# Patient Record
Sex: Female | Born: 1960 | Race: Black or African American | Hispanic: No | Marital: Single | Smoking: Former smoker
Health system: Southern US, Community
[De-identification: ages and names within clinical notes are randomized; demographics above are authoritative.]

## PROBLEM LIST (undated history)

## (undated) DIAGNOSIS — J449 Chronic obstructive pulmonary disease, unspecified: Secondary | ICD-10-CM

## (undated) DIAGNOSIS — E785 Hyperlipidemia, unspecified: Secondary | ICD-10-CM

## (undated) DIAGNOSIS — I10 Essential (primary) hypertension: Principal | ICD-10-CM

## (undated) DIAGNOSIS — M1990 Unspecified osteoarthritis, unspecified site: Secondary | ICD-10-CM

## (undated) HISTORY — DX: Unspecified osteoarthritis, unspecified site: M19.90

## (undated) HISTORY — DX: Chronic obstructive pulmonary disease, unspecified: J44.9

## (undated) HISTORY — PX: BREAST BIOPSY: SHX20

## (undated) HISTORY — PX: BREAST EXCISIONAL BIOPSY: SUR124

## (undated) HISTORY — DX: Essential (primary) hypertension: I10

## (undated) HISTORY — PX: AXILLARY LYMPH NODE BIOPSY: SHX5737

## (undated) HISTORY — PX: DIAGNOSTIC LAPAROSCOPY: SUR761

## (undated) HISTORY — DX: Hyperlipidemia, unspecified: E78.5

---

## 1998-06-02 ENCOUNTER — Other Ambulatory Visit: Admission: RE | Admit: 1998-06-02 | Discharge: 1998-06-02 | Payer: Self-pay | Admitting: Family Medicine

## 1999-03-13 ENCOUNTER — Encounter: Payer: Self-pay | Admitting: Internal Medicine

## 1999-03-13 ENCOUNTER — Ambulatory Visit (HOSPITAL_COMMUNITY): Admission: RE | Admit: 1999-03-13 | Discharge: 1999-03-13 | Payer: Self-pay | Admitting: Internal Medicine

## 2000-05-23 ENCOUNTER — Other Ambulatory Visit: Admission: RE | Admit: 2000-05-23 | Discharge: 2000-05-23 | Payer: Self-pay | Admitting: Family Medicine

## 2001-02-03 ENCOUNTER — Encounter: Payer: Self-pay | Admitting: Family Medicine

## 2001-02-03 ENCOUNTER — Encounter: Admission: RE | Admit: 2001-02-03 | Discharge: 2001-02-03 | Payer: Self-pay | Admitting: Family Medicine

## 2002-01-29 ENCOUNTER — Other Ambulatory Visit: Admission: RE | Admit: 2002-01-29 | Discharge: 2002-01-29 | Payer: Self-pay | Admitting: Family Medicine

## 2002-01-31 ENCOUNTER — Encounter: Payer: Self-pay | Admitting: Family Medicine

## 2002-01-31 ENCOUNTER — Encounter: Admission: RE | Admit: 2002-01-31 | Discharge: 2002-01-31 | Payer: Self-pay | Admitting: Family Medicine

## 2003-04-13 DIAGNOSIS — I10 Essential (primary) hypertension: Secondary | ICD-10-CM

## 2003-04-13 HISTORY — DX: Essential (primary) hypertension: I10

## 2003-06-17 ENCOUNTER — Emergency Department (HOSPITAL_COMMUNITY): Admission: AD | Admit: 2003-06-17 | Discharge: 2003-06-17 | Payer: Self-pay | Admitting: Family Medicine

## 2003-07-03 ENCOUNTER — Encounter: Admission: RE | Admit: 2003-07-03 | Discharge: 2003-07-03 | Payer: Self-pay | Admitting: Family Medicine

## 2003-07-11 ENCOUNTER — Other Ambulatory Visit: Admission: RE | Admit: 2003-07-11 | Discharge: 2003-07-11 | Payer: Self-pay | Admitting: Family Medicine

## 2003-12-31 ENCOUNTER — Ambulatory Visit: Payer: Self-pay | Admitting: Nurse Practitioner

## 2004-08-25 ENCOUNTER — Ambulatory Visit: Payer: Self-pay | Admitting: Nurse Practitioner

## 2005-02-05 ENCOUNTER — Ambulatory Visit: Payer: Self-pay | Admitting: Nurse Practitioner

## 2005-07-17 ENCOUNTER — Emergency Department (HOSPITAL_COMMUNITY): Admission: EM | Admit: 2005-07-17 | Discharge: 2005-07-17 | Payer: Self-pay | Admitting: Family Medicine

## 2005-07-19 ENCOUNTER — Ambulatory Visit: Payer: Self-pay | Admitting: Nurse Practitioner

## 2005-07-25 ENCOUNTER — Emergency Department (HOSPITAL_COMMUNITY): Admission: EM | Admit: 2005-07-25 | Discharge: 2005-07-25 | Payer: Self-pay | Admitting: Family Medicine

## 2005-08-10 ENCOUNTER — Encounter: Admission: RE | Admit: 2005-08-10 | Discharge: 2005-08-10 | Payer: Self-pay | Admitting: Family Medicine

## 2005-11-16 ENCOUNTER — Ambulatory Visit: Payer: Self-pay | Admitting: Nurse Practitioner

## 2005-11-16 ENCOUNTER — Encounter (INDEPENDENT_AMBULATORY_CARE_PROVIDER_SITE_OTHER): Payer: Self-pay | Admitting: *Deleted

## 2005-11-30 ENCOUNTER — Ambulatory Visit: Payer: Self-pay | Admitting: Nurse Practitioner

## 2006-07-06 ENCOUNTER — Ambulatory Visit: Payer: Self-pay | Admitting: Nurse Practitioner

## 2006-07-08 ENCOUNTER — Ambulatory Visit (HOSPITAL_COMMUNITY): Admission: RE | Admit: 2006-07-08 | Discharge: 2006-07-08 | Payer: Self-pay | Admitting: Family Medicine

## 2006-07-19 ENCOUNTER — Ambulatory Visit (HOSPITAL_COMMUNITY): Admission: RE | Admit: 2006-07-19 | Discharge: 2006-07-19 | Payer: Self-pay | Admitting: Family Medicine

## 2006-08-05 ENCOUNTER — Ambulatory Visit: Payer: Self-pay | Admitting: Nurse Practitioner

## 2006-08-16 ENCOUNTER — Encounter: Admission: RE | Admit: 2006-08-16 | Discharge: 2006-08-16 | Payer: Self-pay | Admitting: Family Medicine

## 2006-10-03 ENCOUNTER — Ambulatory Visit (HOSPITAL_COMMUNITY): Admission: RE | Admit: 2006-10-03 | Discharge: 2006-10-03 | Payer: Self-pay | Admitting: Nurse Practitioner

## 2006-11-17 ENCOUNTER — Ambulatory Visit: Payer: Self-pay | Admitting: Family Medicine

## 2006-11-17 ENCOUNTER — Encounter: Payer: Self-pay | Admitting: Family Medicine

## 2006-11-29 ENCOUNTER — Ambulatory Visit (HOSPITAL_COMMUNITY): Admission: RE | Admit: 2006-11-29 | Discharge: 2006-11-29 | Payer: Self-pay | Admitting: Obstetrics & Gynecology

## 2006-12-26 ENCOUNTER — Ambulatory Visit (HOSPITAL_COMMUNITY): Admission: RE | Admit: 2006-12-26 | Discharge: 2006-12-26 | Payer: Self-pay | Admitting: Family Medicine

## 2006-12-26 ENCOUNTER — Ambulatory Visit: Payer: Self-pay | Admitting: Family Medicine

## 2006-12-28 ENCOUNTER — Ambulatory Visit: Payer: Self-pay | Admitting: Internal Medicine

## 2007-01-11 ENCOUNTER — Ambulatory Visit: Payer: Self-pay | Admitting: Obstetrics and Gynecology

## 2007-01-12 ENCOUNTER — Ambulatory Visit: Payer: Self-pay | Admitting: Family Medicine

## 2007-02-13 ENCOUNTER — Ambulatory Visit: Payer: Self-pay | Admitting: Family Medicine

## 2007-02-27 ENCOUNTER — Ambulatory Visit: Payer: Self-pay | Admitting: Internal Medicine

## 2007-05-26 ENCOUNTER — Ambulatory Visit: Payer: Self-pay | Admitting: Family Medicine

## 2007-07-25 ENCOUNTER — Ambulatory Visit: Payer: Self-pay | Admitting: Internal Medicine

## 2007-10-02 ENCOUNTER — Encounter: Admission: RE | Admit: 2007-10-02 | Discharge: 2007-10-02 | Payer: Self-pay | Admitting: Occupational Therapy

## 2007-11-07 ENCOUNTER — Ambulatory Visit: Payer: Self-pay | Admitting: Internal Medicine

## 2007-11-08 ENCOUNTER — Encounter (INDEPENDENT_AMBULATORY_CARE_PROVIDER_SITE_OTHER): Payer: Self-pay | Admitting: Family Medicine

## 2007-11-08 LAB — CONVERTED CEMR LAB: Barbiturate Quant, Ur: NEGATIVE

## 2008-01-10 ENCOUNTER — Ambulatory Visit: Payer: Self-pay | Admitting: *Deleted

## 2008-01-22 ENCOUNTER — Ambulatory Visit: Payer: Self-pay | Admitting: Internal Medicine

## 2008-08-15 ENCOUNTER — Ambulatory Visit: Payer: Self-pay | Admitting: Internal Medicine

## 2008-08-19 ENCOUNTER — Ambulatory Visit: Payer: Self-pay | Admitting: Family Medicine

## 2008-10-21 ENCOUNTER — Ambulatory Visit: Payer: Self-pay | Admitting: Internal Medicine

## 2008-10-28 ENCOUNTER — Ambulatory Visit: Payer: Self-pay | Admitting: Internal Medicine

## 2008-10-31 ENCOUNTER — Encounter: Admission: RE | Admit: 2008-10-31 | Discharge: 2008-10-31 | Payer: Self-pay | Admitting: Family Medicine

## 2009-02-21 ENCOUNTER — Encounter (INDEPENDENT_AMBULATORY_CARE_PROVIDER_SITE_OTHER): Payer: Self-pay | Admitting: Internal Medicine

## 2009-02-21 ENCOUNTER — Ambulatory Visit: Payer: Self-pay | Admitting: Internal Medicine

## 2009-02-21 LAB — CONVERTED CEMR LAB
ALT: 12 units/L (ref 0–35)
Alkaline Phosphatase: 88 units/L (ref 39–117)
BUN: 19 mg/dL (ref 6–23)
Chloride: 105 meq/L (ref 96–112)
Creatinine, Ser: 0.84 mg/dL (ref 0.40–1.20)
Potassium: 4.5 meq/L (ref 3.5–5.3)
Total Protein: 7.4 g/dL (ref 6.0–8.3)

## 2009-06-19 ENCOUNTER — Ambulatory Visit: Payer: Self-pay | Admitting: Internal Medicine

## 2009-09-24 ENCOUNTER — Ambulatory Visit: Payer: Self-pay | Admitting: Internal Medicine

## 2009-11-28 ENCOUNTER — Encounter: Admission: RE | Admit: 2009-11-28 | Discharge: 2009-11-28 | Payer: Self-pay | Admitting: Family Medicine

## 2010-03-24 ENCOUNTER — Ambulatory Visit: Payer: Self-pay | Admitting: Family Medicine

## 2010-05-03 ENCOUNTER — Encounter: Payer: Self-pay | Admitting: *Deleted

## 2010-08-25 NOTE — Op Note (Signed)
NAMECORALEE, [name]               ACCOUNT NO.:  [id number]   MEDICAL RECORD NO.:  [id number]          PATIENT TYPE:  AMB   LOCATION:  [facility]                           [facility]:  [facility]   PHYSICIAN:  [name], M.D.   DATE OF BIRTH:  03/07/1961   DATE OF PROCEDURE:  12/26/2006  DATE OF DISCHARGE:                               OPERATIVE REPORT   PREOPERATIVE DIAGNOSIS:  Persistent ovarian cyst on the right.   POSTOPERATIVE DIAGNOSES:  1. Persistent ovarian cyst on the right.  2. Extensive pelvic adhesive disease.   SURGEON:  [name]. Shawnie Pons, MD.   ASSISTANTMichele Harvey D. [name], M.D.   PROCEDURE:  Diagnostic laparoscopy.   ANESTHESIA:  General.   FINDINGS:  Extensive adhesions and a large ovarian cyst.   SPECIMENS:  None.   ESTIMATED BLOOD LOSS:  Minimal.   COMPLICATIONS:  None immediately known.   REASON FOR PROCEDURE:  Briefly the patient is a 50 year old, gravida 3,  para 2-0-1-2, who has a history of a previous C-section and a lumpectomy  of the breast, but no other abdominal surgeries, who had a persistent  ovarian cyst initially found on an MRI and then a followup ultrasound  revealed that it was still there.  She is having a little bit of pain on  the right side and desired definitive treatment.  Although the cyst  appeared simple, the fact that it was persistent led [place] to take her to  diagnostic laparoscopy.  Additionally the patient is a smoker and a bit  of a chronic alcohol user.   PROCEDURE:  The patient was taken to the OR, she was placed in dorsal  lithotomy in Allen stirrups, she was prepped and draped in the usual  sterile fashion.  A catheter was used to drain her bladder, a speculum  was then placed inside the vagina, the cervix was visualized and grasped  with a single tooth tenaculum anteriorly, and a Hulka tenaculum was  placed, the single tooth tenaculum and the speculum were removed from  the vagina.  Attention was then turned to the abdomen, the  umbilicus was  injected with 5 ml of 0.25% Marcaine plain, a knife was used to make an  incision through the umbilicus, was carried down until the peritoneal  cavity was entered sharply.  A ___________ suture on a UR-6 was then  used to tag either side of the fascia of this incision, and a Hassan  trocar was placed through the incision.  A pneumoperitoneum was created  and the scope was used to look in.  Immediately upon entering the  abdomen, a dense set of adhesions of omentum were noted to be all along  the anterior abdominal wall.  This extended from the umbilicus all the  way down to the pelvis.  By going to one side of this adhesion on the  left, the patient's uterus left tube, which had previously had a tubal  ligation and the left ovary could be seen following the underside of the  uterus, eventually the other ovary was seen and was noted to be fairly  enlarged, but the bowel was directly below it and really could not be  raised up much above that and there was no safe way to remove this  ovary.  Additionally the ovary was attached anteriorly to the anterior  abdominal wall and possibly to the uterus as well, the pelvic sidewall  could not be sufficiently seen to be sure that this was clear nor could  the ureter have been identified on the side.  For all these reasons, the  decision was made to abort the procedure.  The patient had not  previously been consented for exploratory laparotomy, it was unclear how  long she had asked off for work and how much time would be available to  her.  Additionally it was felt that a hysterectomy may be in her best  interest given the amount of adhesions and her undesired fertility,  which could be done did we have time to prep for this.  Additionally  given the location of bowel, the density of the adhesions, it was felt  that the patient would best be served with a prior bowel prep as well as  a getting a PT-PTT given her history of chronic  alcohol use and just  making sure that everything else was normal before undergoing major  surgery, which would include the availability of blood products as well  as clotting factors if necessary.  Once this decision was made, the  pneumoperitoneum was let down, the [facility] trocar and all instruments  were removed from the abdomen, the fascia at the umbilicus was closed  with two figure-of-eight, the skin was closed with a #4-0 Vicryl in a  running subcuticular fashion.  All instrument and lap counts were  correct x2.  The Hulka tenaculum was removed from the vagina, the  patient was awakened and taken to recovery in stable condition.      [name]. Shawnie Harvey, M.D.  Electronically Signed     TSP/MEDQ  D:  12/26/2006  T:  12/26/2006  Job:  [id number]

## 2010-08-25 NOTE — Group Therapy Note (Signed)
[name], [name] NO.:  [id number]   MEDICAL RECORD NO.:  [id number]          PATIENT TYPE:  WOC   LOCATION:  [facility]                   FACILITY:  [facility]   PHYSICIAN:  Argentina Donovan, MD        DATE OF BIRTH:  05/08/1960   DATE OF SERVICE:  01/11/2007                                  CLINIC NOTE   The patient is a 50 year old African American female gravida 3, para 2-0-  1-2 who underwent laparoscopic examination on September 15 for  persistent ovarian cyst.  The patient had extensive pelvic adhesions  with a simple cyst on the ovary, and the patient had not been consulted  about further surgery.  We decided to abort the procedure at that point.  The patient has healed up well.  She is completely asymptomatic.  I told  her that, unless she became symptomatic, that she really should not  consider anything, but whoever has to go in there should do it through a  laparotomy.   IMPRESSION:  Persistent ovarian cyst.           ______________________________  Argentina Donovan, MD     PR/MEDQ  D:  01/11/2007  T:  01/12/2007  Job:  [id number]

## 2010-08-25 NOTE — Group Therapy Note (Signed)
NAMEBRAYLYNN, [name]               ACCOUNT NO.:  [id number]   MEDICAL RECORD NO.:  [id number]          PATIENT TYPE:  WOC   LOCATION:  [facility]                   FACILITY:  [facility]   PHYSICIAN:  Tinnie Gens, MD        DATE OF BIRTH:  1960/09/24   DATE OF SERVICE:  11/17/2006                                  CLINIC NOTE   CHIEF COMPLAINT:  Evaluate and treat adnexal cyst.   HISTORY OF PRESENT ILLNESS:  The patient is a 50 year old gravida 3,  para 2-0-1-2 who is referred from the outpatient clinic for a persistent  ovarian cyst.  Apparently the patient underwent MRI.  The cyst was found  on the right side, and she had a follow-up ultrasound.  The cyst was  persistent for approximately 2-3 months so she was referred here.  The  cyst looks simple in nature, but there may be a more complex right  adnexal mass.   PAST MEDICAL HISTORY:  Hypertension.   PAST SURGICAL HISTORY:  C-section x1, breast and axillary lumpectomy.   MEDICATIONS:  Lisinopril one daily.   ALLERGIES:  None known __________  .   OBSTETRICAL HISTORY:  She is a G3, P2, one miscarriage, one vaginal  delivery, one C-section __________  fetal heart rate tracing.   GYNECOLOGICAL HISTORY:  Menarche at age 81.  Cycles have been regular in  the past, but over the last six months she has skipped some months  without having a period.  Her cycles have become lighter.  She is having  some hot flashes as well.  She uses condoms for birth control.  She has  history of abnormal Pap in 1987, but her last Pap was July of last year  and was normal.  She had a mammogram in April 2008 also normal.   FAMILY HISTORY:  Diabetes, heart disease, prostate cancer in her father.   SOCIAL HISTORY:  She works for [employer] in [employer].  She  does smoke approximately 1/2 pack per day for the last 25 years.  She  does drink alcohol approximately three beers a day; no other drug use.   REVIEW OF SYSTEMS:  __________  , please see  GYN history on the chart.  Positive for night sweats and hot flashes.   PHYSICAL EXAMINATION:  GENERAL:  She is a thin black female in no acute  distress.  Looks stated age.  HEENT:  Normocephalic, atraumatic.  Sclerae anicteric.  NECK:  Supple.  Normal thyroid.  LUNGS:  Clear bilaterally.  CV:  Regular rate and rhythm.  No murmurs, rubs, gallops.  ABDOMEN:  Soft, nontender, nondistended.  EXTREMITIES:  No cyanosis, clubbing or edema, 2+ distal pulses.  GU:  Normal external female genitalia, BUS was normal.  Vagina is pink  and rugated.  Cervix is nulliparous in appearance without lesion.  Uterus is small, anteverted, left adnexa is without mass or tenderness;  right adnexa is without tenderness, but there is fullness on that side.   RADIOLOGY REVIEW:  Pelvic sonogram from October 03, 2006 reveals a simple  right adnexal cyst.  This is 6.8  x 3.7 x 3.7 cm and a second slightly  complex area of 2.1 x 2.2 x 2.8 cm.  Mesenteric cyst is a consideration.   IMPRESSION:  1. Persistent adnexal cyst.  2. Hypertension.  3. Tobacco use.  4. Current alcohol use.   PLAN:  Will plan for follow-up ultrasound in two weeks followed by  diagnostic laparoscopy with removal of said cyst and possibly removal of  that right ovary depending on what it looks like.  Would probably send  assessment for frozen if worrisome.           ______________________________  Tinnie Gens, MD     TP/MEDQ  D:  11/17/2006  T:  11/18/2006  Job:  [id number]

## 2010-11-03 ENCOUNTER — Other Ambulatory Visit: Payer: Self-pay | Admitting: Family Medicine

## 2010-11-03 DIAGNOSIS — Z1231 Encounter for screening mammogram for malignant neoplasm of breast: Secondary | ICD-10-CM

## 2010-12-03 ENCOUNTER — Ambulatory Visit: Payer: Self-pay

## 2010-12-07 ENCOUNTER — Ambulatory Visit: Payer: Self-pay

## 2011-01-21 LAB — CBC
HCT: 35 — ABNORMAL LOW
RBC: 3.49 — ABNORMAL LOW
WBC: 5.8

## 2011-01-21 LAB — COMPREHENSIVE METABOLIC PANEL
Albumin: 4
Chloride: 106
GFR calc Af Amer: >60
GFR calc non Af Amer: >60
Glucose, Bld: 108 — ABNORMAL HIGH
Potassium: 4.1

## 2011-03-30 ENCOUNTER — Other Ambulatory Visit: Payer: Self-pay | Admitting: Family Medicine

## 2012-03-08 ENCOUNTER — Encounter: Payer: Self-pay | Admitting: Family Medicine

## 2012-03-08 ENCOUNTER — Ambulatory Visit (INDEPENDENT_AMBULATORY_CARE_PROVIDER_SITE_OTHER): Payer: BC Managed Care – PPO | Admitting: Family Medicine

## 2012-03-08 VITALS — BP 125/69 | HR 90 | Temp 98.1°F | Ht 68.0 in | Wt 112.6 lb

## 2012-03-08 DIAGNOSIS — I10 Essential (primary) hypertension: Secondary | ICD-10-CM

## 2012-03-08 DIAGNOSIS — M79673 Pain in unspecified foot: Secondary | ICD-10-CM | POA: Insufficient documentation

## 2012-03-08 DIAGNOSIS — M79609 Pain in unspecified limb: Secondary | ICD-10-CM

## 2012-03-08 DIAGNOSIS — M542 Cervicalgia: Secondary | ICD-10-CM | POA: Insufficient documentation

## 2012-03-08 MED ORDER — CYCLOBENZAPRINE HCL 10 MG PO TABS: 10.0000 mg | ORAL_TABLET | Freq: Every evening | ORAL | Status: DC | PRN

## 2012-03-08 MED ORDER — LISINOPRIL-HYDROCHLOROTHIAZIDE 20-25 MG PO TABS: 1.0000 | ORAL_TABLET | Freq: Every day | ORAL | Status: DC

## 2012-03-08 NOTE — Progress Notes (Signed)
Patient ID: [name], female   DOB: February 11, 1961, 51 y.o.   MRN: [id number] Subjective: The patient is a 52 y.o. year old female who presents today for new patient appointment.  1. Neck stiffness:  Last 3-4 months has been noticing.  Tightness in back of neck, more on right.  Worse at night.  Made somewhat better by tylenol.  No radiation into arms, no numbness or tingling.  2. Foot pain:  Bilateral feet.  Present daily, worse with standing.  Not present when wakes up.  No numbness or tingling.  No weakness.  3. Blood pressure:  Takes meds daily.  No cp/sob.  Needs refills on meds.  Denies any LE edema.  Patient's past medical, social, and family history were reviewed and updated as appropriate. History  Substance Use Topics  . Smoking status: Not on file  . Smokeless tobacco: Not on file  . Alcohol Use: Not on file   Objective:  Filed Vitals:   03/08/12 1634  BP: 125/69  Pulse: 90  Temp: 98.1 F (36.7 C)   Gen: NAD, appropriate throughout exam HEENT: Neck is without any obvious muscle spasm at this time.  Mildly tender throughout. CV: RRR, no murmurs Resp: CTABL Ext: No edema, strength 5/5 all 4 extremities.  No decrease in sensation any extremity.  Assessment/Plan:  Please also see individual problems in problem list for problem-specific plans.

## 2012-03-08 NOTE — Patient Instructions (Signed)
Make an appointment to get your labs drawn first thing in the morning some morning when you can manage it with work. Try to stretch your neck throughout the day. I am giving you a muscle relaxer for days when your neck is particularly bad.  Do not take it and drive, do not take it with alcohol, and do not take it at work as it will make you sleepy.

## 2012-03-20 NOTE — Assessment & Plan Note (Signed)
Well controlled currently.  Refill meds.

## 2012-03-20 NOTE — Assessment & Plan Note (Signed)
Unclear etiology and, currently, patient is more concerned with neck pain.  Foot pain is not caused by cellulitis, does not appear to related to DVT, and is not related to acute nerve compression so can defer this until a later date.

## 2012-03-20 NOTE — Assessment & Plan Note (Signed)
Likely MSK in origin.  Discussed appropriate neck position when sleeping, periodic stretching, and avoiding being in the same position for extended periods of time.  Flexeril for pain in the evening.  Discussed side effect of sedation with patient.

## 2012-04-13 ENCOUNTER — Other Ambulatory Visit: Payer: BC Managed Care – PPO

## 2012-04-13 DIAGNOSIS — I10 Essential (primary) hypertension: Secondary | ICD-10-CM

## 2012-04-13 NOTE — Progress Notes (Signed)
BMP,CBC WITH DIFF,TSH AND FLP DONE TODAY [name]

## 2012-04-14 LAB — CBC WITH DIFFERENTIAL/PLATELET
Basophils Relative: 1 % (ref 0–1)
Eosinophils Absolute: 0.1 10*3/uL (ref 0.0–0.7)
Eosinophils Relative: 1 % (ref 0–5)
HCT: 35.8 % — ABNORMAL LOW (ref 36.0–46.0)
Hemoglobin: 12 g/dL (ref 12.0–15.0)
MCH: 33.1 pg (ref 26.0–34.0)
MCHC: 33.5 g/dL (ref 30.0–36.0)
Monocytes Absolute: 0.5 10*3/uL (ref 0.1–1.0)
Monocytes Relative: 9 % (ref 3–12)

## 2012-04-14 LAB — LIPID PANEL
Cholesterol: 212 mg/dL — ABNORMAL HIGH (ref 0–200)
Total CHOL/HDL Ratio: 2.5 Ratio
VLDL: 12 mg/dL (ref 0–40)

## 2012-04-14 LAB — BASIC METABOLIC PANEL
CO2: 27 mEq/L (ref 19–32)
Chloride: 98 mEq/L (ref 96–112)
Sodium: 136 mEq/L (ref 135–145)

## 2012-04-24 ENCOUNTER — Encounter: Payer: Self-pay | Admitting: Family Medicine

## 2012-12-17 ENCOUNTER — Emergency Department (INDEPENDENT_AMBULATORY_CARE_PROVIDER_SITE_OTHER): Payer: BC Managed Care – PPO

## 2012-12-17 ENCOUNTER — Emergency Department (INDEPENDENT_AMBULATORY_CARE_PROVIDER_SITE_OTHER)
Admission: EM | Admit: 2012-12-17 | Discharge: 2012-12-17 | Disposition: A | Discharge status: Home / Self Care | Payer: BC Managed Care – PPO | Source: Home / Self Care | Attending: Emergency Medicine | Admitting: Emergency Medicine

## 2012-12-17 ENCOUNTER — Encounter (HOSPITAL_COMMUNITY): Payer: Self-pay | Admitting: *Deleted

## 2012-12-17 DIAGNOSIS — M7581 Other shoulder lesions, right shoulder: Secondary | ICD-10-CM

## 2012-12-17 DIAGNOSIS — M67919 Unspecified disorder of synovium and tendon, unspecified shoulder: Secondary | ICD-10-CM

## 2012-12-17 MED ORDER — IBUPROFEN 800 MG PO TABS
ORAL_TABLET | ORAL | Status: AC
  Filled 2012-12-17: qty 1

## 2012-12-17 MED ORDER — MELOXICAM 15 MG PO TABS: 15.0000 mg | ORAL_TABLET | Freq: Every day | ORAL | Status: DC

## 2012-12-17 MED ORDER — IBUPROFEN 800 MG PO TABS
800.0000 mg | ORAL_TABLET | Freq: Once | ORAL | Status: AC
  Administered 2012-12-17: 800 mg via ORAL

## 2012-12-17 MED ORDER — OXYCODONE-ACETAMINOPHEN 5-325 MG PO TABS: ORAL_TABLET | ORAL | Status: DC

## 2012-12-17 NOTE — ED Notes (Signed)
Pt reports  Pain  r  Arm  From the  Shoulder  Down  X  sev  Days    Pt  denys  Any  Injury  She  States  She  Woke  Up  With  The  Pain

## 2012-12-17 NOTE — ED Notes (Signed)
Med  r   Arm  Sling  applied 

## 2012-12-17 NOTE — ED Provider Notes (Signed)
Chief Complaint:   Chief Complaint  Patient presents with  . 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 She S. with a three-day history of pain that seems centered around her right shoulder radiates up into the trapezius ridge and down her entire arm as far as the palm of the hand. She denies any swelling of the arm. The palm of her hand feels numb and tingly in the whole arm feels weak. There is pain on movement of the shoulder, abduction, flexion, and internal and external rotation. She denies any injury. She does lift heavy packages at work. The pain is worse with any movement of her shoulder and also if she lies on her side. Her neck has good range of motion with minimal pain. She has minor pain in her volar wrist and the palm of the hand. She denies ever having anything like this before. No history of arthritis, tendinitis, or bursitis.  Review of Systems:  Other than noted above, the patient denies any of the following symptoms: Systemic:  No fevers, chills, sweats, or aches.  No fatigue or tiredness. Musculoskeletal:  No joint pain, arthritis, bursitis, swelling, back pain, or neck pain. Neurological:  No muscular weakness, paresthesias, headache, or trouble with speech or coordination.  No dizziness.  PMFSH:  Past medical history, family history, social history, meds, and allergies were reviewed.  She has high blood pressure and takes lisinopril.  Physical Exam:   Vital signs:  BP 142/86  Pulse 72  Temp(Src) 98.6 F (37 C) (Oral)  Resp 16  SpO2 100% Gen:  Alert and oriented times 3.  In no distress. Musculoskeletal: There is pain to palpation in the trapezius ridge, over the anterior and posterior aspects of the shoulder. Then shoulder has a limited range of motion with pain on abduction, flexion, and internal and external rotation. There was no swelling of the arm. There is no pain to palpation in  the upper arm, elbow, antecubital fossa, or forearm. She does have some pain to palpation over the carpal tunnel and over the palm of the hand. Neer test was positive.  Hawkins test was positive.  Empty cans test was positive. Otherwise, all joints had a full a ROM with no swelling, bruising or deformity.  No edema, pulses full. Extremities were warm and pink.  Capillary refill was brisk.  Skin:  Clear, warm and dry.  No rash. Neuro:  Alert and oriented times 3.  Muscle strength was normal.  Sensation was intact to light touch.   Radiology:  Dg Shoulder Right  12/17/2012   *RADIOLOGY REPORT*  Clinical Data: Right shoulder pain for 3 days, no known injury  RIGHT SHOULDER - 2+ VIEW  Comparison: None.  Findings: No fracture or dislocation.  Glenohumeral and acromioclavicular joint spaces are preserved.  No evidence of calcific tendonitis.  Limited visualization adjacent thorax is normal.  Regional soft tissues are normal.  IMPRESSION: Normal radiographs the right shoulder.   Original Report Authenticated By: Tacey Ruiz, MD   I reviewed the images independently and personally and concur with the radiologist's findings.  Course in Urgent Care Center:   Placed in a sling. Suggested she wear this for only about 3 days, and given a note for work for 3 days. Thereafter returned but no heavy lifting.  Assessment:  The encounter diagnosis was Rotator cuff tendonitis, right.  Differential diagnosis includes carpal  tunnel syndrome, cervical radiculopathy, or bursitis.  Plan:   1.  The following meds were prescribed:   New Prescriptions   MELOXICAM (MOBIC) 15 MG TABLET    Take 1 tablet (15 mg total) by mouth daily.   OXYCODONE-ACETAMINOPHEN (PERCOCET) 5-325 MG PER TABLET    1 to 2 tablets every 6 hours as needed for pain.   2.  The patient was instructed in symptomatic care, including rest and activity, elevation, application of ice and compression.  Appropriate handouts were given. Suggested she start  exercises in 3 days, return to work in 3 days, and followup with orthopedics if no better in 2 weeks. 3.  The patient was told to return if becoming worse in any way, if no better in 3 or 4 days, and given some red flag symptoms such as worsening pain that would indicate earlier return.   4.  The patient was told to follow up with Dr. Marcene Corning if no better in 2 weeks.    Reuben Likes, MD 12/17/12 1013

## 2013-01-09 ENCOUNTER — Encounter: Payer: Self-pay | Admitting: Family Medicine

## 2013-01-09 ENCOUNTER — Ambulatory Visit (INDEPENDENT_AMBULATORY_CARE_PROVIDER_SITE_OTHER): Payer: BC Managed Care – PPO | Admitting: Family Medicine

## 2013-01-09 VITALS — BP 131/70 | HR 101 | Temp 98.7°F | Ht 68.0 in | Wt 118.4 lb

## 2013-01-09 DIAGNOSIS — Z0000 Encounter for general adult medical examination without abnormal findings: Secondary | ICD-10-CM

## 2013-01-09 DIAGNOSIS — I10 Essential (primary) hypertension: Secondary | ICD-10-CM

## 2013-01-09 NOTE — Patient Instructions (Addendum)
Hey Ms. Fitzhenry, it was a pleasure meeting you today. We are going to stop your lisinopril-hydrochlorothiazide, which you are taking for blood pressure. Please check your blood pressure in two weeks at a pharmacy or fire department. If your values are over 140/90, please call the office to let me know. You also got your blood drawn today to check an LDL to see if we need to start you on cholesterol medication. Please schedule a visit for one month from today. Thanks!  Sincerely,  Jacquelin Hawking, MD

## 2013-01-12 ENCOUNTER — Other Ambulatory Visit: Payer: Self-pay | Admitting: Family Medicine

## 2013-01-13 MED ORDER — LISINOPRIL-HYDROCHLOROTHIAZIDE 20-25 MG PO TABS: 1.0000 | ORAL_TABLET | Freq: Every day | ORAL | Status: DC

## 2013-01-13 NOTE — Telephone Encounter (Signed)
Medication refill fax request returned to pharmacy.

## 2013-01-14 NOTE — Progress Notes (Signed)
  Subjective:    Patient ID: [name], female    DOB: 1961/03/21, 52 y.o.   MRN: [id number]  HPI Comments: [name] comes in today for medication refills. She is currently not compliant with medication because she feels she doesn't need them. She no other complaints. She has currently smokes 6 cigarettes per day but used to smoke 1PPD two years ago.      Review of Systems  Constitutional: Negative.   HENT: Negative.   Respiratory: Negative.   Cardiovascular: Negative.   Gastrointestinal: Negative.        Objective:   Physical Exam  Constitutional: She appears well-developed and well-nourished. No distress.  Cardiovascular: Normal rate, regular rhythm, normal heart sounds and intact distal pulses.   No murmur heard. Pulmonary/Chest: Effort normal and breath sounds normal. No respiratory distress. She has no wheezes. She has no rales.  Abdominal: Soft. Bowel sounds are normal. She exhibits no distension. There is no tenderness. There is no rebound and no guarding.  Skin: She is not diaphoretic.          Assessment & Plan:

## 2013-01-14 NOTE — Assessment & Plan Note (Signed)
BP today is controlled and she is currently not compliant with medication. I gave Ms. [name] the option to stop medication and to check at a pharmacy or fire station. If greater than 140/90, to call me. Otherwise, she will follow-up in one month. She will follow-up to get a direct LDL because of a previous lipid panel that was abnormal.

## 2013-01-30 ENCOUNTER — Ambulatory Visit (INDEPENDENT_AMBULATORY_CARE_PROVIDER_SITE_OTHER): Payer: BC Managed Care – PPO | Admitting: Family Medicine

## 2013-01-30 ENCOUNTER — Encounter: Payer: Self-pay | Admitting: Family Medicine

## 2013-01-30 VITALS — BP 143/89 | HR 88 | Temp 98.9°F | Ht 68.0 in | Wt 120.6 lb

## 2013-01-30 DIAGNOSIS — I10 Essential (primary) hypertension: Secondary | ICD-10-CM

## 2013-01-30 MED ORDER — HYDROCHLOROTHIAZIDE 25 MG PO TABS: 25.0000 mg | ORAL_TABLET | Freq: Every day | ORAL | Status: DC

## 2013-01-30 NOTE — Assessment & Plan Note (Signed)
Restarted patient on HCTZ only since her BP readings are no more than 145/95.  Discussed signs that should prompt re-evaluation otherwise followup with his primary doctor in 2-3 weeks.

## 2013-01-30 NOTE — Progress Notes (Signed)
Family Medicine Office Visit Note   Subjective:   Patient ID: [name], female  DOB: 1961/03/20, 52 y.o.. MRN: [id number]   Pt that comes today complaining of elevated blood pressure readings. She reports was on HCTZ/lisinopril and recently took off the medication because her blood pressure where within normal limits besides noncompliance. Ports for the past week she has had couple of episodes with blood pressures above 140/90. Pt denies SOB, chest pain, palpitations, headaches, dizziness, numbness or weakness.  Review of Systems:  Per history of present illness  Objective:   Physical Exam: Gen:  NAD HEENT: Moist mucous membranes  CV: Regular rate and rhythm, no murmurs rubs or gallops PULM: Clear to auscultation bilaterally. No wheezes/rales/rhonchi ABD: Soft, non tender, non distended, normal bowel sounds EXT: No edema Neuro: Alert and oriented x3. No focalization  Assessment & Plan:

## 2013-01-30 NOTE — Patient Instructions (Signed)
Your blood pressure slightly elevated today. I have prescribed you HCTZ by itself please take one tablet daily. Check her blood pressures every other day and follow up with your primary doctor in 2-3 weeks or sooner if needed.

## 2013-06-11 ENCOUNTER — Ambulatory Visit (INDEPENDENT_AMBULATORY_CARE_PROVIDER_SITE_OTHER): Payer: BC Managed Care – PPO | Admitting: Family Medicine

## 2013-06-11 VITALS — BP 139/86 | HR 108 | Ht 68.0 in | Wt 118.6 lb

## 2013-06-11 DIAGNOSIS — M25569 Pain in unspecified knee: Secondary | ICD-10-CM

## 2013-06-11 DIAGNOSIS — Z0000 Encounter for general adult medical examination without abnormal findings: Secondary | ICD-10-CM

## 2013-06-11 DIAGNOSIS — M25519 Pain in unspecified shoulder: Secondary | ICD-10-CM

## 2013-06-11 DIAGNOSIS — I10 Essential (primary) hypertension: Secondary | ICD-10-CM

## 2013-06-11 LAB — CBC WITH DIFFERENTIAL/PLATELET
BASOS ABS: 0 10*3/uL (ref 0.0–0.1)
BASOS PCT: 0 % (ref 0–1)
EOS ABS: 0.1 10*3/uL (ref 0.0–0.7)
EOS PCT: 1 % (ref 0–5)
HEMATOCRIT: 37.2 % (ref 36.0–46.0)
HEMOGLOBIN: 12.6 g/dL (ref 12.0–15.0)
Lymphocytes Relative: 39 % (ref 12–46)
Lymphs Abs: 2 10*3/uL (ref 0.7–4.0)
MCH: 33.2 pg (ref 26.0–34.0)
MCHC: 33.9 g/dL (ref 30.0–36.0)
MCV: 98.2 fL (ref 78.0–100.0)
MONO ABS: 0.4 10*3/uL (ref 0.1–1.0)
MONOS PCT: 8 % (ref 3–12)
NEUTROS ABS: 2.7 10*3/uL (ref 1.7–7.7)
Neutrophils Relative %: 52 % (ref 43–77)
Platelets: 365 10*3/uL (ref 150–400)
RBC: 3.79 MIL/uL — ABNORMAL LOW (ref 3.87–5.11)
RDW: 13.3 % (ref 11.5–15.5)
WBC: 5.2 10*3/uL (ref 4.0–10.5)

## 2013-06-11 LAB — COMPREHENSIVE METABOLIC PANEL
ALK PHOS: 76 U/L (ref 39–117)
ALT: 13 U/L (ref 0–35)
AST: 22 U/L (ref 0–37)
Albumin: 5 g/dL (ref 3.5–5.2)
BUN: 16 mg/dL (ref 6–23)
CALCIUM: 10.2 mg/dL (ref 8.4–10.5)
CHLORIDE: 102 meq/L (ref 96–112)
CO2: 28 mEq/L (ref 19–32)
CREATININE: 0.87 mg/dL (ref 0.50–1.10)
Glucose, Bld: 94 mg/dL (ref 70–99)
Potassium: 4.3 mEq/L (ref 3.5–5.3)
Sodium: 141 mEq/L (ref 135–145)
Total Bilirubin: 0.4 mg/dL (ref 0.2–1.2)
Total Protein: 7.8 g/dL (ref 6.0–8.3)

## 2013-06-11 MED ORDER — IBUPROFEN 600 MG PO TABS: 600.0000 mg | ORAL_TABLET | Freq: Three times a day (TID) | ORAL | Status: DC | PRN

## 2013-06-11 MED ORDER — HYDROCHLOROTHIAZIDE 25 MG PO TABS: 25.0000 mg | ORAL_TABLET | Freq: Every day | ORAL | Status: DC

## 2013-06-11 NOTE — Assessment & Plan Note (Signed)
Nerve impingement vs sciatica. Will watch for now and have patient follow-up. If does not improve with nsaid, will assess further.

## 2013-06-11 NOTE — Progress Notes (Signed)
   Subjective:    Patient ID: [name], female    DOB: 29-Dec-1960, 53 y.o.   MRN: [id number]  HPI  Hypertension  Patient here for follow-up of elevated blood pressure. She currently takes hydrochlorothiazide and is adherent to treatment. Blood pressure is not well controlled when checked at the pharmacy. Cardiac symptoms: none. Patient denies: chest pain, chest pressure/discomfort, lower extremity edema, orthopnea and palpitations. Cardiovascular risk factors: hypertension. Use of agents associated with hypertension: none. History of target organ damage: none.  Shoulder pain Patient has a 5 month history of intermittent right shoulder tingling. She has been worked up and told she had tendonitis. Her symptoms are worse when she works and better when she's not working. 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 She takes tylenol, which helps a little with symptoms.  Leg pain Patient has a history of right leg tingling that is felt at the back of her thigh to her feet. It is worse when she stands up and improves when she sits.   Review of Systems Please refer to HPI    Objective:   Physical Exam  Constitutional: She is oriented to person, place, and time. She appears well-developed and well-nourished.  Cardiovascular: Normal rate, regular rhythm, normal heart sounds and intact distal pulses.   Pulmonary/Chest: Effort normal and breath sounds normal. No respiratory distress.  Musculoskeletal: She exhibits no edema.  Empty can test negative Apley scratch test negative Straight leg raise negative  Neurological: She is alert and oriented to person, place, and time.  Skin: Skin is warm.       Assessment & Plan:

## 2013-06-11 NOTE — Patient Instructions (Signed)
[name], it was a pleasure seeing you today. Today we talked about your blood pressure and your arm pain. Your blood pressure seems to be well controlled. Great job. Regarding your arm pain, it seems related to the type of work that you do. I have prescribed you some ibuprofen for you to take as directed. Please see me in one month, or sooner if needed, so we can readdress any issues you may continue to have. At that time, we can also go through some yearly questions since you are due for a physical.   If you have any questions or concerns, please do not hesitate to call the office at [phone].  Sincerely,  Cordelia Poche, MD

## 2013-06-11 NOTE — Assessment & Plan Note (Signed)
Blood pressure controlled. Will continue current regimen.

## 2013-06-11 NOTE — Assessment & Plan Note (Signed)
Most likely tendonitis. Advised patient to vary motions when working and to alternate arm use. Will treat with ibuprofen and follow-up. Will obtain labs to assess kidney function.

## 2013-06-12 ENCOUNTER — Ambulatory Visit: Payer: BC Managed Care – PPO | Admitting: Family Medicine

## 2014-01-10 ENCOUNTER — Other Ambulatory Visit: Payer: Self-pay | Admitting: Family Medicine

## 2014-01-11 NOTE — Telephone Encounter (Signed)
LVM for patient to call back. She will need to make an appointment to see Dr. Lonny Prude for refills and annual exam.

## 2014-01-16 NOTE — Telephone Encounter (Signed)
Patient instructed to make an appointment to be seen. Will supply refill for one month until then.

## 2014-02-11 ENCOUNTER — Encounter: Payer: Self-pay | Admitting: Family Medicine

## 2014-03-05 ENCOUNTER — Other Ambulatory Visit: Payer: Self-pay | Admitting: Family Medicine

## 2014-03-05 DIAGNOSIS — I10 Essential (primary) hypertension: Secondary | ICD-10-CM

## 2014-03-05 NOTE — Telephone Encounter (Signed)
Pt called and needs a refill on her BP medication called in. She has only 2 pills left. jw

## 2014-03-06 MED ORDER — HYDROCHLOROTHIAZIDE 25 MG PO TABS: 25.0000 mg | ORAL_TABLET | Freq: Every day | ORAL | Status: DC

## 2014-03-22 ENCOUNTER — Ambulatory Visit (INDEPENDENT_AMBULATORY_CARE_PROVIDER_SITE_OTHER): Payer: BC Managed Care – PPO | Admitting: Family Medicine

## 2014-03-22 ENCOUNTER — Encounter: Payer: Self-pay | Admitting: Family Medicine

## 2014-03-22 VITALS — BP 136/78 | HR 98 | Temp 98.9°F | Ht 68.0 in | Wt 128.5 lb

## 2014-03-22 DIAGNOSIS — M79671 Pain in right foot: Secondary | ICD-10-CM | POA: Diagnosis not present

## 2014-03-22 DIAGNOSIS — R200 Anesthesia of skin: Secondary | ICD-10-CM

## 2014-03-22 DIAGNOSIS — R202 Paresthesia of skin: Secondary | ICD-10-CM | POA: Diagnosis not present

## 2014-03-22 MED ORDER — IBUPROFEN 600 MG PO TABS: 600.0000 mg | ORAL_TABLET | Freq: Three times a day (TID) | ORAL | Status: DC | PRN

## 2014-03-22 MED ORDER — GABAPENTIN 100 MG PO CAPS: 100.0000 mg | ORAL_CAPSULE | Freq: Three times a day (TID) | ORAL | Status: DC

## 2014-03-22 NOTE — Patient Instructions (Signed)
Thank you for coming to see me today. It was a pleasure. Today we talked about:   Arm pain: I will get an x-ray of your cervical neck. I will also send you for a test called an EMG, which will assess the nerve conduction of your arm. In the mean time, I will prescribe a medication called Gabapentin to help with your numbness  Toe pain: this is most likely related to your standing. I recommend switching your insoles for new ones. I will also prescribe some ibuprofen to help with your pain. If your pain continues to worsen, we may consider getting an x-ray to make sure there are no fractures, however, I do not suspect that at this time.   Please make an appointment to see me in 3 months for follow-up.  If you have any questions or concerns, please do not hesitate to call the office at [phone].  Sincerely,  Cordelia Poche, MD

## 2014-03-22 NOTE — Progress Notes (Signed)
    Subjective    [name] is a 53 y.o. female that presents for an office visit.   1. Numbness: Started about one month ago. Numbness is getting worse. Started in palm of her right hand and moved to her index and ring fingers. Radiates up to arm and neck on her right side. Has been affecting her ability to write and lift using her right hand. No new medications.  2. Right foot pain: second and third toes of right foot. Started a few months ago. She has noticed the pain coinciding with her working (She stands up a lot). Tylenol has not really helped with her pain. Keeping off of her feet helps with the pain.   History  Substance Use Topics  . Smoking status: Current Every Day Smoker -- 0.50 packs/day for 35 years    Types: Cigarettes  . Smokeless tobacco: Never Used  . Alcohol Use: 5.0 oz/week    10 drink(s) per week    No Known Allergies  No orders of the defined types were placed in this encounter.    ROS  Per HPI   Objective   BP 136/78 mmHg  Pulse 98  Temp(Src) 98.9 F (37.2 C) (Oral)  Ht 5\' 8"  (1.727 m)  Wt 128 lb 8 oz (58.287 kg)  BMI 19.54 kg/m2  General: Well appearing female Musculoskeletal: Tenderness along metatarsophalangeal joint of second and third digits of right foot. Neuro: 4/5 strength in bilateral U/LE, numbness in palm of right hand that extends up to shoulder.  Assessment and Plan   Please refer to problem based charting of assessment and plan

## 2014-03-25 ENCOUNTER — Ambulatory Visit (HOSPITAL_COMMUNITY)
Admission: RE | Admit: 2014-03-25 | Discharge: 2014-03-25 | Disposition: A | Discharge status: Home / Self Care | Payer: BC Managed Care – PPO | Source: Ambulatory Visit | Attending: Family Medicine | Admitting: Family Medicine

## 2014-03-25 DIAGNOSIS — M5410 Radiculopathy, site unspecified: Secondary | ICD-10-CM | POA: Diagnosis not present

## 2014-03-25 DIAGNOSIS — R200 Anesthesia of skin: Secondary | ICD-10-CM

## 2014-03-25 DIAGNOSIS — M47892 Other spondylosis, cervical region: Secondary | ICD-10-CM | POA: Diagnosis not present

## 2014-03-25 DIAGNOSIS — M542 Cervicalgia: Secondary | ICD-10-CM | POA: Diagnosis present

## 2014-04-07 DIAGNOSIS — R200 Anesthesia of skin: Secondary | ICD-10-CM | POA: Insufficient documentation

## 2014-04-07 NOTE — Assessment & Plan Note (Signed)
Appears related to pressure on foot.  Change insoles of shoes to provide more cushioning  Ibuprofen 600mg  q6hrs PRN short term for pain  Advised to take some breaks when standing for long periods of time if possible

## 2014-04-07 NOTE — Assessment & Plan Note (Signed)
Possible median nerve involvement. No apparent weakness from exam, although patient has some issues with holding items  Cervical x-ray  Neurology referral for EMG

## 2014-07-15 ENCOUNTER — Other Ambulatory Visit: Payer: Self-pay | Admitting: Family Medicine

## 2014-07-15 DIAGNOSIS — Z1231 Encounter for screening mammogram for malignant neoplasm of breast: Secondary | ICD-10-CM

## 2014-07-24 ENCOUNTER — Ambulatory Visit (HOSPITAL_COMMUNITY)
Admission: RE | Admit: 2014-07-24 | Discharge: 2014-07-24 | Disposition: A | Discharge status: Home / Self Care | Payer: BC Managed Care – PPO | Source: Ambulatory Visit | Attending: Family Medicine | Admitting: Family Medicine

## 2014-07-24 DIAGNOSIS — Z1231 Encounter for screening mammogram for malignant neoplasm of breast: Secondary | ICD-10-CM | POA: Diagnosis present

## 2014-09-10 ENCOUNTER — Other Ambulatory Visit: Payer: Self-pay | Admitting: Family Medicine

## 2014-09-11 ENCOUNTER — Telehealth: Payer: Self-pay | Admitting: *Deleted

## 2014-09-11 NOTE — Telephone Encounter (Signed)
LMOVM for pt to return call.  Please advise of the below.  Will also mail letter. Bowie Delia, Salome Spotted

## 2014-09-11 NOTE — Telephone Encounter (Signed)
-----   Message from Mariel Aloe, MD sent at 09/11/2014 8:27 AM EDT -----    Refill x1 month. Patient needs appointment for yearly physical. Please inform patient to make an appointment if she wants future refills

## 2014-10-10 ENCOUNTER — Encounter: Payer: Self-pay | Admitting: Family Medicine

## 2014-10-10 ENCOUNTER — Ambulatory Visit (INDEPENDENT_AMBULATORY_CARE_PROVIDER_SITE_OTHER): Payer: BC Managed Care – PPO | Admitting: Family Medicine

## 2014-10-10 VITALS — BP 153/78 | HR 96 | Temp 98.3°F | Wt 131.6 lb

## 2014-10-10 DIAGNOSIS — Z113 Encounter for screening for infections with a predominantly sexual mode of transmission: Secondary | ICD-10-CM

## 2014-10-10 DIAGNOSIS — I10 Essential (primary) hypertension: Secondary | ICD-10-CM | POA: Diagnosis not present

## 2014-10-10 DIAGNOSIS — E7800 Pure hypercholesterolemia, unspecified: Secondary | ICD-10-CM

## 2014-10-10 DIAGNOSIS — Z720 Tobacco use: Secondary | ICD-10-CM

## 2014-10-10 DIAGNOSIS — Z1211 Encounter for screening for malignant neoplasm of colon: Secondary | ICD-10-CM

## 2014-10-10 NOTE — Progress Notes (Signed)
Subjective    [name] is a 54 y.o. female that presents for a yearly physical.   Concerns:  1. Hearing: States that her family says she speaks loudly and that she sometimes does not hear people very well. No tinnitus. No trauma.    2. Hypertension: Adherent with hydrochlorothiazide. No headaches, chest pain or shortness of breath. She does not check her blood pressure at home.  3. 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 She states that she is ready to quit and would like to quit tomorrow.   Annual Gynecological Exam  OB History    Gravida Para Term Preterm AB TAB SAB Ectopic Multiple Living   3 2 1 1 1  1          Wt Readings from Last 3 Encounters:  03/22/14 128 lb 8 oz (58.287 kg)  06/11/13 118 lb 9.6 oz (53.797 kg)  01/30/13 120 lb 9.6 oz (54.704 kg)   Last period: 04/12/2004 Regular periods: no Heavy bleeding: no  Sexually active: yes Birth control or hormonal therapy: None. Postmenopausal  Hx of STD: Patient desires STD screening Dyspareunia: No Hot flashes: No Vaginal discharge: None Dysuria: No   Last mammogram: 08/06/2014 Breast mass or concerns: No Last Pap: 03/30/2011  History of abnormal pap: No   FH of breast, uterine, ovarian, colon cancer: No  Past Medical History  Diagnosis Date  . Hypertension 2005    Past Surgical History  Procedure Laterality Date  . Diagnostic laparoscopy      for persistant right ovarian cyst  . Breast biopsy    . Axillary lymph node biopsy      Current Outpatient Prescriptions on File Prior to Visit  Medication Sig Dispense Refill  . gabapentin (NEURONTIN) 100 MG capsule Take 1 capsule (100 mg total) by mouth 3 (three) times daily. 90 capsule 3  . hydrochlorothiazide (HYDRODIURIL) 25 MG tablet TAKE 1 TABLET BY MOUTH EVERY DAY 30 tablet 0  . ibuprofen (ADVIL,MOTRIN) 600 MG tablet Take 1 tablet (600 mg total) by mouth every 8 (eight) hours as needed for mild pain. 30 tablet 0   No current  facility-administered medications on file prior to visit.    No Known Allergies  History   Social History  . Marital Status: Single    Spouse Name: N/A  . Number of Children: N/A  . Years of Education: N/A   Social History Main Topics  . Smoking status: Current Every Day Smoker -- 0.50 packs/day for 35 years    Types: Cigarettes  . Smokeless tobacco: Never Used  . Alcohol Use: 5.0 oz/week    10 drink(s) per week  . Drug Use: No  . Sexual Activity:    Partners: Male   Other Topics Concern  . 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  Single, two sons >18.  Both sons live with her along with dog.  No other house occupants.    Family History  Problem Relation Age of Onset  . Heart disease Mother   . Diabetes Mellitus II Mother   . Hypertension Mother   . Prostate cancer Father   . Heart disease Father   . Diabetes Mellitus II Brother   . Hypertension Brother   . Diabetes Mellitus II Brother   . Hyperlipidemia Sister     ROS  Per HPI   Objective  BP 153/78 mmHg  Pulse 96  Temp(Src) 98.3 F (36.8 C)  Wt 131 lb 9.6 oz (59.693 kg)  LMP 04/12/2004  General: Well appearing, no distress HEENT: TMs normal, nose patent, patient has top row dentures with no visible carries on bottom row. No adenopathy Respiratory/Chest: Clear to auscultation bilaterally Cardiovascular: Regular rate and rhythm Gastrointestinal: Soft, non-tender, non-distended, no organomegaly Genitourinary: Not examined, patient preference    Musculoskeletal: No muscle wasting, no leg swelling Neuro: Alert, oriented, CN intact, 2+ reflexes bilaterally throuthout. Negative Weber and Rinne Dermatologic: No significant rashes noted Psychiatric: Full affect  No orders of the defined types were placed in this encounter.    Assessment and Plan    Please refer to problem based charting of assessment and plan

## 2014-10-10 NOTE — Patient Instructions (Signed)
Thank you for coming to see me today. It was a pleasure. Today we talked about:   Hypertension: I am refilling your hydrochlorothiazide. Your blood pressure was slightly elevated today. I will not increase your medication for now.  Healthcare maintenance: I have put in a referral for you to obtain a colonoscopy. Please return soon for a pap smear.  Please make an appointment to see me in 4 weeks for follow-up and pap smear.  If you have any questions or concerns, please do not hesitate to call the office at [phone].  Sincerely,  Cordelia Poche, MD

## 2014-10-14 DIAGNOSIS — Z720 Tobacco use: Secondary | ICD-10-CM | POA: Insufficient documentation

## 2014-10-14 NOTE — Assessment & Plan Note (Addendum)
Refill HCTZ. Recheck at next visit.

## 2014-10-14 NOTE — Assessment & Plan Note (Signed)
Patient states she can quit on her own and does not want resources or medication. Her quit date is 10/11/2014

## 2014-10-16 ENCOUNTER — Encounter: Payer: Self-pay | Admitting: Gastroenterology

## 2014-10-21 ENCOUNTER — Other Ambulatory Visit (INDEPENDENT_AMBULATORY_CARE_PROVIDER_SITE_OTHER): Payer: BC Managed Care – PPO

## 2014-10-21 DIAGNOSIS — E7800 Pure hypercholesterolemia, unspecified: Secondary | ICD-10-CM

## 2014-10-21 DIAGNOSIS — I10 Essential (primary) hypertension: Secondary | ICD-10-CM

## 2014-10-21 LAB — CBC WITH DIFFERENTIAL/PLATELET
Basophils Absolute: 0 10*3/uL (ref 0.0–0.1)
Basophils Relative: 0 % (ref 0–1)
EOS ABS: 0.1 10*3/uL (ref 0.0–0.7)
EOS PCT: 2 % (ref 0–5)
HCT: 36.3 % (ref 36.0–46.0)
HEMOGLOBIN: 12 g/dL (ref 12.0–15.0)
LYMPHS ABS: 2.8 10*3/uL (ref 0.7–4.0)
Lymphocytes Relative: 45 % (ref 12–46)
MCH: 32.7 pg (ref 26.0–34.0)
MCHC: 33.1 g/dL (ref 30.0–36.0)
MCV: 98.9 fL (ref 78.0–100.0)
MONO ABS: 0.3 10*3/uL (ref 0.1–1.0)
MONOS PCT: 5 % (ref 3–12)
MPV: 9 fL (ref 8.6–12.4)
NEUTROS ABS: 3 10*3/uL (ref 1.7–7.7)
Neutrophils Relative %: 48 % (ref 43–77)
Platelets: 377 10*3/uL (ref 150–400)
RBC: 3.67 MIL/uL — ABNORMAL LOW (ref 3.87–5.11)
RDW: 13.3 % (ref 11.5–15.5)
WBC: 6.2 10*3/uL (ref 4.0–10.5)

## 2014-10-21 LAB — COMPLETE METABOLIC PANEL WITH GFR
ALBUMIN: 4.3 g/dL (ref 3.5–5.2)
ALK PHOS: 76 U/L (ref 39–117)
ALT: 13 U/L (ref 0–35)
AST: 17 U/L (ref 0–37)
BILIRUBIN TOTAL: 0.4 mg/dL (ref 0.2–1.2)
BUN: 13 mg/dL (ref 6–23)
CALCIUM: 9.7 mg/dL (ref 8.4–10.5)
CO2: 29 meq/L (ref 19–32)
CREATININE: 0.71 mg/dL (ref 0.50–1.10)
Chloride: 102 mEq/L (ref 96–112)
GFR, Est African American: >89 mL/min
GFR, Est Non African American: >89 mL/min
GLUCOSE: 95 mg/dL (ref 70–99)
POTASSIUM: 4.3 meq/L (ref 3.5–5.3)
SODIUM: 140 meq/L (ref 135–145)
Total Protein: 6.9 g/dL (ref 6.0–8.3)

## 2014-10-21 LAB — POCT GLYCOSYLATED HEMOGLOBIN (HGB A1C): Hemoglobin A1C: 5.5

## 2014-10-21 LAB — LIPID PANEL
CHOL/HDL RATIO: 3.2 ratio
CHOLESTEROL: 190 mg/dL (ref 0–200)
HDL: 59 mg/dL (ref >=46)
LDL Cholesterol: 116 mg/dL — ABNORMAL HIGH (ref 0–99)
TRIGLYCERIDES: 77 mg/dL (ref <150)
VLDL: 15 mg/dL (ref 0–40)

## 2014-10-21 LAB — TSH: TSH: 1.209 u[IU]/mL (ref 0.350–4.500)

## 2014-10-21 NOTE — Progress Notes (Signed)
TSH,CBC WITH DIFF,CMP,FLP AND A1C DONE TODAY [name]

## 2014-10-27 ENCOUNTER — Encounter: Payer: Self-pay | Admitting: Family Medicine

## 2014-10-28 ENCOUNTER — Other Ambulatory Visit: Payer: Self-pay | Admitting: Family Medicine

## 2014-12-30 ENCOUNTER — Encounter: Payer: BC Managed Care – PPO | Admitting: Gastroenterology

## 2015-06-25 ENCOUNTER — Emergency Department (HOSPITAL_COMMUNITY): Payer: BC Managed Care – PPO

## 2015-06-25 ENCOUNTER — Encounter (HOSPITAL_COMMUNITY): Payer: Self-pay | Admitting: *Deleted

## 2015-06-25 ENCOUNTER — Emergency Department (HOSPITAL_COMMUNITY)
Admission: EM | Admit: 2015-06-25 | Discharge: 2015-06-25 | Disposition: A | Discharge status: Home / Self Care | Payer: BC Managed Care – PPO | Attending: Emergency Medicine | Admitting: Emergency Medicine

## 2015-06-25 DIAGNOSIS — J159 Unspecified bacterial pneumonia: Secondary | ICD-10-CM | POA: Diagnosis not present

## 2015-06-25 DIAGNOSIS — I10 Essential (primary) hypertension: Secondary | ICD-10-CM | POA: Insufficient documentation

## 2015-06-25 DIAGNOSIS — Z79899 Other long term (current) drug therapy: Secondary | ICD-10-CM | POA: Diagnosis not present

## 2015-06-25 DIAGNOSIS — R51 Headache: Secondary | ICD-10-CM | POA: Diagnosis present

## 2015-06-25 DIAGNOSIS — F17210 Nicotine dependence, cigarettes, uncomplicated: Secondary | ICD-10-CM | POA: Insufficient documentation

## 2015-06-25 DIAGNOSIS — E876 Hypokalemia: Secondary | ICD-10-CM | POA: Insufficient documentation

## 2015-06-25 DIAGNOSIS — J189 Pneumonia, unspecified organism: Secondary | ICD-10-CM

## 2015-06-25 LAB — COMPREHENSIVE METABOLIC PANEL
ALT: 25 U/L (ref 14–54)
ANION GAP: 14 (ref 5–15)
AST: 40 U/L (ref 15–41)
Albumin: 3.8 g/dL (ref 3.5–5.0)
Alkaline Phosphatase: 74 U/L (ref 38–126)
BUN: 9 mg/dL (ref 6–20)
CHLORIDE: 95 mmol/L — AB (ref 101–111)
CO2: 28 mmol/L (ref 22–32)
Calcium: 9 mg/dL (ref 8.9–10.3)
Creatinine, Ser: 1 mg/dL (ref 0.44–1.00)
GFR calc non Af Amer: >60 mL/min (ref >60)
Glucose, Bld: 103 mg/dL — ABNORMAL HIGH (ref 65–99)
POTASSIUM: 2.8 mmol/L — AB (ref 3.5–5.1)
SODIUM: 137 mmol/L (ref 135–145)
Total Bilirubin: 0.4 mg/dL (ref 0.3–1.2)
Total Protein: 7.2 g/dL (ref 6.5–8.1)

## 2015-06-25 LAB — CBC WITH DIFFERENTIAL/PLATELET
Basophils Absolute: 0 10*3/uL (ref 0.0–0.1)
Basophils Relative: 0 %
EOS ABS: 0 10*3/uL (ref 0.0–0.7)
EOS PCT: 0 %
HCT: 36.7 % (ref 36.0–46.0)
Hemoglobin: 12 g/dL (ref 12.0–15.0)
LYMPHS ABS: 1.1 10*3/uL (ref 0.7–4.0)
Lymphocytes Relative: 28 %
MCH: 32.3 pg (ref 26.0–34.0)
MCHC: 32.7 g/dL (ref 30.0–36.0)
MCV: 98.9 fL (ref 78.0–100.0)
Monocytes Absolute: 0.3 10*3/uL (ref 0.1–1.0)
Monocytes Relative: 7 %
Neutro Abs: 2.6 10*3/uL (ref 1.7–7.7)
Neutrophils Relative %: 65 %
PLATELETS: 226 10*3/uL (ref 150–400)
RBC: 3.71 MIL/uL — AB (ref 3.87–5.11)
RDW: 13.2 % (ref 11.5–15.5)
WBC: 4 10*3/uL (ref 4.0–10.5)

## 2015-06-25 LAB — URINALYSIS, ROUTINE W REFLEX MICROSCOPIC
Bilirubin Urine: NEGATIVE
Glucose, UA: NEGATIVE mg/dL
Hgb urine dipstick: NEGATIVE
Ketones, ur: NEGATIVE mg/dL
LEUKOCYTES UA: NEGATIVE
NITRITE: NEGATIVE
PROTEIN: NEGATIVE mg/dL
SPECIFIC GRAVITY, URINE: 1.02 (ref 1.005–1.030)
pH: 7 (ref 5.0–8.0)

## 2015-06-25 LAB — LIPASE, BLOOD: Lipase: 44 U/L (ref 11–51)

## 2015-06-25 MED ORDER — SODIUM CHLORIDE 0.9 % IV BOLUS (SEPSIS)
1000.0000 mL | Freq: Once | INTRAVENOUS | Status: AC
  Administered 2015-06-25: 1000 mL via INTRAVENOUS

## 2015-06-25 MED ORDER — AZITHROMYCIN 250 MG PO TABS
250.0000 mg | ORAL_TABLET | Freq: Every day | ORAL | Status: DC
Start: 2015-06-25 — End: 2016-07-14

## 2015-06-25 MED ORDER — ALBUTEROL SULFATE HFA 108 (90 BASE) MCG/ACT IN AERS
1.0000 | INHALATION_SPRAY | RESPIRATORY_TRACT | Status: DC | PRN
  Administered 2015-06-25: 1 via RESPIRATORY_TRACT
  Filled 2015-06-25: qty 6.7

## 2015-06-25 MED ORDER — ONDANSETRON HCL 4 MG/2ML IJ SOLN
4.0000 mg | Freq: Once | INTRAMUSCULAR | Status: AC
  Administered 2015-06-25: 4 mg via INTRAVENOUS
  Filled 2015-06-25: qty 2

## 2015-06-25 MED ORDER — POTASSIUM CHLORIDE CRYS ER 20 MEQ PO TBCR
40.0000 meq | EXTENDED_RELEASE_TABLET | Freq: Once | ORAL | Status: AC
  Administered 2015-06-25: 40 meq via ORAL
  Filled 2015-06-25: qty 2

## 2015-06-25 NOTE — ED Notes (Signed)
Pt ambulates independently and with steady gait at time of discharge. Discharge instructions and follow up information reviewed with patient. No other questions or concerns voiced at this time.  

## 2015-06-25 NOTE — ED Notes (Signed)
Last Saturday pt woke up feeling generalized fatigue, nausea, headache, and just not feeling good. Pt had  1 episode of diarrhea yesterday and decided it was time to come get some help.

## 2015-06-25 NOTE — ED Provider Notes (Signed)
CSN: [id number]     Arrival date & time 06/25/15  0900 History   First MD Initiated Contact with Patient 06/25/15 1001     Chief Complaint  Patient presents with  . Nausea  . Headache     (Consider location/radiation/quality/duration/timing/severity/associated sxs/prior Treatment) HPI Comments: Pt presents with a 10 day hx of persistent cough and 7 day hx of poor appetite and nausea.  Pt states 10 days ago she developed fever,chills, myalgias and cough without SOB.  Fever lasted 2 days but has resolved with persistent productive cough with white sputum.  No hemoptysis or wheezing per pt. Pt states for the last 7 days she has had poor appetite and persistent nausea.  No abd pain or vomiting.  However she is only able to drink some broth.  No diarrhea or urinary/vaginal complaints.  Patient is a 55 y.o. female presenting with headaches. The history is provided by the patient.  Headache   Past Medical History  Diagnosis Date  . Hypertension 2005   Past Surgical History  Procedure Laterality Date  . Diagnostic laparoscopy      for persistant right ovarian cyst  . Breast biopsy    . Axillary lymph node biopsy     Family History  Problem Relation Age of Onset  . Heart disease Mother   . Diabetes Mellitus II Mother   . Hypertension Mother   . Prostate cancer Father   . Heart disease Father   . Diabetes Mellitus II Brother   . Hypertension Brother   . Diabetes Mellitus II Brother   . Hyperlipidemia Sister    Social History  Substance Use Topics  . Smoking status: Current Every Day Smoker -- 0.50 packs/day for 35 years    Types: Cigarettes  . Smokeless tobacco: Never Used  . Alcohol Use: 5.0 oz/week    10 drink(s) per week   OB History    Gravida Para Term Preterm AB TAB SAB Ectopic Multiple Living   3 2 1 1 1  1         Review of Systems  Neurological: Positive for headaches.  All other systems reviewed and are negative.     Allergies  Review of patient's allergies  indicates no known allergies.  Home Medications   Prior to Admission medications   Medication Sig Start Date End Date Taking? Authorizing Provider  hydrochlorothiazide (HYDRODIURIL) 25 MG tablet TAKE 1 TABLET BY MOUTH EVERY DAY 10/29/14   Mariel Aloe, MD   BP 112/59 mmHg  Pulse 93  Temp(Src) 98.9 F (37.2 C) (Oral)  Resp 18  Ht 5\' 7"  (1.702 m)  Wt 120 lb (54.432 kg)  BMI 18.79 kg/m2  SpO2 95%  LMP 04/12/2004 Physical Exam  Constitutional: She is oriented to person, place, and time. She appears well-developed and well-nourished. No distress.  HENT:  Head: Normocephalic and atraumatic.  Mouth/Throat: Oropharynx is clear and moist. Mucous membranes are dry.  Eyes: Conjunctivae and EOM are normal. Pupils are equal, round, and reactive to light.  Neck: Normal range of motion. Neck supple.  Cardiovascular: Normal rate, regular rhythm and intact distal pulses.   No murmur heard. Pulmonary/Chest: Effort normal and breath sounds normal. No respiratory distress. She has no wheezes. She has no rales.  Abdominal: Soft. She exhibits no distension. There is no tenderness. There is no rebound and no guarding.  Musculoskeletal: Normal range of motion. She exhibits no edema or tenderness.  Neurological: She is alert and oriented to person, place, and time.  Skin: Skin is warm and dry. No rash noted. No erythema.  Psychiatric: She has a normal mood and affect. Her behavior is normal.  Nursing note and vitals reviewed.   ED Course  Procedures (including critical care time) Labs Review Labs Reviewed  CBC WITH DIFFERENTIAL/PLATELET - Abnormal; Notable for the following:    RBC 3.71 (*)    All other components within normal limits  COMPREHENSIVE METABOLIC PANEL - Abnormal; Notable for the following:    Potassium 2.8 (*)    Chloride 95 (*)    Glucose, Bld 103 (*)    All other components within normal limits  LIPASE, BLOOD  URINALYSIS, ROUTINE W REFLEX MICROSCOPIC (NOT AT [facility])     Imaging Review Dg Chest 2 View  06/25/2015  CLINICAL DATA:  55 year old female with a history of productive cough EXAM: CHEST - 2 VIEW COMPARISON:  None. FINDINGS: Cardiomediastinal silhouette within normal limits in size and contour. No evidence of pulmonary vascular congestion. No confluent airspace disease, however, there is a pattern of mixed micro nodular an linear/interstitial opacities bilateral lungs. No pneumothorax or large pleural effusion. No displaced fracture. Unremarkable appearance of the upper abdomen. IMPRESSION: Pattern of micro nodular and linear opacities of the bilateral lungs, compatible with nonspecific pneumonitis, either infectious or inflammatory. No evidence of lobar pneumonia. Signed, Dulcy Fanny. Earleen Newport, DO [facility] Radiology Electronically Signed   By: Corrie Mckusick D.O.   On: 06/25/2015 10:42   I have personally reviewed and evaluated these images and lab results as part of my medical decision-making.   EKG Interpretation   Date/Time:  Wednesday June 25 2015 11:15:42 EDT Ventricular Rate:  88 PR Interval:  143 QRS Duration: 85 QT Interval:  353 QTC Calculation: 427 R Axis:   92 Text Interpretation:  Sinus rhythm Borderline right axis deviation  Borderline T wave abnormalities No previous tracing Confirmed by Maryan Rued   MD, Ollie Esty ([id number]) on 06/25/2015 11:25:34 AM      MDM   Final diagnoses:  CAP (community acquired pneumonia)  Hypokalemia   Pt with URI sx over the last 10 days with decreased oral intake for the last 6.  Pt denies chest pain, SOB, vomiting or diarrhea.  Pt does have a tobacco hx but no other drugs.  Drinks 3 beers a day but no liquor.  Pt has no focal abd pain on exam and no signs of resp distress or wheezing.  Pt well appearing with normal VS. Low suspicion for cardiac etiology.  Possible pneumonia given recent and ongoing URI vs flu vs other viral process.  No abd pain concerning for  appy, chole, diverticulitis or pancreatitis.  Pt does appear slightly dehydrated and will give fluids and zofran.  EKG, CBC, CMP, lipase, CXR and UA pending.  10:57 AM Pt found to have most likely infectious pneumonitis and with length of sx will treat with azithromycin.  Labs wnl except for hypokalemia of 2.8 and pt given oral dose here and recommended increase K in diet.  Pt does take HCTZ which may be cause of hypokalemia and poor po's.  Will have pt f/u with PCP Friday or Monday.  Blanchie Dessert, MD 06/25/15 [phone]

## 2015-06-25 NOTE — Discharge Instructions (Signed)
Community-Acquired Pneumonia, Adult Pneumonia is an infection of the lungs. One type of pneumonia can happen while a person is in a hospital. A different type can happen when a person is not in a hospital (community-acquired pneumonia). It is easy for this kind to spread from person to person. It can spread to you if you breathe near an infected person who coughs or sneezes. Some symptoms include:  A dry cough.  A wet (productive) cough.  Fever.  Sweating.  Chest pain. HOME CARE  Take over-the-counter and prescription medicines only as told by your doctor.  Only take cough medicine if you are losing sleep.  If you were prescribed an antibiotic medicine, take it as told by your doctor. Do not stop taking the antibiotic even if you start to feel better.  Sleep with your head and neck raised (elevated). You can do this by putting a few pillows under your head, or you can sleep in a recliner.  Do not use tobacco products. These include cigarettes, chewing tobacco, and e-cigarettes. If you need help quitting, ask your doctor.  Drink enough water to keep your pee (urine) clear or pale yellow. A shot (vaccine) can help prevent pneumonia. Shots are often suggested for:  People older than 53 years of age.  People older than 54 years of age:  Who are having cancer treatment.  Who have long-term (chronic) lung disease.  Who have problems with their body's defense system (immune system). You may also prevent pneumonia if you take these actions:  Get the flu (influenza) shot every year.  Go to the dentist as often as told.  Wash your hands often. If soap and water are not available, use hand sanitizer. GET HELP IF:  You have a fever.  You lose sleep because your cough medicine does not help. GET HELP RIGHT AWAY IF:  You are short of breath and it gets worse.  You have more chest pain.  Your sickness gets worse. This is very serious if:  You are an older adult.  Your  body's defense system is weak.  You cough up blood.   This information is not intended to replace advice given to you by your health care provider. Make sure you discuss any questions you have with your health care provider.   Document Released: 09/15/2007 Document Revised: 12/18/2014 Document Reviewed: 07/24/2014 Elsevier Interactive Patient Education 2016 [place].  Potassium Content of Foods Potassium is a mineral found in many foods and drinks. It helps keep fluids and minerals balanced in your body and affects how steadily your heart beats. Potassium also helps control your blood pressure and keep your muscles and nervous system healthy. Certain health conditions and medicines may change the balance of potassium in your body. When this happens, you can help balance your level of potassium through the foods that you do or do not eat. Your health care provider or dietitian may recommend an amount of potassium that you should have each day. The following lists of foods provide the amount of potassium (in parentheses) per serving in each item. HIGH IN POTASSIUM  The following foods and beverages have 200 mg or more of potassium per serving:  Apricots, 2 raw or 5 dry (200 mg).  Artichoke, 1 medium (345 mg).  Avocado, raw,  each (245 mg).  Banana, 1 medium (425 mg).  Beans, lima, or baked beans, canned,  cup (280 mg).  Beans, white, canned,  cup (595 mg).  Beef roast, 3 oz (320 mg).  Beef, ground, 3 oz (270 mg).  Beets, raw or cooked,  cup (260 mg).  Bran muffin, 2 oz (300 mg).  Broccoli,  cup (230 mg).  Brussels sprouts,  cup (250 mg).  Cantaloupe,  cup (215 mg).  Cereal, 100% bran,  cup (200-400 mg).  Cheeseburger, single, fast food, 1 each (225-400 mg).  Chicken, 3 oz (220 mg).  Clams, canned, 3 oz (535 mg).  Crab, 3 oz (225 mg).  Dates, 5 each (270 mg).  Dried beans and peas,  cup (300-475 mg).  Figs, dried, 2 each (260 mg).  Fish: halibut,  tuna, cod, snapper, 3 oz (480 mg).  Fish: salmon, haddock, swordfish, perch, 3 oz (300 mg).  Fish, tuna, canned 3 oz (200 mg).  [place] fries, fast food, 3 oz (470 mg).  Granola with fruit and nuts,  cup (200 mg).  Grapefruit juice,  cup (200 mg).  Greens, beet,  cup (655 mg).  Honeydew melon,  cup (200 mg).  Kale, raw, 1 cup (300 mg).  Kiwi, 1 medium (240 mg).  Kohlrabi, rutabaga, parsnips,  cup (280 mg).  Lentils,  cup (365 mg).  Mango, 1 each (325 mg).  Milk, chocolate, 1 cup (420 mg).  Milk: nonfat, low-fat, whole, buttermilk, 1 cup (350-380 mg).  Molasses, 1 Tbsp (295 mg).  Mushrooms,  cup (280) mg.  Nectarine, 1 each (275 mg).  Nuts: almonds, peanuts, hazelnuts, [place], cashew, mixed, 1 oz (200 mg).  Nuts, pistachios, 1 oz (295 mg).  Orange, 1 each (240 mg).  Orange juice,  cup (235 mg).  Papaya, medium,  fruit (390 mg).  Peanut butter, chunky, 2 Tbsp (240 mg).  Peanut butter, smooth, 2 Tbsp (210 mg).  Pear, 1 medium (200 mg).  Pomegranate, 1 whole (400 mg).  Pomegranate juice,  cup (215 mg).  Pork, 3 oz (350 mg).  Potato chips, salted, 1 oz (465 mg).  Potato, baked with skin, 1 medium (925 mg).  Potatoes, boiled,  cup (255 mg).  Potatoes, mashed,  cup (330 mg).  Prune juice,  cup (370 mg).  Prunes, 5 each (305 mg).  Pudding, chocolate,  cup (230 mg).  Pumpkin, canned,  cup (250 mg).  Raisins, seedless,  cup (270 mg).  Seeds, sunflower or pumpkin, 1 oz (240 mg).  Soy milk, 1 cup (300 mg).  Spinach,  cup (420 mg).  Spinach, canned,  cup (370 mg).  Sweet potato, baked with skin, 1 medium (450 mg).  Swiss chard,  cup (480 mg).  Tomato or vegetable juice,  cup (275 mg).  Tomato sauce or puree,  cup (400-550 mg).  Tomato, raw, 1 medium (290 mg).  Tomatoes, canned,  cup (200-300 mg).  [place], 3 oz (250 mg).  Wheat germ, 1 oz (250 mg).  Winter squash,  cup (250 mg).  Yogurt, plain or fruited, 6  oz (260-435 mg).  Zucchini,  cup (220 mg). MODERATE IN POTASSIUM The following foods and beverages have 50-200 mg of potassium per serving:  Apple, 1 each (150 mg).  Apple juice,  cup (150 mg).  Applesauce,  cup (90 mg).  Apricot nectar,  cup (140 mg).  Asparagus, small spears,  cup or 6 spears (155 mg).  Bagel, cinnamon raisin, 1 each (130 mg).  Bagel, egg or plain, 4 in., 1 each (70 mg).  Beans, green,  cup (90 mg).  Beans, yellow,  cup (190 mg).  Beer, regular, 12 oz (100 mg).  Beets, canned,  cup (125 mg).  Blackberries,  cup (115 mg).  Blueberries,  cup (60 mg).  Bread, whole wheat, 1 slice (70 mg).  Broccoli, raw,  cup (145 mg).  Cabbage,  cup (150 mg).  Carrots, cooked or raw,  cup (180 mg).  Cauliflower, raw,  cup (150 mg).  Celery, raw,  cup (155 mg).  Cereal, bran flakes, cup (120-150 mg).  Cheese, cottage,  cup (110 mg).  Cherries, 10 each (150 mg).  Chocolate, 1 oz bar (165 mg).  Coffee, brewed 6 oz (90 mg).  Corn,  cup or 1 ear (195 mg).  Cucumbers,  cup (80 mg).  Egg, large, 1 each (60 mg).  Eggplant,  cup (60 mg).  Endive, raw, cup (80 mg).  English muffin, 1 each (65 mg).  Fish, orange roughy, 3 oz (150 mg).  Frankfurter, beef or pork, 1 each (75 mg).  Fruit cocktail,  cup (115 mg).  Grape juice,  cup (170 mg).  Grapefruit,  fruit (175 mg).  Grapes,  cup (155 mg).  Greens: kale, turnip, collard,  cup (110-150 mg).  Ice cream or frozen yogurt, chocolate,  cup (175 mg).  Ice cream or frozen yogurt, vanilla,  cup (120-150 mg).  Lemons, limes, 1 each (80 mg).  Lettuce, all types, 1 cup (100 mg).  Mixed vegetables,  cup (150 mg).  Mushrooms, raw,  cup (110 mg).  Nuts: walnuts, pecans, or macadamia, 1 oz (125 mg).  Oatmeal,  cup (80 mg).  Okra,  cup (110 mg).  Onions, raw,  cup (120 mg).  Peach, 1 each (185 mg).  Peaches, canned,  cup (120 mg).  Pears, canned,  cup  (120 mg).  Peas, green, frozen,  cup (90 mg).  Peppers, green,  cup (130 mg).  Peppers, red,  cup (160 mg).  Pineapple juice,  cup (165 mg).  Pineapple, fresh or canned,  cup (100 mg).  Plums, 1 each (105 mg).  Pudding, vanilla,  cup (150 mg).  Raspberries,  cup (90 mg).  Rhubarb,  cup (115 mg).  Rice, wild,  cup (80 mg).  Shrimp, 3 oz (155 mg).  Spinach, raw, 1 cup (170 mg).  Strawberries,  cup (125 mg).  Summer squash  cup (175-200 mg).  Swiss chard, raw, 1 cup (135 mg).  Tangerines, 1 each (140 mg).  Tea, brewed, 6 oz (65 mg).  Turnips,  cup (140 mg).  Watermelon,  cup (85 mg).  Wine, red, table, 5 oz (180 mg).  Wine, white, table, 5 oz (100 mg). LOW IN POTASSIUM The following foods and beverages have less than 50 mg of potassium per serving.  Bread, white, 1 slice (30 mg).  Carbonated beverages, 12 oz (less than 5 mg).  Cheese, 1 oz (20-30 mg).  Cranberries,  cup (45 mg).  Cranberry juice cocktail,  cup (20 mg).  Fats and oils, 1 Tbsp (less than 5 mg).  Hummus, 1 Tbsp (32 mg).  Nectar: papaya, mango, or pear,  cup (35 mg).  Rice, white or brown,  cup (50 mg).  Spaghetti or macaroni,  cup cooked (30 mg).  Tortilla, flour or corn, 1 each (50 mg).  Waffle, 4 in., 1 each (50 mg).  Water chestnuts,  cup (40 mg).   This information is not intended to replace advice given to you by your health care provider. Make sure you discuss any questions you have with your health care provider.   Document Released: 11/10/2004 Document Revised: 04/03/2013 Document Reviewed: 02/23/2013 Elsevier Interactive Patient Education [employer].

## 2015-11-24 ENCOUNTER — Other Ambulatory Visit: Payer: Self-pay | Admitting: *Deleted

## 2015-11-25 MED ORDER — HYDROCHLOROTHIAZIDE 25 MG PO TABS: 25.0000 mg | ORAL_TABLET | Freq: Every day | ORAL | 11 refills | Status: DC

## 2016-07-14 ENCOUNTER — Other Ambulatory Visit (HOSPITAL_COMMUNITY)
Admission: RE | Admit: 2016-07-14 | Discharge: 2016-07-14 | Disposition: A | Discharge status: Home / Self Care | Payer: BC Managed Care – PPO | Source: Ambulatory Visit | Attending: Family Medicine | Admitting: Family Medicine

## 2016-07-14 ENCOUNTER — Encounter: Payer: BC Managed Care – PPO | Admitting: Family Medicine

## 2016-07-14 ENCOUNTER — Ambulatory Visit (INDEPENDENT_AMBULATORY_CARE_PROVIDER_SITE_OTHER): Payer: BC Managed Care – PPO | Admitting: Internal Medicine

## 2016-07-14 ENCOUNTER — Encounter: Payer: Self-pay | Admitting: Internal Medicine

## 2016-07-14 VITALS — BP 144/82 | HR 88 | Temp 98.5°F | Ht 67.0 in | Wt 125.2 lb

## 2016-07-14 DIAGNOSIS — Z0000 Encounter for general adult medical examination without abnormal findings: Secondary | ICD-10-CM | POA: Diagnosis not present

## 2016-07-14 DIAGNOSIS — Z01419 Encounter for gynecological examination (general) (routine) without abnormal findings: Secondary | ICD-10-CM | POA: Insufficient documentation

## 2016-07-14 DIAGNOSIS — Z20828 Contact with and (suspected) exposure to other viral communicable diseases: Secondary | ICD-10-CM | POA: Diagnosis not present

## 2016-07-14 DIAGNOSIS — Z1151 Encounter for screening for human papillomavirus (HPV): Secondary | ICD-10-CM | POA: Diagnosis not present

## 2016-07-14 DIAGNOSIS — Z124 Encounter for screening for malignant neoplasm of cervix: Secondary | ICD-10-CM | POA: Diagnosis not present

## 2016-07-14 NOTE — Addendum Note (Signed)
Addended by: Melina Schools on: 07/14/2016 02:23 PM   Modules accepted: Orders

## 2016-07-14 NOTE — Progress Notes (Signed)
Subjective:    [name] - 56 y.o. female MRN [id number]  Date of birth: [birth year]  HPI  [name] is here for annual exam.  Concerns today: None  Periods: Postmenopausal  Contraception: None Pelvic symptoms: None  Sexual activity: No STD Screening: Declines  Pap smear status: Last done in 2012 with normal result. Reports she had an abnormal PAP about 30 years ago and had cryotherapy done to cervix.  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 No family h/o breast, ovarian, cervical, or colon cancer.   ROS Patient reports no  vision/ hearing changes,anorexia, weight change, fever ,adenopathy, persistant / recurrent hoarseness, swallowing issues, chest pain, edema,persistant / recurrent cough, hemoptysis, dyspnea(rest, exertional, paroxysmal nocturnal), gastrointestinal  bleeding (melena, rectal bleeding), abdominal pain, excessive heart burn, GU symptoms(dysuria, hematuria, pyuria, voiding/incontinence  Issues) syncope, focal weakness, severe memory loss, concerning skin lesions, depression, anxiety, abnormal bruising/bleeding, major joint swelling, breast masses or abnormal vaginal bleeding.  Patient admits to feet feet pain and right hand pain.    Health Maintenance Due  Topic Date Due  . Hepatitis C Screening  Nov 05, 1960  . HIV Screening  06/09/1976  . TETANUS/TDAP  06/09/1980  . COLONOSCOPY  06/10/2011  . PAP SMEAR  03/29/2014    -  reports that she has been smoking Cigarettes.  She has a 17.50 pack-year smoking history. She has never used smokeless tobacco. - Review of Systems: Per HPI. - Past Medical History: Patient Active Problem List   Diagnosis Date Noted  . Tobacco abuse 10/14/2014  . Right arm numbness  04/07/2014  . Pain in joint, shoulder region 06/11/2013  . Pain in joint, lower leg 06/11/2013  . Hypertension 03/08/2012  . Neck pain on right side 03/08/2012  . Foot pain 03/08/2012   - Medications: reviewed and updated   Objective:   Physical Exam BP (!) 144/82   Pulse 88   Temp 98.5 F (36.9 C) (Oral)   Ht 5\' 7"  (1.702 m)   Wt 125 lb 3.2 oz (56.8 kg)   LMP 04/12/2004   SpO2 99%   BMI 19.61 kg/m  Gen: NAD, alert, cooperative with exam, well-appearing HEENT: NCAT, PERRL, clear conjunctiva, oropharynx clear, supple neck CV: RRR, good S1/S2, no murmur, no edema, capillary refill brisk  Resp: CTABL, no wheezes, non-labored Abd: SNTND, BS present, no guarding or organomegaly Skin: no rashes, normal turgor  Neuro: no gross deficits.  Psych: good insight, alert and oriented GU/GYN: Exam performed in the presence of a chaperone. External genitalia within normal limits.  Vaginal mucosa pink, moist, normal rugae.  Nonfriable cervix without lesions, no discharge or bleeding noted on speculum exam.  Bimanual exam revealed normal, nongravid uterus.  No cervical motion tenderness. No adnexal masses bilaterally.      Assessment & Plan:   Health Maintenance:  -PAP performed today with HPV co-testing -mammogram sheet given -discussed importance of colonoscopy, handout given today  -HIV and Hep C testing done -patient agreeable to Tdap booster but out of vaccine for private insurance, will have patient receive at f/u with PCP or make nurse visit   Follow up with PCP for hand/foot pain and for management of other chronic medical conditions.  Phill Myron, D.O. 07/14/2016, 1:59 PM PGY-2, [place]

## 2016-07-14 NOTE — Patient Instructions (Addendum)
I will call you with your lab results. Please follow up with your PCP Dr. Rosalyn Gess as needed.   Nice to meet you today!   Dr. Juleen China

## 2016-07-15 LAB — HIV ANTIBODY (ROUTINE TESTING W REFLEX): HIV SCREEN 4TH GENERATION: NONREACTIVE

## 2016-07-15 LAB — HEPATITIS C ANTIBODY

## 2016-07-16 LAB — CYTOLOGY - PAP
Diagnosis: NEGATIVE
HPV: NOT DETECTED

## 2016-07-19 ENCOUNTER — Telehealth: Payer: Self-pay | Admitting: *Deleted

## 2016-07-19 NOTE — Telephone Encounter (Signed)
Pt informed of below. Zimmerman Rumple, Ellinor Test D, CMA  

## 2016-07-19 NOTE — Telephone Encounter (Signed)
-----   Message from Nicolette Bang, DO sent at 07/16/2016  4:06 PM EDT ----- Please call patient and let her know PAP was normal. HIV and Hep C were negative.   Phill Myron, D.O. 07/16/2016, 4:06 PM PGY-2, [facility]

## 2016-07-23 ENCOUNTER — Ambulatory Visit: Payer: BC Managed Care – PPO | Admitting: Family Medicine

## 2016-09-02 ENCOUNTER — Encounter: Payer: Self-pay | Admitting: Family Medicine

## 2016-09-02 ENCOUNTER — Ambulatory Visit (INDEPENDENT_AMBULATORY_CARE_PROVIDER_SITE_OTHER): Payer: BC Managed Care – PPO | Admitting: Family Medicine

## 2016-09-02 VITALS — BP 142/86 | HR 96 | Temp 98.6°F | Ht 67.0 in | Wt 124.0 lb

## 2016-09-02 DIAGNOSIS — M79644 Pain in right finger(s): Secondary | ICD-10-CM | POA: Insufficient documentation

## 2016-09-02 NOTE — Patient Instructions (Addendum)
[name], you were seen today for pain in your right thumb. I believe this is arthritis. I would recommend the following: 1. Naproxen (aleve): as needed for pain. Try taking tylenol first, but if you need something, especially before work this would be best. 2. Thumb splint: This might help. Please do not wear it all day.  I think that taking naproxen as needed for pain will be helpful (especially before work).  I have also provided you with a splint. Please do not wear this all day. Also I would recommend exercising your thumb.  3. Heat and ice and you could also try some aspercream.   If none of this works we could consider a steroid injection.  Please call the office and let me know if you're still having pain after four weeks.   Emmanuelle Coxe L. Rosalyn Gess, [place] Resident PGY-1 09/02/2016 2:24 PM

## 2016-09-02 NOTE — Assessment & Plan Note (Signed)
TTP at scaphoid with positive finkelstein's test. Although no crepitus and no discomfort beyond the radial styloid, making this less likely to be deQuevain and more likely to be arthritis. Provided patient with thumb spica splint.  - wear splint for comfort, but continue with exercises - naproxen as needed, but try tylenol first - Ice/heat/aspercream - follow up in 4 weeks if persistently painful and can consider steroid injection

## 2016-09-02 NOTE — Progress Notes (Signed)
    Subjective:  [name] is a 56 y.o. female who presents to the [facility] today for evaluation of her right hand pain  HPI:  Right hand pain: Patient presenting with pain at the base of her right thumb x2 months. She denies any numbness, tingling or trauma to the area.  Previously she was seen for numbness in her right hand concerning for medium nerve pathology and had a negative cervical xray.  This pain is focal and she is able to point to the area.   PMH: hypertension, foot pain, right arm numbness Tobacco use: yes Medication: reviewed and updated ROS: see HPI   Objective:  Physical Exam: BP (!) 142/86   Pulse 96   Temp 98.6 F (37 C) (Oral)   Ht 5\' 7"  (1.702 m)   Wt 56.2 kg (124 lb)   LMP 04/12/2004   SpO2 96%   BMI 19.42 kg/m   Gen: 56yo F in NAD, resting comfortably MSK: no edema, cyanosis, or clubbing noted Skin: warm and dry, no rashes MSK: no gross deformities or edema. FROM. Focal tenderness to palpation at scaphoid and tenderness at extensor pollicis brevis and abductor pollicis longus tendons with positive finkelstein's. No decreased range of motion, erythema, or edema. Neuro: grossly normal, moves all extremities Psych: Normal affect and thought content  No results found for this or any previous visit (from the past 72 hour(s)).   Assessment/Plan:  Thumb pain, right TTP at scaphoid with positive finkelstein's test. Although no crepitus and no discomfort beyond the radial styloid, making this less likely to be deQuevain and more likely to be arthritis. Provided patient with thumb spica splint.  - wear splint for comfort, but continue with exercises - naproxen as needed, but try tylenol first - Ice/heat/aspercream - follow up in 4 weeks if persistently painful and can consider steroid injection

## 2016-11-14 ENCOUNTER — Emergency Department (HOSPITAL_COMMUNITY)
Admission: EM | Admit: 2016-11-14 | Discharge: 2016-11-14 | Disposition: A | Discharge status: Home / Self Care | Payer: BC Managed Care – PPO | Attending: Emergency Medicine | Admitting: Emergency Medicine

## 2016-11-14 ENCOUNTER — Encounter (HOSPITAL_COMMUNITY): Payer: Self-pay | Admitting: *Deleted

## 2016-11-14 DIAGNOSIS — I10 Essential (primary) hypertension: Secondary | ICD-10-CM | POA: Diagnosis not present

## 2016-11-14 DIAGNOSIS — M791 Myalgia: Secondary | ICD-10-CM | POA: Insufficient documentation

## 2016-11-14 DIAGNOSIS — F17210 Nicotine dependence, cigarettes, uncomplicated: Secondary | ICD-10-CM | POA: Insufficient documentation

## 2016-11-14 DIAGNOSIS — M542 Cervicalgia: Secondary | ICD-10-CM | POA: Diagnosis present

## 2016-11-14 DIAGNOSIS — M7918 Myalgia, other site: Secondary | ICD-10-CM

## 2016-11-14 MED ORDER — MELOXICAM 7.5 MG PO TABS: 7.5000 mg | ORAL_TABLET | Freq: Every day | ORAL | 0 refills | Status: DC

## 2016-11-14 MED ORDER — METHOCARBAMOL 500 MG PO TABS: 500.0000 mg | ORAL_TABLET | Freq: Two times a day (BID) | ORAL | 0 refills | Status: DC

## 2016-11-14 NOTE — ED Triage Notes (Signed)
Pt reports having left side neck and shoulder pain x 5 days. Denies injury or heavy lifting. Pain increases with movement and when turning her head. Ambulatory at triage.

## 2016-11-14 NOTE — ED Provider Notes (Signed)
[facility] DEPT Provider Note   CSN: [id number] Arrival date & time: 11/14/16  0945  By signing my name below, I, Ephriam Jenkins, attest that this documentation has been prepared under the direction and in the presence of Kashis Penley PA-C.  Electronically Signed: Ephriam Jenkins, ED Scribe. 11/14/16. 11:07 AM.   History   Chief Complaint Chief Complaint  Patient presents with  . Neck Pain    HPI HPI Comments: [name] is a 56 y.o. female who presents to the Emergency Department complaining of persistent left sided neck pain that started four days ago. Pt noticed the pain when she woke up in the morning four days ago. Pt reports pain from the left side of her neck that intermittently radiates through her left shoulder and to the lateral left elbow. No radiation of pain into her chest, no shortness of breath. Pt frequently performs repetitive lifting movements at work. She denies any known injury or falls. Pt took ibuprofen this morning at 0330 without significant relief. Pt reports Hx of similar symptoms on the right side of her neck approximately 2 years ago. During previous instance, pt was given Tramadol and an unspecified muscle relaxer which relieved her symptoms. She denies any chest pain, shortness of breath, fever, rash, bowel or bladder incontinence. She denies HA, fevers, chills, or pain in the back of her neck. No numbness or tingling. She denies recent falls, trauma or injury. Pt has a PCP to follow up with.   The history is provided by the patient. No language interpreter was used.    Past Medical History:  Diagnosis Date  . Hypertension 2005    Patient Active Problem List   Diagnosis Date Noted  . Thumb pain, right 09/02/2016  . Tobacco abuse 10/14/2014  . Right arm numbness 04/07/2014  . Pain in joint, shoulder region 06/11/2013  . Pain in joint, lower leg 06/11/2013  . Hypertension 03/08/2012  . Neck pain on right side 03/08/2012  . Foot pain 03/08/2012     Past Surgical History:  Procedure Laterality Date  . AXILLARY LYMPH NODE BIOPSY    . BREAST BIOPSY    . DIAGNOSTIC LAPAROSCOPY     for persistant right ovarian cyst    OB History    Gravida Para Term Preterm AB Living   3 2 1 1 1      SAB TAB Ectopic Multiple Live Births   1               Home Medications    Prior to Admission medications   Medication Sig Start Date End Date Taking? Authorizing Provider  hydrochlorothiazide (HYDRODIURIL) 25 MG tablet Take 1 tablet (25 mg total) by mouth daily. 11/25/15   Eloise Levels, MD  meloxicam (MOBIC) 7.5 MG tablet Take 1 tablet (7.5 mg total) by mouth daily. 11/14/16   Shevelle Smither, PA-C  methocarbamol (ROBAXIN) 500 MG tablet Take 1 tablet (500 mg total) by mouth 2 (two) times daily. 11/14/16   Clova Morlock, PA-C    Family History Family History  Problem Relation Age of Onset  . Heart disease Mother   . Diabetes Mellitus II Mother   . Hypertension Mother   . Prostate cancer Father   . Heart disease Father   . Diabetes Mellitus II Brother   . Hypertension Brother   . Diabetes Mellitus II Brother   . Hyperlipidemia Sister     Social History Social History  Substance Use Topics  . Smoking status: Current Every Day  Smoker    Packs/day: 0.50    Years: 35.00    Types: Cigarettes  . Smokeless tobacco: Never Used  . Alcohol use 5.0 oz/week    10 Standard drinks or equivalent per week     Allergies   Patient has no known allergies.   Review of Systems Review of Systems  Constitutional: Negative for fever.  Respiratory: Negative for shortness of breath.   Cardiovascular: Negative for chest pain.  Genitourinary: Negative for urgency.  Musculoskeletal: Positive for neck pain. Negative for back pain, gait problem and joint swelling.  Skin: Negative for rash.  Neurological: Negative for weakness and numbness.     Physical Exam Updated Vital Signs BP 139/89 (BP Location: Left Arm)   Pulse 88   Temp 98.3 F  (36.8 C) (Oral)   Resp 18   LMP 04/12/2004   SpO2 99%   Physical Exam  Constitutional: She is oriented to person, place, and time. She appears well-developed and well-nourished. No distress.  HENT:  Head: Normocephalic and atraumatic.  Mouth/Throat: Uvula is midline, oropharynx is clear and moist and mucous membranes are normal.  Eyes: Pupils are equal, round, and reactive to light. EOM are normal.  Neck:  Decreased active ROM of neck due to pain on the L side of the neck. No TTP of the cervical spine. No neck stiffness. Pt moving her head easily throughout history.  Cardiovascular: Normal rate, regular rhythm and intact distal pulses.   Pulmonary/Chest: Effort normal and breath sounds normal. No respiratory distress. She has no wheezes.  Abdominal: Soft. She exhibits no distension. There is no tenderness.  Musculoskeletal: Normal range of motion.  TTP of the L trapezius muscle. No TTP of midline back. BUE strength intact, color and warmth equal, radial pulses intact, and sensation intact. Decreased active ROM of L arm due to pain. Full active ROM of elbow and wrist without pain. No obvious swelling, contusions or lacerations. Compartments soft.   Lymphadenopathy:    She has no cervical adenopathy.  Neurological: She is alert and oriented to person, place, and time.  Skin: Skin is warm and dry. She is not diaphoretic.  Psychiatric: She has a normal mood and affect.  Nursing note and vitals reviewed.    ED Treatments / Results  DIAGNOSTIC STUDIES: Oxygen Saturation is 99% on RA, normal by my interpretation.  COORDINATION OF CARE: 10:55 AM-Discussed treatment plan with pt at bedside and pt agreed to plan.   Labs (all labs ordered are listed, but only abnormal results are displayed) Labs Reviewed - No data to display  EKG  EKG Interpretation None       Radiology No results found.  Procedures Procedures (including critical care time)  Medications Ordered in  ED Medications - No data to display   Initial Impression / Assessment and Plan / ED Course  I have reviewed the triage vital signs and the nursing notes.  Pertinent labs & imaging results that were available during my care of the patient were reviewed by me and considered in my medical decision making (see chart for details).     Pt presenting with several days of L sided neck pain. Physical exam shows pain is in the musculature no pain midline. Moving head throughout history. No HA, fevers, or rashes. Neurovascularly intact without numbness or tingling. Doubt meningitis, cervical spine injury, RMSF or neurologic cause of pain. Likely MSK. Discussed conservative treatment. Pt to f/y with PCP if pain is not improved. Return precautions given. Pt states she  understands and agrees to plan.   Final Clinical Impressions(s) / ED Diagnoses   Final diagnoses:  Musculoskeletal pain    New Prescriptions Discharge Medication List as of 11/14/2016 11:00 AM    START taking these medications   Details  meloxicam (MOBIC) 7.5 MG tablet Take 1 tablet (7.5 mg total) by mouth daily., Starting Sun 11/14/2016, Print    methocarbamol (ROBAXIN) 500 MG tablet Take 1 tablet (500 mg total) by mouth 2 (two) times daily., Starting Sun 11/14/2016, Print      I personally performed the services described in this documentation, which was scribed in my presence. The recorded information has been reviewed and is accurate.    Franchot Heidelberg, PA-C 11/14/16 1333    Malvin Johns, MD 11/14/16 [phone]

## 2016-11-14 NOTE — Discharge Instructions (Signed)
Used Mobic once a day. Do not take other anti-inflammatories at the same time (Advil, naproxen, Aleve, Motrin, ibuprofen). You may supplement with Tylenol if needed. You may use the muscle relaxant up to twice a day as needed for muscle pain, stiffness, or soreness. Follow-up with your primary care doctor in 1 week if pain persists. Return to the emergency department if you develop fever, chills, rash, numbness or tingling, or any new or worsening symptoms.

## 2016-12-20 ENCOUNTER — Telehealth: Payer: Self-pay | Admitting: *Deleted

## 2016-12-20 MED ORDER — HYDROCHLOROTHIAZIDE 25 MG PO TABS: 25.0000 mg | ORAL_TABLET | Freq: Every day | ORAL | 11 refills | Status: DC

## 2016-12-20 NOTE — Telephone Encounter (Signed)
Patient has been out of medication for 4 days now.  Derl Barrow, RN

## 2016-12-20 NOTE — Telephone Encounter (Signed)
Patient left message on voicemail requesting refill on HCTZ.

## 2016-12-21 ENCOUNTER — Emergency Department (HOSPITAL_COMMUNITY): Payer: BC Managed Care – PPO

## 2016-12-21 ENCOUNTER — Other Ambulatory Visit: Payer: Self-pay

## 2016-12-21 ENCOUNTER — Observation Stay (HOSPITAL_COMMUNITY)
Admission: EM | Admit: 2016-12-21 | Discharge: 2016-12-23 | Disposition: A | Discharge status: Home / Self Care | Payer: BC Managed Care – PPO | Attending: Family Medicine | Admitting: Family Medicine

## 2016-12-21 ENCOUNTER — Encounter (HOSPITAL_COMMUNITY): Payer: Self-pay

## 2016-12-21 DIAGNOSIS — R000 Tachycardia, unspecified: Secondary | ICD-10-CM | POA: Diagnosis not present

## 2016-12-21 DIAGNOSIS — F17210 Nicotine dependence, cigarettes, uncomplicated: Secondary | ICD-10-CM | POA: Insufficient documentation

## 2016-12-21 DIAGNOSIS — R1110 Vomiting, unspecified: Secondary | ICD-10-CM | POA: Diagnosis not present

## 2016-12-21 DIAGNOSIS — Z789 Other specified health status: Secondary | ICD-10-CM | POA: Diagnosis not present

## 2016-12-21 DIAGNOSIS — I700 Atherosclerosis of aorta: Secondary | ICD-10-CM | POA: Insufficient documentation

## 2016-12-21 DIAGNOSIS — I119 Hypertensive heart disease without heart failure: Secondary | ICD-10-CM | POA: Insufficient documentation

## 2016-12-21 DIAGNOSIS — R0602 Shortness of breath: Secondary | ICD-10-CM | POA: Diagnosis present

## 2016-12-21 DIAGNOSIS — Z8249 Family history of ischemic heart disease and other diseases of the circulatory system: Secondary | ICD-10-CM | POA: Diagnosis not present

## 2016-12-21 DIAGNOSIS — J441 Chronic obstructive pulmonary disease with (acute) exacerbation: Principal | ICD-10-CM | POA: Insufficient documentation

## 2016-12-21 DIAGNOSIS — Z23 Encounter for immunization: Secondary | ICD-10-CM | POA: Insufficient documentation

## 2016-12-21 DIAGNOSIS — F1010 Alcohol abuse, uncomplicated: Secondary | ICD-10-CM | POA: Diagnosis not present

## 2016-12-21 DIAGNOSIS — M19019 Primary osteoarthritis, unspecified shoulder: Secondary | ICD-10-CM | POA: Diagnosis not present

## 2016-12-21 DIAGNOSIS — R197 Diarrhea, unspecified: Secondary | ICD-10-CM | POA: Insufficient documentation

## 2016-12-21 DIAGNOSIS — F17200 Nicotine dependence, unspecified, uncomplicated: Secondary | ICD-10-CM | POA: Diagnosis not present

## 2016-12-21 DIAGNOSIS — M1990 Unspecified osteoarthritis, unspecified site: Secondary | ICD-10-CM | POA: Diagnosis not present

## 2016-12-21 DIAGNOSIS — Z720 Tobacco use: Secondary | ICD-10-CM | POA: Diagnosis not present

## 2016-12-21 DIAGNOSIS — R0600 Dyspnea, unspecified: Secondary | ICD-10-CM | POA: Diagnosis present

## 2016-12-21 DIAGNOSIS — J449 Chronic obstructive pulmonary disease, unspecified: Secondary | ICD-10-CM | POA: Diagnosis present

## 2016-12-21 DIAGNOSIS — M47812 Spondylosis without myelopathy or radiculopathy, cervical region: Secondary | ICD-10-CM | POA: Insufficient documentation

## 2016-12-21 LAB — URINALYSIS, ROUTINE W REFLEX MICROSCOPIC
BILIRUBIN URINE: NEGATIVE
GLUCOSE, UA: NEGATIVE mg/dL
Hgb urine dipstick: NEGATIVE
KETONES UR: 5 mg/dL — AB
LEUKOCYTES UA: NEGATIVE
NITRITE: NEGATIVE
PROTEIN: NEGATIVE mg/dL
Specific Gravity, Urine: 1.018 (ref 1.005–1.030)
pH: 6 (ref 5.0–8.0)

## 2016-12-21 LAB — I-STAT ARTERIAL BLOOD GAS, ED
Acid-base deficit: 1 mmol/L (ref 0.0–2.0)
Bicarbonate: 22.8 mmol/L (ref 20.0–28.0)
O2 Saturation: 86 %
PCO2 ART: 32.8 mmHg (ref 32.0–48.0)
PH ART: 7.449 (ref 7.350–7.450)
TCO2: 24 mmol/L (ref 22–32)
pO2, Arterial: 47 mmHg — ABNORMAL LOW (ref 83.0–108.0)

## 2016-12-21 LAB — I-STAT TROPONIN, ED
Troponin i, poc: 0 ng/mL (ref 0.00–0.08)
Troponin i, poc: 0 ng/mL (ref 0.00–0.08)
Troponin i, poc: 0 ng/mL (ref 0.00–0.08)

## 2016-12-21 LAB — I-STAT BETA HCG BLOOD, ED (MC, WL, AP ONLY)

## 2016-12-21 LAB — CBC WITH DIFFERENTIAL/PLATELET
Basophils Absolute: 0 10*3/uL (ref 0.0–0.1)
Basophils Relative: 0 %
Eosinophils Absolute: 0.1 10*3/uL (ref 0.0–0.7)
Eosinophils Relative: 1 %
HEMATOCRIT: 39.2 % (ref 36.0–46.0)
HEMOGLOBIN: 13 g/dL (ref 12.0–15.0)
LYMPHS ABS: 2.9 10*3/uL (ref 0.7–4.0)
LYMPHS PCT: 26 %
MCH: 33.2 pg (ref 26.0–34.0)
MCHC: 33.2 g/dL (ref 30.0–36.0)
MCV: 100.3 fL — AB (ref 78.0–100.0)
Monocytes Absolute: 0.6 10*3/uL (ref 0.1–1.0)
Monocytes Relative: 6 %
NEUTROS PCT: 67 %
Neutro Abs: 7.6 10*3/uL (ref 1.7–7.7)
Platelets: 320 10*3/uL (ref 150–400)
RBC: 3.91 MIL/uL (ref 3.87–5.11)
RDW: 12.6 % (ref 11.5–15.5)
WBC: 11.3 10*3/uL — AB (ref 4.0–10.5)

## 2016-12-21 LAB — BASIC METABOLIC PANEL
Anion gap: 9 (ref 5–15)
BUN: 7 mg/dL (ref 6–20)
CHLORIDE: 100 mmol/L — AB (ref 101–111)
CO2: 28 mmol/L (ref 22–32)
Calcium: 9.8 mg/dL (ref 8.9–10.3)
Creatinine, Ser: 0.73 mg/dL (ref 0.44–1.00)
GFR calc Af Amer: >60 mL/min (ref >60)
GFR calc non Af Amer: >60 mL/min (ref >60)
GLUCOSE: 142 mg/dL — AB (ref 65–99)
POTASSIUM: 4 mmol/L (ref 3.5–5.1)
Sodium: 137 mmol/L (ref 135–145)

## 2016-12-21 LAB — I-STAT CG4 LACTIC ACID, ED
LACTIC ACID, VENOUS: 1.25 mmol/L (ref 0.5–1.9)
Lactic Acid, Venous: 1.04 mmol/L (ref 0.5–1.9)

## 2016-12-21 MED ORDER — THIAMINE HCL 100 MG/ML IJ SOLN: 100.0000 mg | Freq: Every day | INTRAMUSCULAR | Status: DC

## 2016-12-21 MED ORDER — HYDROCHLOROTHIAZIDE 25 MG PO TABS: 25.0000 mg | ORAL_TABLET | Freq: Every day | ORAL | Status: DC

## 2016-12-21 MED ORDER — AZITHROMYCIN 250 MG PO TABS
500.0000 mg | ORAL_TABLET | Freq: Every day | ORAL | Status: AC
  Administered 2016-12-21: 500 mg via ORAL
  Filled 2016-12-21: qty 2

## 2016-12-21 MED ORDER — SODIUM CHLORIDE 0.9 % IV BOLUS (SEPSIS)
1000.0000 mL | Freq: Once | INTRAVENOUS | Status: AC
  Administered 2016-12-21: 1000 mL via INTRAVENOUS

## 2016-12-21 MED ORDER — IPRATROPIUM-ALBUTEROL 0.5-2.5 (3) MG/3ML IN SOLN
3.0000 mL | RESPIRATORY_TRACT | Status: DC
  Administered 2016-12-21 – 2016-12-23 (×6): 3 mL via RESPIRATORY_TRACT
  Filled 2016-12-21 (×6): qty 3

## 2016-12-21 MED ORDER — FOLIC ACID 1 MG PO TABS
1.0000 mg | ORAL_TABLET | Freq: Every day | ORAL | Status: DC
  Administered 2016-12-22 – 2016-12-23 (×2): 1 mg via ORAL
  Filled 2016-12-21 (×2): qty 1

## 2016-12-21 MED ORDER — VITAMIN B-1 100 MG PO TABS
100.0000 mg | ORAL_TABLET | Freq: Every day | ORAL | Status: DC
Start: 2016-12-22 — End: 2016-12-23
  Administered 2016-12-22 – 2016-12-23 (×2): 100 mg via ORAL
  Filled 2016-12-21 (×2): qty 1

## 2016-12-21 MED ORDER — LORAZEPAM 1 MG PO TABS: 1.0000 mg | ORAL_TABLET | Freq: Four times a day (QID) | ORAL | Status: DC | PRN

## 2016-12-21 MED ORDER — ALBUTEROL SULFATE (2.5 MG/3ML) 0.083% IN NEBU
10.0000 mg | INHALATION_SOLUTION | Freq: Once | RESPIRATORY_TRACT | Status: DC
Start: 2016-12-21 — End: 2016-12-21
  Filled 2016-12-21: qty 12

## 2016-12-21 MED ORDER — ALBUTEROL SULFATE (2.5 MG/3ML) 0.083% IN NEBU
2.5000 mg | INHALATION_SOLUTION | RESPIRATORY_TRACT | Status: DC | PRN
  Administered 2016-12-22: 2.5 mg via RESPIRATORY_TRACT
  Filled 2016-12-21 (×2): qty 3

## 2016-12-21 MED ORDER — ALBUTEROL (5 MG/ML) CONTINUOUS INHALATION SOLN
10.0000 mg/h | INHALATION_SOLUTION | RESPIRATORY_TRACT | Status: DC
  Administered 2016-12-21: 10 mg/h via RESPIRATORY_TRACT
  Filled 2016-12-21: qty 20

## 2016-12-21 MED ORDER — IPRATROPIUM-ALBUTEROL 0.5-2.5 (3) MG/3ML IN SOLN
3.0000 mL | Freq: Once | RESPIRATORY_TRACT | Status: AC
  Administered 2016-12-21: 3 mL via RESPIRATORY_TRACT
  Filled 2016-12-21: qty 3

## 2016-12-21 MED ORDER — GUAIFENESIN ER 600 MG PO TB12
600.0000 mg | ORAL_TABLET | Freq: Two times a day (BID) | ORAL | Status: DC
  Administered 2016-12-21 – 2016-12-23 (×4): 600 mg via ORAL
  Filled 2016-12-21 (×4): qty 1

## 2016-12-21 MED ORDER — LORAZEPAM 2 MG/ML IJ SOLN: 1.0000 mg | Freq: Four times a day (QID) | INTRAMUSCULAR | Status: DC | PRN

## 2016-12-21 MED ORDER — ADULT MULTIVITAMIN W/MINERALS CH
1.0000 | ORAL_TABLET | Freq: Every day | ORAL | Status: DC
  Administered 2016-12-22 – 2016-12-23 (×2): 1 via ORAL
  Filled 2016-12-21 (×2): qty 1

## 2016-12-21 MED ORDER — AZITHROMYCIN 500 MG PO TABS
250.0000 mg | ORAL_TABLET | Freq: Every day | ORAL | Status: DC
  Administered 2016-12-22 – 2016-12-23 (×2): 250 mg via ORAL
  Filled 2016-12-21 (×2): qty 1

## 2016-12-21 MED ORDER — DEXAMETHASONE SODIUM PHOSPHATE 10 MG/ML IJ SOLN
10.0000 mg | Freq: Once | INTRAMUSCULAR | Status: AC
  Administered 2016-12-21: 10 mg via INTRAVENOUS
  Filled 2016-12-21: qty 1

## 2016-12-21 MED ORDER — ENOXAPARIN SODIUM 40 MG/0.4ML ~~LOC~~ SOLN
40.0000 mg | SUBCUTANEOUS | Status: DC
  Administered 2016-12-22 – 2016-12-23 (×2): 40 mg via SUBCUTANEOUS
  Filled 2016-12-21 (×3): qty 0.4

## 2016-12-21 MED ORDER — IOPAMIDOL (ISOVUE-370) INJECTION 76%
INTRAVENOUS | Status: AC
  Administered 2016-12-21: 100 mL
  Filled 2016-12-21: qty 100

## 2016-12-21 NOTE — H&P (Signed)
[facility] Admission History and Physical Service Pager: [phone]  Patient name: [name] Medical record number: [id number] Date of birth: 1961-01-19 Age: 56 y.o. Gender: female  Primary Care Provider: Eloise Levels, MD Consultants: none Code Status: full (obtained on admission)  Chief Complaint: Shortness of breath  Assessment and Plan: AMYLAH WILL is a 56 y.o. female presenting with shortness of breath . PMH is significant for hypertension, tobacco use disorder, alcohol use and osteoarthritis  Shortness of breath/cough: likely due to exacerbation of underlying undiagnosed COPD in the setting of viral URI. Patient with mild increased work of breathing, inspiratory wheeze and rhonchi bilaterally. CXR with hyperinflated lungs. She is a chronic smoker for about 40 years. ABG basically normal. History and exam not consistent with CHF. PE ruled out by CTA. CXR without focal infiltration. EKG with tachycardia and some T-wave changes in inferior and anterior leads but patient without chest pain and significant cardiac history for ACS. HEART score 3 (hypertension, age and T-wave changes).  -Admit to MedSurg for observation overnight. Attending Dr. Mingo Amber -Status post Decadron 10 mg, CATx1 and DuoNeb in ED -Continue DuoNeb every 4 hours. Can be transitioned to LAMA when improved -Albuterol nebulizer every 2 hours when necessary -Z-Pak. EKG without QTC prolongation -Mucinex -Continuous pulse ox -May PFT as an outpatient -Oxygen to keep O2 sat greater than 92%  Emesis/diarrhea/abdominal pain: Resolved. Likely due to viral infection. No further emesis or diarrhea today. Abdominal exam within normal limits. -We'll continue to monitor  Tachycardia: Likely due to dyspnea and albuterol. She is not anemic. Last TSH on her years ago was normal limits. Doesn't seem to be withdrawing from alcohol -We'll continue to monitor -Repeat TSH  Hypertension:  Hypertensive to 179/95 on arrival to ED. Now normotensive to 134/65 without intervention. Had her last dose of hydrochlorothiazide this morning. -Continue home hydrochlorothiazide in the morning.  Tobacco use disorder: Currently smoker. Half a pack a day. About 20-pack-year history. Interested in quitting but not interested in nicotine patch -We'll benefit from counseling  Alcohol use: reports drinking about 4 of 16 ounce beers a day. Last drink 2 days ago. No history of withdrawal. 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 Full range of motion in the shoulders and next. -Tylenol as needed for pain.   FEN/GI: -Heart healthy diet  Prophylaxis: Lovenox  Disposition: Admit to [facility] for treatment of possible COPD exacerbation.   Patient lives with her son. She says he is aware of her admission.  History of Present Illness:  [name] is a 56 y.o. female presenting with shortness of breath  Patient reports having runny nose last week. Then about 2 days ago she had stomach pain that went away. Yesterday, she had two episodes of emesis and diarrhea. Emesis was just food content without blood or bile. The diarrhea was a kind of loose and watery but without hematochezia or melena.  This morning, she started having shortness of breath with movement and at rest. She also reports productive cough with whitish phlegm. She denies hemoptysis, fever, chills, chest pain, orthopnea or leg swelling. She tried Mucinex at home for cough. She denies history of asthma or COPD. However, she admits smoking about 7 cigarettes a day for the last 40 years. She says she never been on breathing treatment nor ever had shortness of breath.  Patient works in [employer]. She admits drinking about 4 16 ounce beers a day. Last drink  was about 2 days ago. Denies history of alcohol withdrawal. She denies recreational drug use. Patient is not interested in nicotine  patch at this time. However, she is interested in quitting smoking.  ED course: Vital signs significant for hypertension to 179/95, RR 22, HR 108 and oxygen saturation to 92% on room air on arrival to ED. Per ED provider, patient with some increased work of breathing, wheeze and rhonchi. She was given dexamethasone 10 mg, continuous albuterol and DuoNeb without significant improvement. Initial troponin negative 2. EKG with some T-wave changes in inferior and anterior leads. BMP,  CBC and UA basically unremarkable except for mild leukocytosis to 11. CTA chest without PE but with bilateral diffuse groundglass patchy opacities concerning for inflammatory or infectious pneumonitis. Chest x-ray without infiltration of pulmonary edema but concerning for hyperinflation and mild diaphragmatic flattening.  Family medicine was called for admission because patient's respiratory status and lung exam didn't improve after the above treatments. Review Of Systems:   Review of Systems  Constitutional: Negative for fever and weight loss.  Eyes: Negative for photophobia and pain.  Respiratory: Positive for cough, sputum production, shortness of breath and wheezing. Negative for hemoptysis.   Cardiovascular: Negative for chest pain, palpitations, orthopnea, leg swelling and PND.  Gastrointestinal: Positive for abdominal pain, diarrhea and vomiting. Negative for blood in stool and melena.  Genitourinary: Negative for dysuria and hematuria.  Musculoskeletal: Positive for joint pain and neck pain. Negative for myalgias.  Skin: Negative for rash.  Neurological: Negative for speech change, focal weakness, weakness and headaches.  Endo/Heme/Allergies: Does not bruise/bleed easily.  Psychiatric/Behavioral: Negative for depression and substance abuse. The patient is not nervous/anxious.     Patient Active Problem List   Diagnosis Date Noted  . Thumb pain, right 09/02/2016  . Tobacco abuse 10/14/2014  . Right arm  numbness 04/07/2014  . Pain in joint, shoulder region 06/11/2013  . Pain in joint, lower leg 06/11/2013  . Hypertension 03/08/2012  . Neck pain on right side 03/08/2012  . Foot pain 03/08/2012    Past Medical History: Past Medical History:  Diagnosis Date  . Hypertension 2005    Past Surgical History: Past Surgical History:  Procedure Laterality Date  . AXILLARY LYMPH NODE BIOPSY    . BREAST BIOPSY    . DIAGNOSTIC LAPAROSCOPY     for persistant right ovarian cyst    Social History: Social History  Substance Use Topics  . Smoking status: Current Every Day Smoker    Packs/day: 0.50    Years: 35.00    Types: Cigarettes  . Smokeless tobacco: Never Used  . Alcohol use 5.0 oz/week    10 Standard drinks or equivalent per week   Additional social history: Per history of present illness Please also refer to relevant sections of EMR.  Family History: Family History  Problem Relation Age of Onset  . Heart disease Mother   . Diabetes Mellitus II Mother   . Hypertension Mother   . Prostate cancer Father   . Heart disease Father   . Diabetes Mellitus II Brother   . Hypertension Brother   . Diabetes Mellitus II Brother   . Hyperlipidemia Sister    (If not completed, MUST add something in)  Allergies and Medications: No Known Allergies No current facility-administered medications on file prior to encounter.    Current Outpatient Prescriptions on File Prior to Encounter  Medication Sig Dispense Refill  . hydrochlorothiazide (HYDRODIURIL) 25 MG tablet Take 1 tablet (25 mg total) by mouth  daily. 30 tablet 11  . meloxicam (MOBIC) 7.5 MG tablet Take 1 tablet (7.5 mg total) by mouth daily. (Patient not taking: Reported on 12/21/2016) 20 tablet 0  . methocarbamol (ROBAXIN) 500 MG tablet Take 1 tablet (500 mg total) by mouth 2 (two) times daily. (Patient not taking: Reported on 12/21/2016) 20 tablet 0    Objective: BP (!) 144/90   Pulse (!) 124   Temp 98.3 F (36.8 C) (Oral)    Resp (!) 26   Ht 5\' 7"  (1.702 m)   Wt 125 lb (56.7 kg)   LMP 04/12/2004   SpO2 93%   BMI 19.58 kg/m  Exam: GEN: appears well, no apparent distress. Head: normocephalic and atraumatic  Eyes: conjunctiva without injection, sclera anicteric. Arcus senilis. PERRL, EOMI  Oropharynx: mmm without erythema or exudation HEM: negative for cervical or periauricular lymphadenopathies CVS: Tachycardic, RR, nl s1 & s2, no murmurs, no edema,  2+ DP & PT pulses bilaterally RESP: mild IWOB, fair air movement bilaterally, inspiratory wheezes bilaterally, rhonchi bilaterally, no crackles. GI: BS present & normal, soft, NTND GU: no suprapubic or CVA tenderness MSK: no focal tenderness or notable swelling. Full range of motion in his shoulders and neck. Mild tenderness to palpation over trapezius muscles bilaterally. SKIN: no apparent skin lesion ENDO: negative thyromegally NEURO: alert and oiented appropriately, no gross deficits  PSYCH: euthymic mood with congruent affect  Labs and Imaging: CBC BMET   Recent Labs Lab 12/21/16 1146  WBC 11.3*  HGB 13.0  HCT 39.2  PLT 320    Recent Labs Lab 12/21/16 1146  NA 137  K 4.0  CL 100*  CO2 28  BUN 7  CREATININE 0.73  GLUCOSE 142*  CALCIUM 9.8     Dg Chest 2 View  Result Date: 12/21/2016 CLINICAL DATA:  Cough and shortness of breath . EXAM: CHEST  2 VIEW COMPARISON:  06/25/2015 FINDINGS: Mediastinum and hilar structures normal. Lungs are clear. Heart size normal. No pleural effusion or pneumothorax. Scratch IMPRESSION: No acute cardiopulmonary disease. Electronically Signed   By: Marcello Moores  Register   On: 12/21/2016 12:16   Ct Angio Chest Pe W/cm &/or Wo Cm  Result Date: 12/21/2016 CLINICAL DATA:  Evaluate for acute pulmonary embolus. EXAM: CT ANGIOGRAPHY CHEST WITH CONTRAST TECHNIQUE: Multidetector CT imaging of the chest was performed using the standard protocol during bolus administration of intravenous contrast. Multiplanar CT image  reconstructions and MIPs were obtained to evaluate the vascular anatomy. CONTRAST:  100 cc of Isovue 370 COMPARISON:  None. FINDINGS: Cardiovascular: The heart size appears within normal limits. No pericardial effusion. Mild aortic atherosclerosis. The main pulmonary artery appears patent. There is no lobar or segmental pulmonary artery filling defects to suggest pulmonary embolus. Mediastinum/Nodes: The trachea appears patent and is midline. Normal appearance of the esophagus. No hilar or mediastinal adenopathy. The trachea is unremarkable. No axillary or supraclavicular adenopathy. Lungs/Pleura: There is mild diffuse bronchial wall thickening noted. Multifocal patchy no suspicious pulmonary nodules identified. Areas of ground-glass attenuation are scattered throughout both lungs. No airspace consolidation identified. Upper Abdomen: No acute abnormality. Musculoskeletal: No chest wall abnormality. No acute or significant osseous findings. Review of the MIP images confirms the above findings. IMPRESSION: 1. No evidence for acute pulmonary embolus. 2. Bilateral multifocal patchy areas of ground-glass attenuation compatible with inflammatory or infectious pneumonitis. 3.  Aortic Atherosclerosis (ICD10-I70.0). Electronically Signed   By: Kerby Moors M.D.   On: 12/21/2016 18:22    Mercy Riding, MD 12/21/2016, 8:41 PM PGY-3,  [place] [facility] pager: [phone], text pages welcome

## 2016-12-21 NOTE — ED Notes (Signed)
Patient continues to wheeze after breathing treatment

## 2016-12-21 NOTE — ED Notes (Signed)
Pt ambulatory to restroom

## 2016-12-21 NOTE — ED Triage Notes (Signed)
Patient complains of cough and shortness of breath increased x 2-3 days with productive cough. Denies CP but reports that her breathing is worse with exertion, smoker

## 2016-12-21 NOTE — ED Provider Notes (Signed)
[facility] DEPT Provider Note   CSN: [id number] Arrival date & time: 12/21/16  1140     History   Chief Complaint No chief complaint on file.   HPI [name] is a 56 y.o. female with a history of hypertension who presents today for evaluation of shortness of breath that began Monday at 1 AM. She reports that recently she has had a cold with nasal congestion however that has improved and now she just has shortness of breath, cough.  She reports that her cough is productive.  She admits to smoking 6 cigarettes a day for the past 10-15 years.  She denies any chest pain, abdominal pain, nausea/vomiting, or diarrhea. No pain with urinating or difficulty urinating.  She reports good PO intake.  No fevers or chills.  She can not move around with out getting short of breath.  She has not been previously diagnosed with COPD or other smoking related diseases.   HPI  Past Medical History:  Diagnosis Date  . Hypertension 2005    Patient Active Problem List   Diagnosis Date Noted  . Thumb pain, right 09/02/2016  . Tobacco abuse 10/14/2014  . Right arm numbness 04/07/2014  . Pain in joint, shoulder region 06/11/2013  . Pain in joint, lower leg 06/11/2013  . Hypertension 03/08/2012  . Neck pain on right side 03/08/2012  . Foot pain 03/08/2012    Past Surgical History:  Procedure Laterality Date  . AXILLARY LYMPH NODE BIOPSY    . BREAST BIOPSY    . DIAGNOSTIC LAPAROSCOPY     for persistant right ovarian cyst    OB History    Gravida Para Term Preterm AB Living   3 2 1 1 1      SAB TAB Ectopic Multiple Live Births   1               Home Medications    Prior to Admission medications   Medication Sig Start Date End Date Taking? Authorizing Provider  acetaminophen (TYLENOL) 500 MG tablet Take 1,000 mg by mouth every 6 (six) hours as needed for mild pain.   Yes [provider]  guaiFENesin (ROBITUSSIN) 100 MG/5ML liquid Take 200 mg by mouth 3 (three) times daily  as needed for cough.   Yes [provider]  hydrochlorothiazide (HYDRODIURIL) 25 MG tablet Take 1 tablet (25 mg total) by mouth daily. 12/20/16  Yes Eloise Levels, MD  meloxicam (MOBIC) 7.5 MG tablet Take 1 tablet (7.5 mg total) by mouth daily. Patient not taking: Reported on 12/21/2016 11/14/16   Caccavale, Sophia, PA-C  methocarbamol (ROBAXIN) 500 MG tablet Take 1 tablet (500 mg total) by mouth 2 (two) times daily. Patient not taking: Reported on 12/21/2016 11/14/16   Franchot Heidelberg, PA-C    Family History Family History  Problem Relation Age of Onset  . Heart disease Mother   . Diabetes Mellitus II Mother   . Hypertension Mother   . Prostate cancer Father   . Heart disease Father   . Diabetes Mellitus II Brother   . Hypertension Brother   . Diabetes Mellitus II Brother   . Hyperlipidemia Sister     Social History Social History  Substance Use Topics  . Smoking status: Current Every Day Smoker    Packs/day: 0.50    Years: 35.00    Types: Cigarettes  . Smokeless tobacco: Never Used  . Alcohol use 5.0 oz/week    10 Standard drinks or equivalent per week  Allergies   Patient has no known allergies.   Review of Systems Review of Systems  Constitutional: Negative for chills and fever.  HENT: Negative for ear pain, postnasal drip, sinus pressure and sore throat.   Eyes: Negative for pain and visual disturbance.  Respiratory: Positive for cough and shortness of breath. Negative for chest tightness.   Cardiovascular: Negative for chest pain, palpitations and leg swelling.  Gastrointestinal: Negative for abdominal pain, nausea and vomiting.  Genitourinary: Negative for dysuria and hematuria.  Musculoskeletal: Negative for arthralgias and back pain.  Skin: Negative for color change and rash.  Neurological: Negative for seizures, syncope and headaches.  All other systems reviewed and are negative.    Physical Exam Updated Vital Signs BP (!) 169/95 (BP  Location: Left Arm)   Pulse 92   Temp 98.3 F (36.8 C) (Oral)   Resp (!) 37   Ht 5\' 7"  (1.702 m)   Wt 56.7 kg (125 lb)   LMP 04/12/2004   SpO2 94%   BMI 19.58 kg/m   Physical Exam  Constitutional: She appears well-developed and well-nourished.  HENT:  Head: Normocephalic and atraumatic.  Eyes: Conjunctivae are normal. Right eye exhibits no discharge. Left eye exhibits no discharge. No scleral icterus.  Neck: Normal range of motion.  Cardiovascular: Normal rate, regular rhythm, normal heart sounds and intact distal pulses.  Exam reveals no friction rub.   No murmur heard. Pulmonary/Chest: No stridor. No respiratory distress. She has no decreased breath sounds. She has wheezes in the right upper field, the right middle field, the right lower field, the left upper field, the left middle field and the left lower field. She has rhonchi in the right upper field, the right middle field, the right lower field, the left upper field, the left middle field and the left lower field.  Abdominal: Soft. Bowel sounds are normal. She exhibits no distension. There is no tenderness.  Musculoskeletal: She exhibits no edema or deformity.  Neurological: She is alert. She exhibits normal muscle tone.  Skin: Skin is warm and dry. She is not diaphoretic.  Psychiatric: She has a normal mood and affect. Her behavior is normal.  Nursing note and vitals reviewed.    ED Treatments / Results  Labs (all labs ordered are listed, but only abnormal results are displayed) Labs Reviewed  CBC WITH DIFFERENTIAL/PLATELET - Abnormal; Notable for the following:       Result Value   WBC 11.3 (*)    MCV 100.3 (*)    All other components within normal limits  BASIC METABOLIC PANEL - Abnormal; Notable for the following:    Chloride 100 (*)    Glucose, Bld 142 (*)    All other components within normal limits  URINALYSIS, ROUTINE W REFLEX MICROSCOPIC - Abnormal; Notable for the following:    Ketones, ur 5 (*)    All  other components within normal limits  I-STAT ARTERIAL BLOOD GAS, ED - Abnormal; Notable for the following:    pO2, Arterial 47.0 (*)    All other components within normal limits  TSH  CBC  CREATININE, SERUM  I-STAT TROPONIN, ED  I-STAT CG4 LACTIC ACID, ED  I-STAT BETA HCG BLOOD, ED ([facility], [facility], [facility] ONLY)  I-STAT TROPONIN, ED  I-STAT CG4 LACTIC ACID, ED  I-STAT TROPONIN, ED    EKG  EKG Interpretation None       Radiology Dg Chest 2 View  Result Date: 12/21/2016 CLINICAL DATA:  Cough and shortness of breath . EXAM: CHEST  2 VIEW COMPARISON:  06/25/2015 FINDINGS: Mediastinum and hilar structures normal. Lungs are clear. Heart size normal. No pleural effusion or pneumothorax. Scratch IMPRESSION: No acute cardiopulmonary disease. Electronically Signed   By: Marcello Moores  Register   On: 12/21/2016 12:16   Ct Angio Chest Pe W/cm &/or Wo Cm  Result Date: 12/21/2016 CLINICAL DATA:  Evaluate for acute pulmonary embolus. EXAM: CT ANGIOGRAPHY CHEST WITH CONTRAST TECHNIQUE: Multidetector CT imaging of the chest was performed using the standard protocol during bolus administration of intravenous contrast. Multiplanar CT image reconstructions and MIPs were obtained to evaluate the vascular anatomy. CONTRAST:  100 cc of Isovue 370 COMPARISON:  None. FINDINGS: Cardiovascular: The heart size appears within normal limits. No pericardial effusion. Mild aortic atherosclerosis. The main pulmonary artery appears patent. There is no lobar or segmental pulmonary artery filling defects to suggest pulmonary embolus. Mediastinum/Nodes: The trachea appears patent and is midline. Normal appearance of the esophagus. No hilar or mediastinal adenopathy. The trachea is unremarkable. No axillary or supraclavicular adenopathy. Lungs/Pleura: There is mild diffuse bronchial wall thickening noted. Multifocal patchy no suspicious pulmonary nodules identified. Areas of ground-glass attenuation are scattered throughout both lungs. No  airspace consolidation identified. Upper Abdomen: No acute abnormality. Musculoskeletal: No chest wall abnormality. No acute or significant osseous findings. Review of the MIP images confirms the above findings. IMPRESSION: 1. No evidence for acute pulmonary embolus. 2. Bilateral multifocal patchy areas of ground-glass attenuation compatible with inflammatory or infectious pneumonitis. 3.  Aortic Atherosclerosis (ICD10-I70.0). Electronically Signed   By: Kerby Moors M.D.   On: 12/21/2016 18:22    Procedures Procedures (including critical care time)  Medications Ordered in ED Medications  sodium chloride 0.9 % bolus 1,000 mL (0 mLs Intravenous Stopped 12/21/16 1839)  ipratropium-albuterol (DUONEB) 0.5-2.5 (3) MG/3ML nebulizer solution 3 mL (3 mLs Nebulization Given 12/21/16 1622)  iopamidol (ISOVUE-370) 76 % injection (100 mLs  Contrast Given 12/21/16 1756)  dexamethasone (DECADRON) injection 10 mg (10 mg Intravenous Given 12/21/16 1945)     Initial Impression / Assessment and Plan / ED Course  I have reviewed the triage vital signs and the nursing notes.  Pertinent labs & imaging results that were available during my care of the patient were reviewed by me and considered in my medical decision making (see chart for details).  Clinical Course as of Dec 22 99  Tue Dec 21, 2016  1733 Patient reassessed, is not having to work as hard to breathe and states she feels a little bit better after DuoNeb however she still has diffuse wheezes and rhonchi.   [EH]  1918 Patient re-checked.  Still on hour long neb.  Lung sounds unchanged.   [EH]  2027 Spoke with Family medicine who requested an ABG, will come see patient.   [EH]    Clinical Course User Index [EH] Lorin Glass, PA-C   [name] presents today for evaluation of shortness of breath and cough. She has a smoking history however has not been previously diagnosed with COPD. On exam she had diffuse wheezes and rhonchi. DuoNeb  was ordered which did not change her pulmonary exam. Hour-long nebulizer was ordered along with steroid, and hospitalist was consulted for admission.  PE was considered as patient was tachycardic, tachypneic, and hypoxic.  CTA chest was ordered which did not show any obvious evidence of PE.  EKG and troponin's were reviewed, no acute abnormalities.  All patient does not have a history of COPD, I am suspicious that she may have had a  virus that uncovered her undiagnosed COPD.  I feel that her symptoms represent acute exacerbation of COPD.  Family medicine was consulted for admission, and agreed to see and admit the patient.   The patient appears reasonably stabilized for admission considering the current resources, flow, and capabilities available in the ED at this time, and I doubt any other [facility] requiring further screening and/or treatment in the ED prior to admission.   Final Clinical Impressions(s) / ED Diagnoses   Final diagnoses:  Shortness of breath    New Prescriptions New Prescriptions   No medications on file     Ollen Gross 12/22/16 0111    Davonna Belling, MD 12/23/16 [phone]

## 2016-12-22 DIAGNOSIS — Z789 Other specified health status: Secondary | ICD-10-CM | POA: Diagnosis not present

## 2016-12-22 DIAGNOSIS — F1010 Alcohol abuse, uncomplicated: Secondary | ICD-10-CM | POA: Diagnosis not present

## 2016-12-22 DIAGNOSIS — Z7289 Other problems related to lifestyle: Secondary | ICD-10-CM | POA: Insufficient documentation

## 2016-12-22 DIAGNOSIS — Z720 Tobacco use: Secondary | ICD-10-CM

## 2016-12-22 DIAGNOSIS — R0602 Shortness of breath: Secondary | ICD-10-CM | POA: Diagnosis not present

## 2016-12-22 LAB — CBC
HEMATOCRIT: 33.6 % — AB (ref 36.0–46.0)
Hemoglobin: 11.2 g/dL — ABNORMAL LOW (ref 12.0–15.0)
MCH: 33.1 pg (ref 26.0–34.0)
MCHC: 33.3 g/dL (ref 30.0–36.0)
MCV: 99.4 fL (ref 78.0–100.0)
PLATELETS: 269 10*3/uL (ref 150–400)
RBC: 3.38 MIL/uL — ABNORMAL LOW (ref 3.87–5.11)
RDW: 12.6 % (ref 11.5–15.5)
WBC: 10.4 10*3/uL (ref 4.0–10.5)

## 2016-12-22 LAB — TSH: TSH: 0.485 u[IU]/mL (ref 0.350–4.500)

## 2016-12-22 LAB — CREATININE, SERUM: Creatinine, Ser: 0.8 mg/dL (ref 0.44–1.00)

## 2016-12-22 MED ORDER — NICOTINE 14 MG/24HR TD PT24
14.0000 mg | MEDICATED_PATCH | Freq: Every day | TRANSDERMAL | Status: DC
  Administered 2016-12-22 – 2016-12-23 (×2): 14 mg via TRANSDERMAL
  Filled 2016-12-22 (×2): qty 1

## 2016-12-22 MED ORDER — PNEUMOCOCCAL VAC POLYVALENT 25 MCG/0.5ML IJ INJ
0.5000 mL | INJECTION | INTRAMUSCULAR | Status: AC
Start: 2016-12-23 — End: 2016-12-23
  Administered 2016-12-23: 0.5 mL via INTRAMUSCULAR
  Filled 2016-12-22: qty 0.5

## 2016-12-22 MED ORDER — TIOTROPIUM BROMIDE MONOHYDRATE 18 MCG IN CAPS
18.0000 ug | ORAL_CAPSULE | Freq: Every day | RESPIRATORY_TRACT | Status: DC
  Filled 2016-12-22: qty 5

## 2016-12-22 MED ORDER — PREDNISONE 20 MG PO TABS
40.0000 mg | ORAL_TABLET | Freq: Every day | ORAL | Status: DC
  Administered 2016-12-23: 40 mg via ORAL
  Filled 2016-12-22: qty 2

## 2016-12-22 NOTE — Progress Notes (Signed)
Family Medicine Teaching Service Daily Progress Note Intern Pager: [phone]  Patient name: [name] Medical record number: [id number] Date of birth: Oct 12, 1960 Age: 56 y.o. Gender: female  Primary Care Provider: Eloise Levels, MD Consultants: none Code Status: FULL  Pt Overview and Major Events to Date:  [name] is a 56 y.o. female presenting with shortness of breath . PMH is significant for hypertension, tobacco use disorder, alcohol use and osteoarthritis.  9/11 - CXR and CT Angio Chest  Assessment and Plan:  Shortness of breath/cough: likely due to exacerbation of underlying undiagnosed COPD in the setting of viral URI. Patient has had bronchitis in the past so could be URI developing into viral bronchitis. Patient is feeling better and states the breathing treatments have helped. She is a chronic smoker for about 40 years. History and exam not consistent with CHF. PE ruled out by CTA. CXR without focal infiltration. EKG with tachycardia and some T-wave changes in inferior and anterior leads but patient without chest pain and significant cardiac history for ACS. HEART score 3 (hypertension, age and T-wave changes). -Status post Decadron 10 mg, CATx1 and DuoNeb x 1 in ED -Start Prednisone 40mg  daily x 5 days ending on 9/16 -Albuterol nebulizer every 2 hours as needed  -Discharge her on home med of SABA+anticholinergic (albuterol + tiotropium) -Z-Pak started on 9/11 to end on 9/15 for total of 5 days -Mucinex -Have nursing get ambulatory pulse ox this afternoon, if >92% and she is asymptomatic she can be safely discharged -May PFT as an outpatient -Oxygen to keep O2 sat greater than 92%  Emesis/diarrhea/abdominal pain: Resolved. Likely due to viral infection. No further emesis. One episode of lose stool/diarrhea today. Abdominal exam within normal limits. -Continue to monitor  Tachycardia: Likely due to dyspnea and albuterol. She is not anemic. Last TSH on her years  ago was normal limits. Doesn't seem to be withdrawing from alcohol. -Continue to monitor vitals on floor -TSH normal  Hypertension: controlled Hypertensive to 179/95 on arrival now normotensive to 105-134/60-70 since about 8pm without intervention.  -Continue hydrochlorothiazide daily. -Continue to monitor  Tobacco use disorder: Currently smoker. Half a pack a day. About 20-pack-year history. Interested in quitting but not interested in nicotine patch -Provided counseling -start nicotine patch 14mg  -will need extensive support and counseling outpatient. Consider starting Buproprion outpatient.  Alcohol use: reports drinking about 4 of 16 ounce beers a day. Last drink 2 days ago. No history of withdrawal. 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 Full range of motion in the shoulders and next. -Tylenol as needed for pain.  FEN/GI: Heart Healthy diet PPx: Lovenox  Disposition: inpatient, possible discharge tomorrow pending clinical improvement  Subjective:  [name] is a 56y/o female with a PMH of tobacco use disorder, HTN, and osteoarthritis. She states she feels better this morning and is breathing comfortably in bed with no SOB at rest. She reports continued cough with non-productive whitish phelgm but improved ease of "getting it out" and no blood. No vomiting since Monday, one episode of lose stool since she came to the ED.   She denies any chest pain, dizziness, syncope or near syncope, nausea, or abdominal pain.  Objective: Temp:  [98.2 F (36.8 C)-98.3 F (36.8 C)] 98.3 F (36.8 C) (09/11 1412) Pulse Rate:  [92-129] 103 (09/12 0200) Resp:  [15-38] 28 (09/12 0200) BP: (105-179)/(60-98) 111/70 (09/12 0200) SpO2:  [89 %-100 %] 91 % (09/12 0200) Weight:  [884  lb (56.7 kg)] 125 lb (56.7 kg) (09/11 1146) Physical Exam:  Gen: Alert and Oriented x 3, NAD HEENT: Normocephalic, atraumatic, PERRLA, EOMI, TM visible with good  light reflex, non-swollen, non-erythematous turbinates, normal pharyngeal mucosa Resp: Decreased breath sounds overall, diffuse wheezing bilaterally, scattered rhonchi in left and right lower lung fields, comfortable work of breathing CV: RRR, no murmurs, normal S1, S2 split, +2 pulses dorsalis pedis bilaterally Abd: non-distended, non-tender, soft, +bs in all four quadrants, no hepatosplenomegaly MSK: FROM in all four extremities Ext: no clubbing, cyanosis, or edema Skin: warm, dry, intact, no rashes  Laboratory:  Recent Labs Lab 12/21/16 1146 12/22/16 0230  WBC 11.3* 10.4  HGB 13.0 11.2*  HCT 39.2 33.6*  PLT 320 269    Recent Labs Lab 12/21/16 1146 12/22/16 0230  NA 137  --   K 4.0  --   CL 100*  --   CO2 28  --   BUN 7  --   CREATININE 0.73 0.80  CALCIUM 9.8  --   GLUCOSE 142*  --    9/11 - U/A: negative for nitrites or leukocytes 9/11 - Troponin: <0.00 x 2 9/11 - beta hCG: <5.0 9/11 - Lacitc Acid: 1.04 9/11 - ABG: pH 7.449, pCO2 32.8, Bicarb 22.8 9/11 - serum Cr 0.80 9/11 - TSH: 0.485  Imaging/Diagnostic Tests: 9/11 - CXR: IMPRESSION: No acute cardiopulmonary disease. 9/11 - EKG: NSR, Normal axis, non-specific T-wave abnormalities, left atrial enlargement 9/11 - CT Angio Chest: 1. No evidence for acute pulmonary embolus. 2. Bilateral multifocal patchy areas of ground-glass attenuation compatible with inflammatory or infectious pneumonitis. 3.  Aortic Atherosclerosis   Nuala Alpha, DO 12/22/2016, 8:02 AM PGY-1, [place] Intern pager: [phone], text pages welcome

## 2016-12-22 NOTE — ED Notes (Signed)
Updated on results

## 2016-12-22 NOTE — ED Notes (Signed)
Patient sleeping

## 2016-12-22 NOTE — ED Notes (Signed)
Patient ambulated to restroom without shortness of breath.

## 2016-12-22 NOTE — ED Notes (Signed)
Patient ambulated in hallways with pulse-ox- pt denies shob, SpO2 maintained over 94%, steady gait, no tacypnea noted.

## 2016-12-22 NOTE — Progress Notes (Signed)
FPTS Interim Progress Note  S: Patient reports she was able to get up and go to the bathroom without any shortness of breath. Overall she states, "she is doing better" and she has had no more episodes of feeling short of breath since this morning.   O:Resp: Ambulatory SpO2 was >90%, Inspiratory wheezing and rhonci bilaterally in lower lung fields, improved work of breathing, still has decreased breath sounds but improved from this morning.  BP (!) 120/59 (BP Location: Left Arm)   Pulse (!) 104   Temp 99 F (37.2 C)   Resp 18   Ht 5\' 7"  (1.702 m)   Wt 125 lb (56.7 kg)   LMP 04/12/2004   SpO2 95%   BMI 19.58 kg/m     A/P: Overall, stable and Improved. Continue to treat as Acute exacerbation of COPD. Reevaluate in the morning.  Nuala Alpha, DO 12/22/2016, 4:13 PM PGY-1, [place] Medicine Service pager [phone]

## 2016-12-22 NOTE — ED Notes (Signed)
Sitting on side of bed eating breakfast no complaints at present.

## 2016-12-22 NOTE — ED Notes (Signed)
Spoke to admitting will keep patient and MD will re-assess. Residents can be paged at [phone]

## 2016-12-22 NOTE — Discharge Summary (Signed)
[facility] Discharge Summary  Patient name: [name] Medical record number: [id number] Date of birth: 15-Sep-1960 Age: 56 y.o. Gender: female Date of Admission: 12/21/2016  Date of Discharge: 12/23/2016 Admitting Physician: No admitting provider for patient encounter.  Primary Care Provider: Eloise Levels, MD Consultants: none  Indication for Hospitalization: SOB with associated cough  Discharge Diagnoses/Problem List:  Acute Exacerbation of COPD Tobacco use disorder Hypertension Osteoarthritis Alcohol use  Disposition: home  Discharge Condition: stable  Discharge Exam:   Gen: Alert and Oriented x 3, NAD Resp: CTA bilaterally, inspiratory and expiratory wheezing throughout bilaterally. No crackles or rhonchi. Decreased air movement in LLL. No increased WOB or retractions noted. CV: RRR, no murmurs/rubs/gallops.  Ext: WWP, no LE edema  Brief Hospital Course:  Mrs. [name] is a 56y/o female with a PMH of hypertension, tobacco use disorder, alcohol use, and osteoarthitis. She presented to the ED on 9/11 due to worsening SOB that got worse whenever she walked. She has been a smoker for about 40 years and has smoked about a 1/2ppd for the past 15+ years. She had a URI about one week ago which did improve until Tuesday evening. In the ED she received Decadron and Duonebs x 1, and her breathing at rest did improve. However, she still did not feel like walking much and still endorsed dyspnea on exertion. In addition, on physical exam she had poor air movement in both lungs with wheezing and rhonchi. She received a CT Chest that showed on PE and her EKG and CXR were both unremarkable for any Cardiopulmonary disease. Given her smoking history, it is likely she has COPD and this was a first exacerbation for her. It was decided that she should be admitted and she was started on Albuterol nebulizer q2 prn, Azithromycin, and muxinex. After receiving  treatment for a day she began to feel better and was able to ambulate without any desats or symptoms of dyspnea. Her air movement in both lungs improved but still had some wheezing on exam. On 9/13 she was discharged in stable condition to her home with instructions to follow up with her PCP for proper follow up care.   Issues for Follow Up:  1. Smoking cessation counseling and consider starting Buproprion to aid in smoking cessation at outpatient visit. 2. Patient has never been worked up for COPD and seems likely she had COPD exacerbation. She will need PFTs. Consider adding Spiriva to her Albuterol if symptoms persist or worsen.  Significant Procedures:  9/11 - CXR: IMPRESSION: No acute cardiopulmonary disease. 9/11 - EKG: NSR, Normal axis, non-specific T-wave abnormalities, left atrial enlargement 9/11 - CT Angio Chest: 1. No evidence for acute pulmonary embolus. 2. Bilateral multifocal patchy areas of ground-glass attenuation compatible with inflammatory or infectious pneumonitis. 3. Aortic Atherosclerosis   Significant Labs and Imaging:   Recent Labs Lab 12/21/16 1146 12/22/16 0230  WBC 11.3* 10.4  HGB 13.0 11.2*  HCT 39.2 33.6*  PLT 320 269    Recent Labs Lab 12/21/16 1146 12/22/16 0230  NA 137  --   K 4.0  --   CL 100*  --   CO2 28  --   GLUCOSE 142*  --   BUN 7  --   CREATININE 0.73 0.80  CALCIUM 9.8  --    9/11 - U/A: negative for nitrites or leukocytes 9/11 - Troponin: <0.00 x 2 9/11 - beta hCG: <5.0 9/11 - Lacitc Acid: 1.04 9/11 - ABG: pH 7.449,  pCO2 32.8, Bicarb 22.8 9/11 - serum Cr 0.80 9/11 - TSH: 0.485  Results/Tests Pending at Time of Discharge: none  Discharge Medications:  Allergies as of 12/23/2016   No Known Allergies     Medication List    STOP taking these medications   guaiFENesin 100 MG/5ML liquid Commonly known as:  ROBITUSSIN   hydrochlorothiazide 25 MG tablet Commonly known as:  HYDRODIURIL   meloxicam 7.5 MG tablet Commonly  known as:  MOBIC     TAKE these medications   acetaminophen 500 MG tablet Commonly known as:  TYLENOL Take 1,000 mg by mouth every 6 (six) hours as needed for mild pain.   albuterol (2.5 MG/3ML) 0.083% nebulizer solution Commonly known as:  PROVENTIL Take 3 mLs (2.5 mg total) by nebulization every 2 (two) hours as needed for wheezing or shortness of breath.   albuterol 108 (90 Base) MCG/ACT inhaler Commonly known as:  PROVENTIL HFA;VENTOLIN HFA Inhale 2 puffs into the lungs every 6 (six) hours as needed for wheezing or shortness of breath.   azithromycin 250 MG tablet Commonly known as:  ZITHROMAX Take 1 tablet (250 mg total) by mouth daily.   nicotine 14 mg/24hr patch Commonly known as:  NICODERM CQ - dosed in mg/24 hours Place 1 patch (14 mg total) onto the skin daily.   predniSONE 20 MG tablet Commonly known as:  DELTASONE Take 2 tablets (40 mg total) by mouth daily with breakfast.   tiotropium 18 MCG inhalation capsule Commonly known as:  SPIRIVA Place 1 capsule (18 mcg total) into inhaler and inhale daily.            Discharge Care Instructions        Start     Ordered   12/24/16 0000  azithromycin (ZITHROMAX) 250 MG tablet  Daily     12/23/16 1339   12/24/16 0000  nicotine (NICODERM CQ - DOSED IN MG/24 HOURS) 14 mg/24hr patch  Daily     12/23/16 1339   12/24/16 0000  predniSONE (DELTASONE) 20 MG tablet  Daily with breakfast     12/23/16 1339   12/23/16 0000  albuterol (PROVENTIL) (2.5 MG/3ML) 0.083% nebulizer solution  Every 2 hours PRN     12/23/16 1339   12/23/16 0000  tiotropium (SPIRIVA) 18 MCG inhalation capsule  Daily     12/23/16 1339   12/23/16 0000  Increase activity slowly     12/23/16 1339   12/23/16 0000  Diet - low sodium heart healthy     12/23/16 1339   12/23/16 0000  Discharge instructions    Comments:  Follow up at the family medicine clinic as listed on your appointments. Use your albuterol inhaler every 4 hours through Sunday to  continue to open up your lungs.   12/23/16 1339   12/23/16 0000  albuterol (PROVENTIL HFA;VENTOLIN HFA) 108 (90 Base) MCG/ACT inhaler  Every 6 hours PRN     09 /13/18 1339      Discharge Instructions: Please refer to Patient Instructions section of EMR for full details.  Patient was counseled important signs and symptoms that should prompt return to medical care, changes in medications, dietary instructions, activity restrictions, and follow up appointments.   Follow-Up Appointments:   Nuala Alpha, DO 12/22/2016, 12:16 PM PGY-1, [place]

## 2016-12-22 NOTE — ED Notes (Signed)
Sitting up on stretcher no complaints at present. Aware she is waiting on inpatient bed.

## 2016-12-22 NOTE — ED Notes (Signed)
States she is feeling better , starting to cough a little more after treatment

## 2016-12-23 ENCOUNTER — Telehealth: Payer: Self-pay | Admitting: *Deleted

## 2016-12-23 DIAGNOSIS — F17200 Nicotine dependence, unspecified, uncomplicated: Secondary | ICD-10-CM

## 2016-12-23 DIAGNOSIS — F1010 Alcohol abuse, uncomplicated: Secondary | ICD-10-CM | POA: Diagnosis not present

## 2016-12-23 DIAGNOSIS — R0602 Shortness of breath: Secondary | ICD-10-CM | POA: Diagnosis not present

## 2016-12-23 MED ORDER — AZITHROMYCIN 250 MG PO TABS: 250.0000 mg | ORAL_TABLET | Freq: Every day | ORAL | 0 refills | Status: AC

## 2016-12-23 MED ORDER — IPRATROPIUM-ALBUTEROL 0.5-2.5 (3) MG/3ML IN SOLN
3.0000 mL | Freq: Three times a day (TID) | RESPIRATORY_TRACT | Status: DC
  Administered 2016-12-23: 3 mL via RESPIRATORY_TRACT
  Filled 2016-12-23: qty 3

## 2016-12-23 MED ORDER — NICOTINE 14 MG/24HR TD PT24: 14.0000 mg | MEDICATED_PATCH | Freq: Every day | TRANSDERMAL | 0 refills | Status: DC

## 2016-12-23 MED ORDER — PREDNISONE 20 MG PO TABS: 40.0000 mg | ORAL_TABLET | Freq: Every day | ORAL | 0 refills | Status: DC

## 2016-12-23 MED ORDER — ALBUTEROL SULFATE (2.5 MG/3ML) 0.083% IN NEBU: 2.5000 mg | INHALATION_SOLUTION | RESPIRATORY_TRACT | 12 refills | Status: DC | PRN

## 2016-12-23 MED ORDER — ALBUTEROL SULFATE HFA 108 (90 BASE) MCG/ACT IN AERS: 2.0000 | INHALATION_SPRAY | Freq: Four times a day (QID) | RESPIRATORY_TRACT | 2 refills | Status: DC | PRN

## 2016-12-23 MED ORDER — TIOTROPIUM BROMIDE MONOHYDRATE 18 MCG IN CAPS: 18.0000 ug | ORAL_CAPSULE | Freq: Every day | RESPIRATORY_TRACT | 12 refills | Status: DC

## 2016-12-23 NOTE — Progress Notes (Signed)
Nsg Discharge Note  Admit Date:  12/21/2016 Discharge date: 12/23/2016   Clarene Critchley to be D/C'd Home per MD order.  AVS completed.  Copy for chart, and copy for patient signed, and dated. Patient/caregiver able to verbalize understanding.  Discharge Medication: Allergies as of 12/23/2016   No Known Allergies     Medication List    STOP taking these medications   guaiFENesin 100 MG/5ML liquid Commonly known as:  ROBITUSSIN   hydrochlorothiazide 25 MG tablet Commonly known as:  HYDRODIURIL   meloxicam 7.5 MG tablet Commonly known as:  MOBIC     TAKE these medications   acetaminophen 500 MG tablet Commonly known as:  TYLENOL Take 1,000 mg by mouth every 6 (six) hours as needed for mild pain.   albuterol (2.5 MG/3ML) 0.083% nebulizer solution Commonly known as:  PROVENTIL Take 3 mLs (2.5 mg total) by nebulization every 2 (two) hours as needed for wheezing or shortness of breath.   albuterol 108 (90 Base) MCG/ACT inhaler Commonly known as:  PROVENTIL HFA;VENTOLIN HFA Inhale 2 puffs into the lungs every 6 (six) hours as needed for wheezing or shortness of breath.   azithromycin 250 MG tablet Commonly known as:  ZITHROMAX Take 1 tablet (250 mg total) by mouth daily.   nicotine 14 mg/24hr patch Commonly known as:  NICODERM CQ - dosed in mg/24 hours Place 1 patch (14 mg total) onto the skin daily.   predniSONE 20 MG tablet Commonly known as:  DELTASONE Take 2 tablets (40 mg total) by mouth daily with breakfast.   tiotropium 18 MCG inhalation capsule Commonly known as:  SPIRIVA Place 1 capsule (18 mcg total) into inhaler and inhale daily.            Durable Medical Equipment        Start     Ordered   12/23/16 1645  For home use only DME Nebulizer machine  Once    Question:  Patient needs a nebulizer to treat with the following condition  Answer:  COPD (chronic obstructive pulmonary disease) ([place])   12/23/16 1646       Discharge Care Instructions        Start     Ordered   12/24/16 0000  azithromycin (ZITHROMAX) 250 MG tablet  Daily     12/23/16 1339   12/24/16 0000  nicotine (NICODERM CQ - DOSED IN MG/24 HOURS) 14 mg/24hr patch  Daily     12/23/16 1339   12/24/16 0000  predniSONE (DELTASONE) 20 MG tablet  Daily with breakfast     12/23/16 1339   12/23/16 0000  albuterol (PROVENTIL) (2.5 MG/3ML) 0.083% nebulizer solution  Every 2 hours PRN     12/23/16 1339   12/23/16 0000  tiotropium (SPIRIVA) 18 MCG inhalation capsule  Daily     12/23/16 1339   12/23/16 0000  Increase activity slowly     12/23/16 1339   12/23/16 0000  Diet - low sodium heart healthy     12/23/16 1339   12/23/16 0000  Discharge instructions    Comments:  Follow up at the family medicine clinic as listed on your appointments. Use your albuterol inhaler every 4 hours through Sunday to continue to open up your lungs.   12/23/16 1339   12/23/16 0000  albuterol (PROVENTIL HFA;VENTOLIN HFA) 108 (90 Base) MCG/ACT inhaler  Every 6 hours PRN     09 /13/18 1339      Discharge Assessment: Vitals:   12/23/16 0935 12/23/16 1305  BP: 119/77 127/69  Pulse: 89 (!) 107  Resp:  18  Temp:  98.2 F (36.8 C)  SpO2:  93%   Skin clean, dry and intact without evidence of skin break down, no evidence of skin tears noted. IV catheter discontinued intact. Site without signs and symptoms of complications - no redness or edema noted at insertion site, patient denies c/o pain - only slight tenderness at site.  Dressing with slight pressure applied.  D/c Instructions-Education: Discharge instructions given to patient/family with verbalized understanding. D/c education completed with patient/family including follow up instructions, medication list, d/c activities limitations if indicated, with other d/c instructions as indicated by MD - patient able to verbalize understanding, all questions fully answered. Patient instructed to return to ED, call 911, or call MD for any changes in  condition.  Patient escorted via [place], and D/C home via private auto.  Alexio Sroka Margaretha Sheffield, RN 12/23/2016 6:50 PM

## 2016-12-23 NOTE — Progress Notes (Signed)
Family Medicine Teaching Service Daily Progress Note Intern Pager: [phone]  Patient name: [name] Medical record number: [id number] Date of birth: 02/12/1961 Age: 56 y.o. Gender: female  Primary Care Provider: Eloise Levels, MD Consultants: none Code Status: FULL  Pt Overview and Major Events to Date:  [name] is a 56 y.o. female presenting with shortness of breath . PMH is significant for hypertension, tobacco use disorder, alcohol use and osteoarthritis.  9/11 - CXR and CT Angio Chest  Assessment and Plan:  Shortness of breath/cough: likely due to exacerbation of underlying undiagnosed COPD in the setting of viral URI. Patient has had bronchitis in the past so could be URI developing into viral bronchitis. Patient is feeling better and states the breathing treatments have helped. She is a chronic smoker for about 40 years. History and exam not consistent with CHF. PE ruled out by CTA. CXR without focal infiltration. EKG with tachycardia and some T-wave changes in inferior and anterior leads but patient without chest pain and significant cardiac history for ACS. HEART score 3 (hypertension, age and T-wave changes). Ambulated this afternoon with SpO2 >94% with no tachypnea noted. Currently on RA with sats in 90s since 9/13 am. -Status post Decadron 10 mg, CATx1 and DuoNeb x 1 in ED -Start Prednisone 40mg  daily x 5 days ending on 9/16 -Duonebs TID -Albuterol nebulizer every 2 hours as needed  -Discharge her on home med of SABA+anticholinergic (albuterol + tiotropium) -Z-Pak started on 9/11 to end on 9/15 for total of 5 days -Mucinex -Will likely need PFT as an outpatient -Oxygen to keep O2 sat greater than 92%  Emesis/diarrhea/abdominal pain: Resolved. Likely due to viral infection. No further emesis. One episode of lose stool/diarrhea today. Abdominal exam within normal limits. -Continue to monitor  Tachycardia: Likely due to dyspnea and albuterol. She is not  anemic. Last TSH on her years ago was normal limits. Doesn't seem to be withdrawing from alcohol. -Continue to monitor vitals on floor -TSH normal 0.485  Hypertension: controlled Hypertensive to 179/95 on arrival, now normotensive without intervention.  -Continue hydrochlorothiazide daily. -Continue to monitor  Tobacco use disorder: Currently smoker. Half a pack a day. About 20-pack-year history. Interested in quitting but not interested in nicotine patch -Provided counseling -nicotine patch 14mg  -will need extensive support and counseling outpatient. Consider starting Buproprion outpatient.  Alcohol use: reports drinking about 4 of 16 ounce beers a day. Last drink 2 days ago. No history of withdrawal. CIWA scores 0. 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 Full range of motion in the shoulders and next. -Tylenol as needed for pain.  FEN/GI: Heart Healthy diet PPx: Lovenox  Disposition: inpatient, possible discharge tomorrow pending clinical improvement  Subjective:  Patient states she feels good this afternoon. She denies SOB, chest pain, nausea/vomiting/diarrhea/constipation. She does report continued cough with non-productive whitish phelgm and no blood.   Objective: Temp:  [98.2 F (36.8 C)-99 F (37.2 C)] 98.2 F (36.8 C) (09/13 0354) Pulse Rate:  [86-104] 89 (09/13 0935) Resp:  [18-20] 20 (09/12 1623) BP: (117-129)/(59-77) 119/77 (09/13 0935) SpO2:  [90 %-98 %] 97 % (09/13 0828) FiO2 (%):  [21 %] 21 % (09/13 0828) Weight:  [119 lb 9.6 oz (54.3 kg)] 119 lb 9.6 oz (54.3 kg) (09/12 1623) Physical Exam:  Gen: Alert and Oriented x 3, NAD Resp: CTA bilaterally, inspiratory and expiratory wheezing throughout bilaterally. No crackles or rhonchi. Decreased air movement in LLL. No increased WOB or retractions  noted. CV: RRR, no murmurs/rubs/gallops.  Ext: WWP, no LE edema  Laboratory:  Recent Labs Lab 12/21/16 1146  12/22/16 0230  WBC 11.3* 10.4  HGB 13.0 11.2*  HCT 39.2 33.6*  PLT 320 269    Recent Labs Lab 12/21/16 1146 12/22/16 0230  NA 137  --   K 4.0  --   CL 100*  --   CO2 28  --   BUN 7  --   CREATININE 0.73 0.80  CALCIUM 9.8  --   GLUCOSE 142*  --    9/11 - U/A: negative for nitrites or leukocytes 9/11 - Troponin: <0.00 x 2 9/11 - beta hCG: <5.0 9/11 - Lacitc Acid: 1.04 9/11 - ABG: pH 7.449, pCO2 32.8, Bicarb 22.8 9/11 - serum Cr 0.80 9/11 - TSH: 0.485  Imaging/Diagnostic Tests: 9/11 - CXR: IMPRESSION: No acute cardiopulmonary disease. 9/11 - EKG: NSR, Normal axis, non-specific T-wave abnormalities, left atrial enlargement 9/11 - CT Angio Chest: 1. No evidence for acute pulmonary embolus. 2. Bilateral multifocal patchy areas of ground-glass attenuation compatible with inflammatory or infectious pneumonitis. 3.  Aortic Atherosclerosis   Rory Percy, DO 12/23/2016, 12:48 PM PGY-1, [place] Intern pager: [phone], text pages welcome

## 2016-12-23 NOTE — Progress Notes (Addendum)
SATURATION QUALIFICATIONS: (This note is used to comply with regulatory documentation for home oxygen)  Patient Saturations on Room Air at Rest = 93%  Patient Saturations on Room Air while Ambulating = 93%  Patient Saturations on 0 Liters of oxygen while Ambulating = 94%  Please briefly explain why patient needs home oxygen: Not needed

## 2016-12-23 NOTE — Telephone Encounter (Signed)
Received fax from [facility] requesting to change Proventil HFA inhaler to ProAir. Proventil is not covered by patient's insurance.  Derl Barrow, RN

## 2016-12-23 NOTE — Progress Notes (Signed)
Rt attempted to call MD for clarification on home neb order x 2 attempts with no call back. Pt does not use nebs at home but does use MDI PRN. RT attempted to call RN to let her know of no return call from MD but RN was unavailable. RT will continue to monitor.

## 2016-12-24 ENCOUNTER — Other Ambulatory Visit: Payer: Self-pay | Admitting: Family Medicine

## 2016-12-24 MED ORDER — ALBUTEROL SULFATE HFA 108 (90 BASE) MCG/ACT IN AERS: 1.0000 | INHALATION_SPRAY | Freq: Four times a day (QID) | RESPIRATORY_TRACT | 3 refills | Status: DC | PRN

## 2016-12-24 NOTE — Telephone Encounter (Signed)
I don't see this on the patients medication list.   Quillian Quince L. Rosalyn Gess, [place] Medicine Resident PGY-2 12/24/2016 10:23 AM

## 2016-12-24 NOTE — Telephone Encounter (Signed)
Ordered proair.   Thanks, Cherly Anderson. Rosalyn Gess, [place] Resident PGY-2 12/24/2016 10:26 AM

## 2016-12-27 NOTE — Telephone Encounter (Addendum)
Looking at hospital discharge notes to pt, HCTC was DC'd upon discharge. Pt said she will talk to Dr. Rosalyn Gess tomorrow to make sure this is correct.  Pt informed. Ottis Stain, CMA

## 2016-12-28 ENCOUNTER — Encounter: Payer: Self-pay | Admitting: Family Medicine

## 2016-12-28 ENCOUNTER — Ambulatory Visit (INDEPENDENT_AMBULATORY_CARE_PROVIDER_SITE_OTHER): Payer: BC Managed Care – PPO | Admitting: Family Medicine

## 2016-12-28 VITALS — BP 120/74 | HR 86 | Temp 98.2°F | Ht 67.0 in | Wt 126.0 lb

## 2016-12-28 DIAGNOSIS — Z1211 Encounter for screening for malignant neoplasm of colon: Secondary | ICD-10-CM | POA: Diagnosis not present

## 2016-12-28 DIAGNOSIS — Z09 Encounter for follow-up examination after completed treatment for conditions other than malignant neoplasm: Secondary | ICD-10-CM

## 2016-12-28 MED ORDER — DTAP-IPV VACCINE IM SUSP: 0.5000 mL | Freq: Once | INTRAMUSCULAR | 0 refills | Status: AC

## 2016-12-28 NOTE — Patient Instructions (Addendum)
[name], you were seen today for a hospital follow-up after being admitted for shortness of breath. I'm glad to hear that your symptoms are improving. Continue the good work with quitting smoking.  On your lung exam, I did not hear any crackles or wheezing, but your lung volumes did sound a bit smaller than normal.  I want you to make an appointment to see our pharmacist, Dr. Valentina Lucks to have pulmonary function tests (PFTs) performed.   I also put in a referral for a colonoscopy and provided with paperwork to call and make an appointment to get a mammogram.  I also highly recommend that you go to the pharmacy to get a flu shot and I also provided you with a prescription to get a tetanus shot.   Continue with your Spiriva daily as well as the Mucinex if you feel congested.  It was very nice seeing you today, I would like you to follow up in 2 months and we see how you're smoking doing and have a check up.  [name] L. Rosalyn Gess, [place] [facility] Resident PGY-2 12/28/2016 2:31 PM

## 2016-12-28 NOTE — Progress Notes (Signed)
    Subjective:   [name] presents for hospital follow up. Patient was hospitalized from 12/21/16 to 12/23/16 with SOB.   Patient now reports improved symptoms.  She does have an ongoing cough that is sometimes productive that is white and is continuing to take mucinex.  This is not worsening and appears to be about the same from when she was in the hospital.  She has been spiriva every day in the morning and has not had one cigarette since discharge.  She has been wearing a nicotine patch and feels that it is working well.  Has only had to use her albuterol once.  Has completed course of abx and steroids.    ROS: denies fevers, chills, SOB, CP, swelling, NV or diarrhea.   PMH: tobacco use Tobacco use: former smoker  Medication: reviewed and updated ROS: see HPI   Objective:  Physical Exam: BP 120/74   Pulse 86   Temp 98.2 F (36.8 C) (Oral)   Ht 5\' 7"  (1.702 m)   Wt 126 lb (57.2 kg)   LMP 04/12/2004   SpO2 99%   BMI 19.73 kg/m   Gen: 56 year old female in NAD, resting comfortably CV: RRR with no murmurs appreciated Pulm: NWOB and CTABL with decreased air movement throughout and no wheezing or rhonchi GI: Normal bowel sounds present. Soft, Nontender, Nondistended. MSK: no edema, cyanosis, or clubbing noted Skin: warm, dry Neuro: grossly normal, moves all extremities Psych: Normal affect and thought content  No results found for this or any previous visit (from the past 72 hour(s)).   Assessment/Plan:  Hospitalization follow-up Patient presents today for a hospital follow-up for shortness of breath. She reports resolution of her shortness of breath, but does report continued cough with white sputum. She has not had any fevers, chills or chest pain, her lungs were clear and she was satting 95% on room air, making pneumonia unlikely. She continues to use her Spiriva daily and albuterol as needed. I have recommended that continue with these and that she make an appointment  to have a pulmonary function test. Additionally she has not smoked in 1 week and is doing great job. I have encouraged her to continue with smoking cessation and we will follow up in 2 months to see how this is going and maybe this will give her enough time to get PFTs and determine if she has COPD.   Health maintenance Provided patient with prescription for tetanus shot. Her flu shot today and recommended that she go to the pharmacy and she could get both a tetanus shot and a flu shot and same time. Also placed referral for colonoscopy. She is also due for mammogram and I recommended that she call the breast center to make an appointment.  Sherial Ebrahim L. Rosalyn Gess, [place] Resident PGY-2 12/28/2016 3:44 PM

## 2017-01-27 ENCOUNTER — Encounter: Payer: Self-pay | Admitting: Family Medicine

## 2017-02-28 ENCOUNTER — Encounter: Payer: Self-pay | Admitting: Pharmacist

## 2017-02-28 ENCOUNTER — Ambulatory Visit: Payer: BC Managed Care – PPO | Admitting: Pharmacist

## 2017-02-28 ENCOUNTER — Other Ambulatory Visit: Payer: Self-pay | Admitting: Family Medicine

## 2017-02-28 DIAGNOSIS — R0602 Shortness of breath: Secondary | ICD-10-CM

## 2017-02-28 DIAGNOSIS — I10 Essential (primary) hypertension: Secondary | ICD-10-CM

## 2017-02-28 DIAGNOSIS — Z1231 Encounter for screening mammogram for malignant neoplasm of breast: Secondary | ICD-10-CM

## 2017-02-28 DIAGNOSIS — Z720 Tobacco use: Secondary | ICD-10-CM | POA: Diagnosis not present

## 2017-02-28 NOTE — Patient Instructions (Addendum)
Thanks for coming in today.   Congratulations on quitting smoking!  Keep up the great work on quitting smoking.   1. We recommend pulmonary rehab to retrain your lungs - try walking or going up/down flights of stairs to exercise your lungs.  2. Keep your albuterol with you in case you need it on a bad day.  3. You do not need to continue using your Spiriva inhaler, however, you can go back on it at any time if you feel like it is helping.  Come back to see Dr. Rosalyn Gess in 1 month or 6 weeks.

## 2017-02-28 NOTE — Assessment & Plan Note (Signed)
#  PFTs Spirometry evaluation with Pre and Post Bronchodilator reveals mildly reduced lung function with a lung age of 52. Patient quit smoking 2 months ago, after smoking for 39 years of smoking ~ 0.25 ppd.. Patient's reduced lung function is likely due to her small body frame and tobacco history. Her SOB has improved since hospital discharge, as patient has not had to use her albuterol inhaler at all. Continued her albuterol inhaler as needed and changed her Spiriva from daily to as needed due to cost.  Educated patient on purpose and proper inhaler technique of albuterol. Reviewed results of pulmonary function tests. Pt verbalized understanding of results and education.

## 2017-02-28 NOTE — Progress Notes (Signed)
   S:    Patient arrives in good spirits, ambulating without assistance. Presents for lung function evaluation. Patient was referred on 12/28/16 by Dr. Rosalyn Gess. Patient was last seen by Primary Care Provider on 12/28/16.  Patient reports breathing has been better since she started Spiriva and quit smoking.   Patient reports last dose of COPD medications was last Friday (02/25/17) due to price of Spiriva (~$42 for this patient). Patient has not had to use her albuterol inhaler since her hospital discharge (12/23/16).  Patient was hospitalized 12/21/16-12/23/16 with SOB and reports her last cigarette was 12/20/16. Patient reports starting smoking at age 39 and smoked an average of 6 cigarettes/day of basic menthol lights. She was able to quit while she was pregnant and then again for a total of 1 year; she went back to smoking due to stress. Patient is confident she will not begin smoking again after her hospitalization. Patient has support for her smoking cessation from her son.  Patient reports her coughing and amount of phlegm has improved significantly since quitting smoking. She also reports her sense of smell and taste has gotten much better as well.  Patient reports her weight increasing by ~10 lbs since her last visit (current wt: 134 lbs), however, her goal weight is 140 lbs or less so she is minimally concerned.  Family History: Uncle on oxygen due to smoking - Patient stated she does not want to end up being oxygen dependent.  O: mMRC score= 0 CAT score= 7 See Documentation Flowsheet - CAT/COPD for complete symptom scoring.  See "scanned report" or Documentation Flowsheet (discrete results - PFTs) for  Spirometry results. Patient provided good effort while attempting spirometry.   Lung Age = 76 Albuterol Neb  Lot# [id number]     Exp. 08/2018  A/P: #Smoking Cessation Longstanding tobacco use, recently controlled as patient has not smoked a single cigarette since 12/20/16. Patient reported  using the Nicoderm CQ 14mg /24hr patches for 1 month after her hospitalization, but has not needed any pharmacotherapy assistance with smoking cessation since mid-October. Patient is confident that she will not re-start tobacco use, however, patient was counseled on potential triggers (stress) that could arise, and she has a plan of what to do if the urge to smoke returns. Patient appears highly motivated and is supported by her son.   #PFTs Spirometry evaluation with Pre and Post Bronchodilator reveals mildly reduced lung function with a lung age of 40. Patient quit smoking 2 months ago, after smoking for 39 years of smoking ~ 0.25 ppd.. Patient's reduced lung function is likely due to her small body frame and tobacco history. Her SOB has improved since hospital discharge, as patient has not had to use her albuterol inhaler at all. Continued her albuterol inhaler as needed and changed her Spiriva from daily to as needed due to cost.  Educated patient on purpose and proper inhaler technique of albuterol. Reviewed results of pulmonary function tests. Pt verbalized understanding of results and education.  #Elevated BP Patient with elevated in-office BP measurements of 152/77 and 152/75 when repeated. Consider reevaluating the use of hypertension medications at next visit if BP is still elevated. Patient is not currently on any BP medications. Previous BP medications tried in the past include: hctz 25 mg daily and lisinopril-hctz 20-15 mg once daily.  Written pt instructions provided.  F/U with Dr. Rosalyn Gess in 1 month or 6 weeks. Total time in face to face counseling 40 minutes.  Patient seen with Drusilla Kanner, PharmD Candidate.

## 2017-02-28 NOTE — Progress Notes (Signed)
Patient ID: [name], female   DOB: 19-Mar-1961, 56 y.o.   MRN: [id number] Reviewed: Agree with Dr. Graylin Shiver documentation and management.

## 2017-02-28 NOTE — Assessment & Plan Note (Signed)
#  Elevated BP Patient with elevated in-office BP measurements of 152/77 and 152/75 when repeated. Consider reevaluating the use of hypertension medications at next visit if BP is still elevated. Patient is not currently on any BP medications. Previous BP medications tried in the past include: hctz 25 mg daily and lisinopril-hctz 20-15 mg once daily.

## 2017-02-28 NOTE — Assessment & Plan Note (Signed)
#  Smoking Cessation Longstanding tobacco use, recently controlled as patient has not smoked a single cigarette since 12/20/16. Patient reported using the Nicoderm CQ 14mg /24hr patches for 1 month after her hospitalization, but has not needed any pharmacotherapy assistance with smoking cessation since mid-October. Patient is confident that she will not re-start tobacco use, however, patient was counseled on potential triggers (stress) that could arise, and she has a plan of what to do if the urge to smoke returns. Patient appears highly motivated and is supported by her son.

## 2017-03-01 ENCOUNTER — Ambulatory Visit
Admission: RE | Admit: 2017-03-01 | Discharge: 2017-03-01 | Disposition: A | Discharge status: Home / Self Care | Payer: BC Managed Care – PPO | Source: Ambulatory Visit | Attending: Family Medicine | Admitting: Family Medicine

## 2017-03-01 DIAGNOSIS — Z1231 Encounter for screening mammogram for malignant neoplasm of breast: Secondary | ICD-10-CM

## 2017-03-23 ENCOUNTER — Encounter: Payer: Self-pay | Admitting: Family Medicine

## 2017-03-25 ENCOUNTER — Ambulatory Visit (INDEPENDENT_AMBULATORY_CARE_PROVIDER_SITE_OTHER): Payer: BC Managed Care – PPO | Admitting: Family Medicine

## 2017-03-25 ENCOUNTER — Encounter: Payer: Self-pay | Admitting: Family Medicine

## 2017-03-25 ENCOUNTER — Other Ambulatory Visit: Payer: Self-pay

## 2017-03-25 VITALS — BP 126/86 | HR 79 | Temp 98.3°F | Ht 66.5 in | Wt 140.0 lb

## 2017-03-25 DIAGNOSIS — M25551 Pain in right hip: Secondary | ICD-10-CM | POA: Insufficient documentation

## 2017-03-25 DIAGNOSIS — M79641 Pain in right hand: Secondary | ICD-10-CM

## 2017-03-25 DIAGNOSIS — Z23 Encounter for immunization: Secondary | ICD-10-CM

## 2017-03-25 DIAGNOSIS — M79642 Pain in left hand: Secondary | ICD-10-CM

## 2017-03-25 NOTE — Progress Notes (Signed)
Subjective:  [name] is a 56 y.o. 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  She is waking up over the past few months with morning stiffness and pain throughout her hands.  She denies any redness, swelling, fever, chills, nausea, vomiting or diarrhea.  She has no known history of arthritis or family history of rheumatoid arthritis.  She denies any trauma.  She denies any numbness or tingling in her upper extremities or hands.   Right hip pain Reports pain on her right lateral hip over the past week.  Pain is worse with lying on her side and lifting leg to the side.  She denies any numbness or tingling in her lower extremities.  She denies any trauma.  She denies any fevers, chills, nausea, vomiting or diarrhea.  She is never had anything like this in the past.  She denies any apparent weakness.  PMH: Hypertension Tobacco use: Former smoker  Medication: reviewed and updated ROS: see HPI   Objective:  Physical Exam: BP 126/86   Pulse 79   Temp 98.3 F (36.8 C) (Oral)   Ht 5' 6.5" (1.689 m)   Wt 140 lb (63.5 kg)   LMP 04/12/2004   SpO2 99%   BMI 22.26 kg/m   Gen: 56yo F in NAD, resting comfortably CV: RRR with no murmurs appreciated Pulm: NWOB, CTAB with no crackles, wheezes, or rhonchi GI: Normal bowel sounds present. Soft, Nontender, Nondistended. MSK: Bilateral hands with tenderness at the [facility] joint, PIPs DIPs and MCPs.  She has no redness, swelling.  She has full active and passive range of motion without tenderness.  Right hip tender to palpation at the area of the greater trochanter.  Pain with palpation on the IT band on the right side.  No decreased range of motion in the right lower extremity.  Skin: warm, dry Neuro: Grip strength 4+ out of 5 secondary to pain.  No changes in sensation in the hands bilaterally.   Strength 4 out of 5 with hip abduction on the right side.  No change in sensation on the bilateral lower extremities.  Psych: Normal affect and thought content  No results found for this or any previous visit (from the past 72 hour(s)).   Assessment/Plan:  Bilateral hand pain No red flag signs or symptoms.  Does appear to have some focal tenderness at the [facility] joint.  No known family history of rheumatoid arthritis.  No other evidence of osteoarthritis elsewhere.  We will get two-view x-rays of the hands bilaterally.  Recommended ibuprofen as needed and follow-up in 4 weeks.  Will call patient with x-ray results.   Right hip pain Focal tenderness to palpation of the area of the greater trochanter.  Also has tenderness to palpation along the IT band and 4/5 weakness with hip abduction on the right side.  This could represent trochanteric bursitis.  Have recommended the patient try exercises provided and use ibuprofen as needed and to avoid sleeping on right side and follow-up in 4 weeks.  Can consider corticosteroid injection at that time if symptoms persist.   Health maintenance Patient received tetanus and flu shot today.  She is still thinking about colonoscopy but we did discuss this.   [name] L. Rosalyn Gess, [place] Resident PGY-2 03/25/2017 4:12 PM

## 2017-03-25 NOTE — Assessment & Plan Note (Signed)
Focal tenderness to palpation of the area of the greater trochanter.  Also has tenderness to palpation along the IT band and 4/5 weakness with hip abduction on the right side.  This could represent trochanteric bursitis.  Have recommended the patient try exercises provided and use ibuprofen as needed and to avoid sleeping on right side and follow-up in 4 weeks.  Can consider corticosteroid injection at that time if symptoms persist.

## 2017-03-25 NOTE — Assessment & Plan Note (Signed)
No red flag signs or symptoms.  Does appear to have some focal tenderness at the [facility] joint.  No known family history of rheumatoid arthritis.  No other evidence of osteoarthritis elsewhere.  We will get two-view x-rays of the hands bilaterally.  Recommended ibuprofen as needed and follow-up in 4 weeks.  Will call patient with x-ray results.

## 2017-03-25 NOTE — Patient Instructions (Signed)
[name], congratulations on quitting smoking!  I am unsure about your hand pain at this time I want to get x-rays to make sure that everything looks okay.  I would recommend you take ibuprofen as needed for pain and follow-up with me in 3-4 weeks.  I will call you about the x-rays if there is any abnormalities.  For your right hip pain this could be a trochanteric bursitis.  I am recommending that you take ibuprofen as needed and I have also given you some exercises to strengthen your hip muscles.  We can follow-up on this in the next 4 weeks if you continue to have pain at that time we could consider doing a steroid injection.   Joellen Tullos L. Rosalyn Gess, [place] [facility] Resident PGY-2 03/25/2017 3:09 PM

## 2017-03-28 ENCOUNTER — Ambulatory Visit
Admission: RE | Admit: 2017-03-28 | Discharge: 2017-03-28 | Disposition: A | Discharge status: Home / Self Care | Payer: BC Managed Care – PPO | Source: Ambulatory Visit | Attending: Family Medicine | Admitting: Family Medicine

## 2017-03-28 DIAGNOSIS — M79641 Pain in right hand: Secondary | ICD-10-CM

## 2017-03-28 DIAGNOSIS — M79642 Pain in left hand: Principal | ICD-10-CM

## 2017-03-29 ENCOUNTER — Other Ambulatory Visit: Payer: Self-pay | Admitting: Family Medicine

## 2017-03-29 MED ORDER — IBUPROFEN 600 MG PO TABS: 600.0000 mg | ORAL_TABLET | Freq: Three times a day (TID) | ORAL | 0 refills | Status: DC | PRN

## 2017-04-01 ENCOUNTER — Ambulatory Visit: Payer: BC Managed Care – PPO | Admitting: Family Medicine

## 2017-06-15 ENCOUNTER — Encounter: Payer: Self-pay | Admitting: Family Medicine

## 2017-06-15 ENCOUNTER — Ambulatory Visit: Payer: BC Managed Care – PPO | Admitting: Family Medicine

## 2017-06-15 ENCOUNTER — Other Ambulatory Visit: Payer: Self-pay

## 2017-06-15 VITALS — BP 157/90 | HR 81 | Temp 98.3°F | Ht 66.5 in | Wt 151.0 lb

## 2017-06-15 DIAGNOSIS — R5383 Other fatigue: Secondary | ICD-10-CM | POA: Diagnosis not present

## 2017-06-15 DIAGNOSIS — M79641 Pain in right hand: Secondary | ICD-10-CM | POA: Diagnosis not present

## 2017-06-15 DIAGNOSIS — I10 Essential (primary) hypertension: Secondary | ICD-10-CM | POA: Diagnosis not present

## 2017-06-15 DIAGNOSIS — Z87891 Personal history of nicotine dependence: Secondary | ICD-10-CM

## 2017-06-15 DIAGNOSIS — R0602 Shortness of breath: Secondary | ICD-10-CM

## 2017-06-15 DIAGNOSIS — M79642 Pain in left hand: Secondary | ICD-10-CM

## 2017-06-15 MED ORDER — ALBUTEROL SULFATE HFA 108 (90 BASE) MCG/ACT IN AERS: 1.0000 | INHALATION_SPRAY | Freq: Four times a day (QID) | RESPIRATORY_TRACT | 3 refills | Status: DC | PRN

## 2017-06-15 MED ORDER — IBUPROFEN 600 MG PO TABS: 600.0000 mg | ORAL_TABLET | Freq: Three times a day (TID) | ORAL | 0 refills | Status: DC | PRN

## 2017-06-15 MED ORDER — HYDROCHLOROTHIAZIDE 12.5 MG PO TABS: 12.5000 mg | ORAL_TABLET | Freq: Every day | ORAL | 0 refills | Status: DC

## 2017-06-15 NOTE — Assessment & Plan Note (Signed)
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  - Ibuprofen 600 mg q 8 PRN - Discussed Bengay or ice hot application

## 2017-06-15 NOTE — Patient Instructions (Signed)
Thank you for coming in today, it was so nice to see you! Today we talked about:    Shortness of breath: Your lungs sound clear today.  You may develop some shortness of breath from time to time just from some damage in your lungs from cigarette use.  He can continue using her albuterol as needed.  He find that you are using your albuterol more than 3 times a week then please let us know.  We are checking blood work today to ensure that nothing else is going on   Your hand x-ray results show arthritis, to continue the ibuprofen as needed.  You can take ibuprofen in the middle of your work shift since your pain is worse at the end of the day.  You can also try rubbing BenGay or icy hot on your hands.  You can buy compression stockings to help with some of the foot aching and leg pain from working all day long  Your blood pressure is above goal.  Her goal blood pressure is for the top number to be under 140 and the bottom number to be under 90, this prevents against strokes and heart attacks.  We are restarting hydrochlorothiazide 12.5 mg daily.  I sent this prescription to your pharmacy    Please follow up in 1 month for blood pressure. You can schedule this appointment at the front desk before you leave or call the clinic.  Bring in all your medications or supplements to each appointment for review.   If we ordered any tests today, you will be notified via telephone of any abnormalities. If everything is normal you will get a letter in the mail.   If you have any questions or concerns, please do not hesitate to call the office at [phone]. You can also message me directly via MyChart.   Sincerely,  Smitty Cords, MD

## 2017-06-15 NOTE — Assessment & Plan Note (Signed)
Continues to be intermittently symptomatic, associated with fatigue after working all day. Has only needed albuterol inhaler once in the last week and before that she only needed it in Sept 2018. Lungs are clear today. No chest pain or weight gain. Suspect symptoms from previous prolonged tobacco abuse and also from exertion at work.  - ALbuterol PRN - Return precautions discussed - TSH - CBC

## 2017-06-15 NOTE — Assessment & Plan Note (Addendum)
Uncontrolled. 160/80 initially and 157/90 on recheck. Not on any antihypertensives -Start HCTZ 12.5 mg daily - BMP

## 2017-06-15 NOTE — Progress Notes (Signed)
Subjective:    Patient ID: [name] , female   DOB: Dec 09, 1960 , 57 y.o..   MRN: [id number]  HPI  AMANIE MCCULLEY is here for  Chief Complaint  Patient presents with  . Fatigue and SOB    when working all day  . xray results    1. 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  She notes that she was previously admitted to the hospital for shortness of breath and it was thought that she had COPD.  That was in September 2018.  She had her lungs tested to see if she had COPD and she states that she did not.  She notes that she has a prescription for albuterol and she had to take it last week once when she had shortness of breath, before that the last time she needed it was in September 2018.  She notes that the albuterol and sitting down resting help her shortness of breath.  She denies any wheezing, chest pain, lightheadedness, dizziness, weight gain, leg swelling.  She notes that her fatigue and shortness of breath is worse at the end of the day after working.  She notes that she quit smoking a couple months ago and has not picked up a cigarette since.  2. Hand XRAY results: Patient received hand x-rays previously due to bilateral hand pain but she never got the results.  She is wondering about this.  She does take ibuprofen occasionally which does help her pain.  Review of Systems: Per HPI.  Past Medical History: Patient Active Problem List   Diagnosis Date Noted  . Former heavy tobacco smoker 06/15/2017  . Bilateral hand pain 03/25/2017  . Right hip pain 03/25/2017  . Alcohol use   . Alcohol abuse   . SOB (shortness of breath) 12/21/2016  . Thumb pain, right 09/02/2016  . Hypertension 03/08/2012  . Foot pain 03/08/2012    Medications: reviewed and updated Current Outpatient Medications  Medication Sig Dispense Refill  . acetaminophen (TYLENOL) 500 MG tablet Take 1,000 mg by mouth every 6 (six) hours as needed for mild pain.    Marland Kitchen  albuterol (PROAIR HFA) 108 (90 Base) MCG/ACT inhaler Inhale 1-2 puffs into the lungs every 6 (six) hours as needed for wheezing or shortness of breath. 1 Inhaler 3  . ibuprofen (ADVIL,MOTRIN) 600 MG tablet Take 1 tablet (600 mg total) by mouth every 8 (eight) hours as needed. 60 tablet 0  . hydrochlorothiazide (HYDRODIURIL) 12.5 MG tablet Take 1 tablet (12.5 mg total) by mouth daily. 90 tablet 0  . tiotropium (SPIRIVA) 18 MCG inhalation capsule Place 1 capsule (18 mcg total) into inhaler and inhale daily. (Patient not taking: Reported on 06/15/2017) 30 capsule 12   No current facility-administered medications for this visit.     Social Hx:  reports that she quit smoking about 5 months ago. Her smoking use included cigarettes. She started smoking about 40 years ago. She has a 9.75 pack-year smoking history. she has never used smokeless tobacco.   Objective:   BP (!) 157/90   Pulse 81   Temp 98.3 F (36.8 C) (Oral)   Ht 5' 6.5" (1.689 m)   Wt 151 lb (68.5 kg)   LMP 04/12/2004   SpO2 98%   BMI 24.01 kg/m  Physical Exam  Gen: NAD, alert, cooperative with exam, well-appearing HEENT: NCAT, PERRL, clear conjunctiva, oropharynx clear, supple neck Cardiac: Regular rate and rhythm, normal S1/S2, no  edema, capillary refill brisk  Respiratory: Clear to auscultation bilaterally, no wheezes, non-labored breathing Skin: no rashes, normal turgor  Neurological: no gross deficits.  Psych: good insight, normal mood and affect  Assessment & Plan:  Hypertension Uncontrolled. 160/80 initially and 157/90 on recheck. 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  - Ibuprofen 600 mg q 8 PRN - Discussed Bengay or ice hot application   SOB (shortness of breath) Continues to be  intermittently symptomatic, associated with fatigue after working all day. Has only needed albuterol inhaler once in the last week and before that she only needed it in Sept 2018. Lungs are clear today. No chest pain or weight gain. Suspect symptoms from previous prolonged tobacco abuse and also from exertion at work.  - ALbuterol PRN - Return precautions discussed - TSH - CBC  Orders Placed This Encounter  Procedures  . CBC  . TSH  . Basic Metabolic Panel   Meds ordered this encounter  Medications  . ibuprofen (ADVIL,MOTRIN) 600 MG tablet    Sig: Take 1 tablet (600 mg total) by mouth every 8 (eight) hours as needed.    Dispense:  60 tablet    Refill:  0  . albuterol (PROAIR HFA) 108 (90 Base) MCG/ACT inhaler    Sig: Inhale 1-2 puffs into the lungs every 6 (six) hours as needed for wheezing or shortness of breath.    Dispense:  1 Inhaler    Refill:  3  . hydrochlorothiazide (HYDRODIURIL) 12.5 MG tablet    Sig: Take 1 tablet (12.5 mg total) by mouth daily.    Dispense:  90 tablet    Refill:  0    Smitty Cords, MD [place], PGY-3

## 2017-06-16 LAB — BASIC METABOLIC PANEL
BUN/Creatinine Ratio: 21 (ref 9–23)
BUN: 19 mg/dL (ref 6–24)
CO2: 25 mmol/L (ref 20–29)
CREATININE: 0.89 mg/dL (ref 0.57–1.00)
Calcium: 9.7 mg/dL (ref 8.7–10.2)
Chloride: 106 mmol/L (ref 96–106)
GFR, EST AFRICAN AMERICAN: 84 mL/min/{1.73_m2} (ref >59)
GFR, EST NON AFRICAN AMERICAN: 73 mL/min/{1.73_m2} (ref >59)
Glucose: 88 mg/dL (ref 65–99)
Potassium: 4.7 mmol/L (ref 3.5–5.2)
SODIUM: 146 mmol/L — AB (ref 134–144)

## 2017-06-16 LAB — CBC
HEMATOCRIT: 34.6 % (ref 34.0–46.6)
Hemoglobin: 11 g/dL — ABNORMAL LOW (ref 11.1–15.9)
MCH: 31.4 pg (ref 26.6–33.0)
MCHC: 31.8 g/dL (ref 31.5–35.7)
MCV: 99 fL — ABNORMAL HIGH (ref 79–97)
Platelets: 360 10*3/uL (ref 150–379)
RBC: 3.5 x10E6/uL — ABNORMAL LOW (ref 3.77–5.28)
RDW: 13.5 % (ref 12.3–15.4)
WBC: 5.8 10*3/uL (ref 3.4–10.8)

## 2017-06-16 LAB — TSH: TSH: 0.971 u[IU]/mL (ref 0.450–4.500)

## 2017-06-19 ENCOUNTER — Encounter: Payer: Self-pay | Admitting: Family Medicine

## 2017-07-20 ENCOUNTER — Ambulatory Visit (INDEPENDENT_AMBULATORY_CARE_PROVIDER_SITE_OTHER): Payer: BC Managed Care – PPO | Admitting: Family Medicine

## 2017-07-20 ENCOUNTER — Other Ambulatory Visit: Payer: Self-pay

## 2017-07-20 ENCOUNTER — Encounter: Payer: Self-pay | Admitting: Family Medicine

## 2017-07-20 DIAGNOSIS — Z789 Other specified health status: Secondary | ICD-10-CM | POA: Diagnosis not present

## 2017-07-20 DIAGNOSIS — I10 Essential (primary) hypertension: Secondary | ICD-10-CM | POA: Diagnosis not present

## 2017-07-20 DIAGNOSIS — M79644 Pain in right finger(s): Secondary | ICD-10-CM

## 2017-07-20 DIAGNOSIS — Z7289 Other problems related to lifestyle: Secondary | ICD-10-CM

## 2017-07-20 MED ORDER — NAPROXEN 250 MG PO TABS: ORAL_TABLET | ORAL | 0 refills | Status: DC

## 2017-07-20 NOTE — Progress Notes (Signed)
Subjective:    Patient ID: [name] , female   DOB: [birth year] , 57 y.o..   MRN: [id number]  HPI  [name] is a 57 yo F with PMH of HTN, Foot pain, Hand arthritis, alcohol abuse?, former heavy tobacco user here for  Chief Complaint  Patient presents with  . Follow Up Medication    1. Hypertension Blood pressure at home: Does not check Exercise: does not formally exercise Low salt diet: compliant sometimes Medications: Compliant with HCTZ most of the time. Forgot yesterday  Side effects: None ROS: Denies headache, dizziness, visual changes, nausea, vomiting, chest pain, abdominal pain or shortness of breath. BP Readings from Last 3 Encounters:  07/20/17 138/84  06/15/17 (!) 157/90  03/25/17 126/86   2. Right thumb pain:  DIP joint "falls oput of place" and then comes back in, this only happens at night at the end of a work day. This has been going on for a couple nights. No trauma. The DIP is swollen and sore. It feels warm in the area. HAs never happened before.   Review of Systems: Per HPI.   Past Medical History: Patient Active Problem List   Diagnosis Date Noted  . Former heavy tobacco smoker 06/15/2017  . Bilateral hand pain 03/25/2017  . Right hip pain 03/25/2017  . Alcohol use   . Thumb pain, right 09/02/2016  . Hypertension 03/08/2012  . Foot pain 03/08/2012    Medications: reviewed   Social Hx:  reports that she quit smoking about 7 months ago. Her smoking use included cigarettes. She started smoking about 40 years ago. She has a 9.75 pack-year smoking history. She has never used smokeless tobacco.   Objective:   BP 138/84   Pulse 87   Temp 98.2 F (36.8 C) (Oral)   Ht 5\' 7"  (1.702 m)   Wt 152 lb 9.6 oz (69.2 kg)   LMP 04/12/2004   SpO2 99%   BMI 23.90 kg/m  Physical Exam  Gen: NAD, alert, cooperative with exam, well-appearing Cardiac: Regular rate and rhythm, normal S1/S2, no murmur, no edema, capillary refill brisk  Respiratory:  Clear to auscultation bilaterally, no wheezes, non-labored breathing MSK: Right hand; right first digit appears to have a swollen slightly erythematous DIP joint, full range of motion, tenderness to palpation of DIP joint, normal strength, neurovascularly intact  Assessment & Plan:  Thumb pain, right New DIP joint swelling and pain.  Suspect osteoarthritis versus possible gout.  Would be an atypical location for a gout flare however not impossible given that she was recently started on hydrochlorothiazide and she also drinks about 3 alcoholic beverages a day -We will treat with 1 week of naproxen, see specific dosing below - Do not take any other NSAIDs while on naproxen - Advised decreasing alcohol to no more than 2 drinks a day -Continue hydrochlorothiazide for now for the antihypertensive benefits  Alcohol use Has alcohol use and abuse on problem list.  Does not endorse abusing alcohol however patient endorses drinking 3 light beers a day  -Advised cutting back to no more than 2 alcoholic drinks a day  Hypertension Controlled today.  Hydrochlorothiazide was started on March 6, she has been doing well with this - Follow-up in 3 months   Meds ordered this encounter  Medications  . naproxen (NAPROSYN) 250 MG tablet    Sig: 750 mg on day 1 then 250 mg every 8 hours for 6 days    Dispense:  30 tablet  Refill:  0    Smitty Cords, MD [place], PGY-3

## 2017-07-20 NOTE — Patient Instructions (Signed)
Thank you for coming in today, it was so nice to see you! Today we talked about:    High blood pressure: Continue taking the hydrochlorothiazide as prescribed.  Try to take it every single day as this will give you the best results to prevent against strokes and heart attacks.  We would like for you to take a daily multivitamin, you will need to make sure the multivitamin has iron, B44, and folic acid  Free right thumb pain, this could be from arthritis versus gout.  Both of them are treated the same.  We are going to start you on naproxen daily for the next week.  Do not take ibuprofen while you are taking naproxen.  After a week of taking naproxen you can resume the ibuprofen  Please follow up in 1 week if your right thumb still has no improvement in pain.   If you have any questions or concerns, please do not hesitate to call the office at [phone]. You can also message me directly via MyChart.   Sincerely,  Smitty Cords, MD

## 2017-07-21 NOTE — Assessment & Plan Note (Signed)
Controlled today.  Hydrochlorothiazide was started on March 6, she has been doing well with this - Follow-up in 3 months

## 2017-07-21 NOTE — Assessment & Plan Note (Signed)
New DIP joint swelling and pain.  Suspect osteoarthritis versus possible gout.  Would be an atypical location for a gout flare however not impossible given that she was recently started on hydrochlorothiazide and she also drinks about 3 alcoholic beverages a day -We will treat with 1 week of naproxen, see specific dosing below - Do not take any other NSAIDs while on naproxen - Advised decreasing alcohol to no more than 2 drinks a day -Continue hydrochlorothiazide for now for the antihypertensive benefits

## 2017-07-21 NOTE — Assessment & Plan Note (Signed)
Has alcohol use and abuse on problem list.  Does not endorse abusing alcohol however patient endorses drinking 3 light beers a day  -Advised cutting back to no more than 2 alcoholic drinks a day

## 2017-09-23 ENCOUNTER — Other Ambulatory Visit: Payer: Self-pay

## 2017-09-23 ENCOUNTER — Ambulatory Visit (INDEPENDENT_AMBULATORY_CARE_PROVIDER_SITE_OTHER): Payer: BC Managed Care – PPO | Admitting: Family Medicine

## 2017-09-23 ENCOUNTER — Encounter: Payer: Self-pay | Admitting: Family Medicine

## 2017-09-23 VITALS — BP 132/82 | HR 89 | Temp 98.1°F | Ht 67.0 in | Wt 157.6 lb

## 2017-09-23 DIAGNOSIS — Q682 Congenital deformity of knee: Secondary | ICD-10-CM

## 2017-09-23 DIAGNOSIS — R21 Rash and other nonspecific skin eruption: Secondary | ICD-10-CM | POA: Diagnosis not present

## 2017-09-23 DIAGNOSIS — M25562 Pain in left knee: Secondary | ICD-10-CM

## 2017-09-23 DIAGNOSIS — M25561 Pain in right knee: Secondary | ICD-10-CM | POA: Diagnosis not present

## 2017-09-23 MED ORDER — HYDROCORTISONE 0.5 % EX CREA: 1.0000 "application " | TOPICAL_CREAM | Freq: Two times a day (BID) | CUTANEOUS | 0 refills | Status: AC

## 2017-09-23 MED ORDER — IBUPROFEN 600 MG PO TABS: 600.0000 mg | ORAL_TABLET | Freq: Three times a day (TID) | ORAL | 0 refills | Status: DC | PRN

## 2017-09-23 NOTE — Patient Instructions (Signed)
Thank you for coming in today, it was so nice to see you! Today we talked about:    Rash: Please take the cream as prescribed.  If your rash does not go away within the next couple weeks please make an appointment  Knee pain; we are going to order some x-rays of your knees.  You can have these done anytime during Monday through Friday 9-5.  Continue taking ibuprofen as prescribed.  Please schedule an appointment in the next couple weeks and we will do a knee injection  If we ordered any tests today, you will be notified via telephone of any abnormalities. If everything is normal you will get a letter in the mail.   If you have any questions or concerns, please do not hesitate to call the office at [phone]. You can also message me directly via MyChart.   Sincerely,  Smitty Cords, MD

## 2017-09-23 NOTE — Progress Notes (Signed)
Subjective:    Patient ID: [name] , female   DOB: 16-Jun-1960 , 57 y.o..   MRN: [id number]  HPI  [name] is here for   1. Facial Rash: Patient notes that her face is "breaking out for the last week.  She notes that the rash does not itch at all.  She notes that she has not tried any new soaps, creams, lotions.  She thinks that it may look a little better than when it first started however it is bothersome to her.  She notes that she has not been outside or in the [facility] recently.  No bug bites.  No fevers, nausea, vomiting.  No URI symptoms.  She notes that she did go on a tour bus a couple weeks ago and rested her head on the seat, she is wondering if maybe the seat gave her the rash.  No one else she knows has something like this.  2. Knee pain: Patient endorses bilateral knee pain for several months now.  She notes that her knee pain is worse at the end of the day, especially when ambulating a lot.  She notes that she has various other joint issues that she takes naproxen for.  She denies any trauma to her knees, no previous surgeries.  Denies any weakness, numbness, tingling.  Review of Systems: Per HPI.   Past Medical History: Patient Active Problem List   Diagnosis Date Noted  . Former heavy tobacco smoker 06/15/2017  . Bilateral hand pain 03/25/2017  . Right hip pain 03/25/2017  . Alcohol use   . Thumb pain, right 09/02/2016  . Hypertension 03/08/2012  . Foot pain 03/08/2012    Medications: reviewed   Social Hx:  reports that she quit smoking about 9 months ago. Her smoking use included cigarettes. She started smoking about 40 years ago. She has a 9.75 pack-year smoking history. She has never used smokeless tobacco.   Objective:   BP 132/82   Pulse 89   Temp 98.1 F (36.7 C) (Oral)   Ht 5\' 7"  (1.702 m)   Wt 157 lb 9.6 oz (71.5 kg)   LMP 04/12/2004   SpO2 98%   BMI 24.68 kg/m  Physical Exam  Gen: NAD, alert, cooperative with exam,  well-appearing HEENT: NCAT, PERRL, clear conjunctiva, oropharynx clear, supple neck Skin: Macular hypopigmented rash on face only Bilateral Knees: Normal to inspection with no erythema or effusion or obvious bony abnormalities. Palpation without warmth, positive medial and lateral joint line tenderness bilaterally, no patellar tenderness, or condyle tenderness. ROM full in flexion and extension and lower leg rotation. Ligaments with solid consistent endpoints including ACL, PCL, LCL, MCL. Strength: 5/5 Hamstring and quadriceps  Negative Thessalonian tests. Non painful patellar compression. Patellar glide with crepitus. Neurovascularly intact   Assessment & Plan:   1. Pain in both knees: Initially suspected that patient had bilateral OA secondary to medial and lateral joint line tenderness.  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  - ibuprofen (ADVIL,MOTRIN) 600 MG tablet; Take 1 tablet (600 mg total) by mouth every 8 (eight) hours as needed.  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 Rash and nonspecific skin eruption: Unclear etiology of her rash on her face at this point.  Could  be eczematous versus contact related.  Patient seen by Dr. Erin Hearing as well who recommended hydrocortisone ointment and follow-up - hydrocortisone cream 0.5 %; Apply 1 application topically 2 (two) times daily for 7 days.  Dispense: 30 g; Refill: 0 -Follow up in 1-2 weeks  Smitty Cords, MD [place], PGY-3

## 2017-09-26 ENCOUNTER — Ambulatory Visit
Admission: RE | Admit: 2017-09-26 | Discharge: 2017-09-26 | Disposition: A | Discharge status: Home / Self Care | Payer: BC Managed Care – PPO | Source: Ambulatory Visit | Attending: Family Medicine | Admitting: Family Medicine

## 2017-09-26 ENCOUNTER — Other Ambulatory Visit: Payer: Self-pay | Admitting: Family Medicine

## 2017-09-26 DIAGNOSIS — M25562 Pain in left knee: Principal | ICD-10-CM

## 2017-09-26 DIAGNOSIS — M25561 Pain in right knee: Secondary | ICD-10-CM

## 2017-09-28 MED ORDER — HYDROCHLOROTHIAZIDE 12.5 MG PO TABS: 12.5000 mg | ORAL_TABLET | Freq: Every day | ORAL | 3 refills | Status: DC

## 2017-09-30 ENCOUNTER — Encounter: Payer: Self-pay | Admitting: Family Medicine

## 2017-09-30 ENCOUNTER — Ambulatory Visit (INDEPENDENT_AMBULATORY_CARE_PROVIDER_SITE_OTHER): Payer: BC Managed Care – PPO | Admitting: Family Medicine

## 2017-09-30 VITALS — BP 142/65 | Ht 67.0 in | Wt 156.0 lb

## 2017-09-30 DIAGNOSIS — G8929 Other chronic pain: Secondary | ICD-10-CM

## 2017-09-30 DIAGNOSIS — M25561 Pain in right knee: Secondary | ICD-10-CM | POA: Diagnosis not present

## 2017-09-30 DIAGNOSIS — M25562 Pain in left knee: Secondary | ICD-10-CM | POA: Diagnosis not present

## 2017-09-30 DIAGNOSIS — M79644 Pain in right finger(s): Secondary | ICD-10-CM | POA: Diagnosis not present

## 2017-09-30 MED ORDER — METHYLPREDNISOLONE ACETATE 40 MG/ML IJ SUSP
20.0000 mg | Freq: Once | INTRAMUSCULAR | Status: AC
  Administered 2017-09-30: 20 mg via INTRA_ARTICULAR

## 2017-09-30 MED ORDER — MELOXICAM 15 MG PO TABS: ORAL_TABLET | ORAL | 1 refills | Status: DC

## 2017-09-30 NOTE — Progress Notes (Signed)
Chief complaint: Right thumb pain x12 months, bilateral knee pain x4 months  History of present illness: [name] is a 57 year old right-hand-dominant female who presents to the sports medicine office today with a couple of different issues she would like to discuss.  Most pressing for her today is right thumb pain.  She reports that symptoms have been present for approximately 12 months now.  She does not report of any specific inciting incident, trauma, or injury to explain the pain.  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 She reports having pain when cooking and near the end of the day. She points to the IP joint of her right thumb as to where she feels pain.  She also reports feeling pain at the MCP as well as the first [facility] joint on the right thumb.  She reports pain at the IP joint is the most excruciating.  She has also noticed swelling and stiffness in the right thumb at the IP joint.  She does not report of any locking or triggering of the right thumb.  She does not report a history of trigger thumb or trigger finger.  She reports symptoms have been unchanged over the last year.  She reports that she has used occasional ibuprofen and Tylenol, which has not helped so much.  She does report slight weakness with grip strength secondary to pain.  She reports any type of flexion or extension of the right thumb are main aggravating factors.  She describes the pain as a throbbing, aching, and occasionally sharp pain.  She does not report of any numbness, tingling, or burning paresthesias.  Back in December 2018, she was evaluated for this at her primary care's office, had XR which does show some arthritis at the first [facility] joint.  In regards to bilateral knee pain, she reports that symptoms have been present for approximately 4 months.  She does not report of any specific inciting incident, trauma, or injury to explain the pain.  She points she did have x-rays of her right hand  along both medial lateral joint lines of both knees as to where she will feel pain.  She also reports feeling stiffness.  She reports specifically when standing from a seated position and starting to walk is when she will feel stiff in her knees.  She does not report of any swelling in her knees.  She occasionally reports having painful popping, locking, catching, and symptoms of giving way.  Fortunately, she does not report of any falls.  She reports mainly just feeling stiff in the knees, occasional feel throbbing, nonradiating pain.  She does not report of any previous knee injury or trauma.  She did have x-rays done of her both knees 4 days ago.  She was kindly referred here by primary care physician for further evaluation.  Review of systems:  As stated above  Her past medical history, surgical history, family history, and social history obtained and reviewed.  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  Physical exam: Vital signs are reviewed and are documented in the chart Gen.: Alert, oriented, appears stated age, in no apparent distress HEENT: Moist oral  mucosa Respiratory: Normal respirations, able to speak in full sentences Cardiac: Regular rate, distal pulses 2+ Integumentary: No rashes on visible skin:  Neurologic: She does have intact strength with bilateral thumb flexion and extension at the IP joint and MCP joint, she does have intact grip strength on the right side, she does have intact strength with thumb abduction and thumb opposition, she does have intact strength with bilateral knee flexion and knee extension, she also has intact  strength with bilateral hip flexion and hip abduction, sensation 2+ in bilateral hands and fingers, as well as bilateral lower extremities Psych: Normal affect, mood is described as good Musculoskeletal: Inspection of her right thumb reveals no obvious deformity or muscle atrophy, no warmth, erythema, or ecchymosis noted, she does have very slight puffiness noted on the dorsal aspect of the right thumb at the IP joint, she is tender palpation on both the dorsal and volar aspect of the thumb at the IP joint, MCP joint, as well as on the dorsal aspect of the first [facility] joint, no tenderness over the scaphoid, anatomic snuffbox, or distal radius, no tenderness elsewhere over the carpal bones, no tenderness elsewhere over the MCP joints, she does have full range of motion of her right wrist, grind test and Finkelstein test negative; inspection of both of her knees reveal no obvious deformity or muscle atrophy, no warmth, erythema, ecchymosis, or effusion, she is slightly tender to palpation along both the medial joint lines of both knees, slight tenderness over the lateral joint lines of both knees, no signs of ligamentous instability as Lachman, anterior drawer, valgus, and varus stress testing negative, McMurray negative, she does have great range of motion in both knees going from 0 degrees to 145 degrees, no antalgic gait on ambulation  Limited musculoskeletal ultrasound was performed in the office today of her right thumb -She does have slight hypoechoic changes seen at the [facility] joint, with rim of calcification noted  -Very minimal osteoarthritic changes noted at the IP joint without any notable hypoechogenicity -Slight beginning osteoarthritic changes with slight hypoechogenicity at the MCP joint   Impression: 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  They were obtained on 09/26/2017.  On my personal review, I do see slight, minimal osteoarthritic changes seen involving the lateral compartments of both knees, with patella alta seen on both sides.  She does have good spacing on sunrise view.  Procedure:  After written informed consent signed and obtained, and benefit of pain relief and risk of bleeding, infection, and steroid flare discussed, [name] agreed to proceed with cortisone injection into the right thumb at the MCP joint and IP joint . After timeout, area  was cleaned with alcohol wipes. Sterile ultrasound cover for probe and sterile gel were used, with ultrasound being used to accurately show the joints. Using 0.5 cc 1% lidocaine without epinephrine and 0.5 cc of 40 mg Depo-Medrol on a 25-gauge 1 inch needle, her right 1st MCP joint was injected. Then, using 0.5 cc 1% lidocaine without epinephrine and 0.5 cc of 40 mg Depo-Medrol on a 25-gauge 1 inch needle, her right IP joint was injected. No complications noted from procedure.  Assessment and plan: 1.  Right thumb pain, suspect secondary to osteoarthritic changes of the right  thumb, clinically worse at the IP joint, radiographically worse at the first [facility] joint 2.  Bilateral knee pain and stiffness, suspect secondary to mild degenerative osteoarthritic changes seen in the lateral compartment, with patella alta bilaterally  Plan: Discussed with [name] today that her right thumb pain is most likely coming from osteoarthritis.  Discussed various treatment options and plans with her today to include having her started on an anti-inflammatory medication or doing cortisone injection under ultrasound guidance today into her thumb.  She would like to proceed with cortisone injection.  Given pain at the IP joint, with findings more proximally along the [facility] and MCP, will pursue two different injections today.  Under ultrasound  guidance, her right first MCP and IP joint were injected.  This was done under ultrasound guidance without any complications noted.  Will start her on meloxicam 15 mg daily.  Discussed not to use any other ibuprofen, Motrin, or Aleve while on this.  Hopefully, this will also help with her knee pain.  Discussed osteoarthritis is most likely the main cause of having the stiffness in her knees.  Discussed  home exercise program working on quadriceps, hamstring, and hip abduction strengthening.  Also discussed importance of hamstring stretching. She will return in the office in 4 weeks for reevaluation or sooner as needed.     Mort Sawyers, M.D. [facility]

## 2017-10-03 NOTE — Progress Notes (Signed)
[facility]: Attending Note: I have reviewed the chart, discussed wit the Sports Medicine Fellow. I agree with assessment and treatment plan as detailed in the Fellow's note.  

## 2017-10-26 ENCOUNTER — Ambulatory Visit: Payer: BC Managed Care – PPO | Admitting: Family Medicine

## 2018-01-09 ENCOUNTER — Other Ambulatory Visit: Payer: Self-pay | Admitting: Family Medicine

## 2018-01-09 DIAGNOSIS — M25562 Pain in left knee: Principal | ICD-10-CM

## 2018-01-09 DIAGNOSIS — M25561 Pain in right knee: Secondary | ICD-10-CM

## 2018-01-09 MED ORDER — HYDROCHLOROTHIAZIDE 12.5 MG PO TABS: 12.5000 mg | ORAL_TABLET | Freq: Every day | ORAL | 3 refills | Status: DC

## 2018-01-09 NOTE — Telephone Encounter (Signed)
Pt needs a refill on her BP medication and Ibuprofen called in. jw

## 2018-01-09 NOTE — Telephone Encounter (Signed)
Refilled antihypertensive of hydrochlorothiazide.  Please call patient and let her know that with her hypertension I do not recommend routine use of ibuprofen or medications in the NSAID class such as meloxicam.  She can use 1 of these medications once or twice a week.  These can elevate her blood pressure and damage her kidneys in the long-term.  I recommend Tylenol at a maximal dose of 3000 mill grams a day.

## 2018-01-11 ENCOUNTER — Other Ambulatory Visit: Payer: Self-pay

## 2018-01-11 DIAGNOSIS — M25562 Pain in left knee: Principal | ICD-10-CM

## 2018-01-11 DIAGNOSIS — M25561 Pain in right knee: Secondary | ICD-10-CM

## 2018-01-11 MED ORDER — IBUPROFEN 600 MG PO TABS: 600.0000 mg | ORAL_TABLET | Freq: Three times a day (TID) | ORAL | 0 refills | Status: DC | PRN

## 2018-02-06 ENCOUNTER — Other Ambulatory Visit: Payer: Self-pay | Admitting: Family Medicine

## 2018-02-06 DIAGNOSIS — Z1231 Encounter for screening mammogram for malignant neoplasm of breast: Secondary | ICD-10-CM

## 2018-02-22 ENCOUNTER — Ambulatory Visit (INDEPENDENT_AMBULATORY_CARE_PROVIDER_SITE_OTHER): Payer: BC Managed Care – PPO | Admitting: Family Medicine

## 2018-02-22 ENCOUNTER — Other Ambulatory Visit: Payer: Self-pay

## 2018-02-22 ENCOUNTER — Encounter: Payer: Self-pay | Admitting: Family Medicine

## 2018-02-22 ENCOUNTER — Encounter: Payer: Self-pay | Admitting: Gastroenterology

## 2018-02-22 VITALS — BP 162/80 | HR 86 | Temp 98.0°F | Ht 67.0 in | Wt 162.0 lb

## 2018-02-22 DIAGNOSIS — D649 Anemia, unspecified: Secondary | ICD-10-CM

## 2018-02-22 DIAGNOSIS — I10 Essential (primary) hypertension: Secondary | ICD-10-CM

## 2018-02-22 DIAGNOSIS — M7541 Impingement syndrome of right shoulder: Secondary | ICD-10-CM | POA: Diagnosis not present

## 2018-02-22 DIAGNOSIS — M19041 Primary osteoarthritis, right hand: Secondary | ICD-10-CM

## 2018-02-22 DIAGNOSIS — Z1211 Encounter for screening for malignant neoplasm of colon: Secondary | ICD-10-CM

## 2018-02-22 DIAGNOSIS — M79644 Pain in right finger(s): Secondary | ICD-10-CM

## 2018-02-22 MED ORDER — ACETAMINOPHEN 500 MG PO TABS: 1000.0000 mg | ORAL_TABLET | Freq: Four times a day (QID) | ORAL | 3 refills | Status: DC | PRN

## 2018-02-22 MED ORDER — NAPROXEN 500 MG PO TABS: 500.0000 mg | ORAL_TABLET | Freq: Two times a day (BID) | ORAL | 0 refills | Status: DC

## 2018-02-22 NOTE — Patient Instructions (Signed)
It was wonderful to see you today.  Thank you for choosing [facility].   Please call [phone] with any questions about today's appointment.  Please be sure to schedule follow up at the front  desk before you leave today.   Carina Brown, MD  Family Medicine    

## 2018-02-22 NOTE — Progress Notes (Signed)
Patient Name: [name] Date of Birth: 1961/04/01 Date of Visit: 02/22/18 PCP: Martyn Malay, MD  Chief Complaint: right sided back and shoulder pain   Subjective: [name] is a pleasant 57 y.o. year old woman with a history of HTN presenting with right sided shoulder pain.  The patient reports a 6-day history of right-sided shoulder and back pain.  She works as a Database administrator.  She reports the onset of right-sided pain after lifting many heavy boxes last week.  She reports most of her pain is localized along her right superior posterior shoulder.  The pain extends down to her latissimus with intermittent spasms.  She denies chest pain, worsening with exercise dyspnea or cough.  The pain is not worsened by breathing.  She reports the pain is worse when she raises her right arm up or tries to lift heavy boxes.  She has missed numerous days of work.  She denies right hand numbness or tingling.  She does endorse some weakness in her right hand related to her arthritis she believes, as described below.  She denies neck pain, low back pain, bilateral lower extremity numbness or weakness, saddle anesthesia or urinary or bowel habit changes.  She has tried ibuprofen and ice for her symptoms with some relief.  She has also been trying to stretch and this is provided some relief.  The patient is right-hand dominant.  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  She was previously diagnosed with right first digit osteoarthritis.  She underwent injection in July.  She reports her right hand since that time has had minimal improvement in pain and she has noticed that her right hand is weaker.  She reports she sometimes drops items due to this weakness.  The patient is taking her blood pressure medication today.  She denies headaches, chest pain, difficulty breathing or lower extremity edema. ROS:  ROS As above I have reviewed the patient's  medical, surgical, family, and social history as appropriate.   Vitals:   02/22/18 0952  BP: (!) 162/80  Pulse: 86  Temp: 98 F (36.7 C)  SpO2: 99%   Filed Weights   02/22/18 0952  Weight: 162 lb (73.5 kg)   Repeat blood pressure was 142/82 HEENT: Sclera anicteric. Dentition is moderate. Appears well hydrated. Neck: Supple Cardiac: Regular rate and rhythm. Normal S1/S2. No murmurs, rubs, or gallops appreciated. Lungs: Clear bilaterally to ascultation.  Abdomen: Normoactive bowel sounds. No tenderness to deep or light palpation. No rebound or guarding.  Extremities: Warm, well perfused without edema.  Skin: Warm, dry Psych: Pleasant and appropriate  Shoulder exam: Bilateral shoulders normal in appearance full range of motion with forward flexion and abduction.  The pain is reproduced with forward flexion at approximately 110 degrees.  5 out of 5 strength with internal and external rotation.  Positive Hawkins test, negative empty can test on the right Negative Spurling's Hand exam bilateral hands examined.  Right hand is notable for multiple Heberden's and Bouchard's nodes.  There is crepitus with flexion extension abduction and opposition of the right thumb.  She has some pain with loading of the MCP  as well  Elsa was seen today for back pain.  Diagnoses and all orders for this visit:  Essential hypertension, continue HCTZ  -     Basic Metabolic Panel  Primary osteoarthritis of right hand, previously received injection, could also consider OT evaluation in  future for strength.  -     Ambulatory referral to Hand Surgery  Impingement syndrome of right shoulder, recommended range of motion exercises including pendulum swing.  Recommended heat.  Differential also includes cervical disc disease although I think this is less likely.  I do think there is a component of degenerative disc disease as well.  Her right-sided arm and shoulder pain does not appear in any way to be related to  exertion I do not think this is an angina equivalent. I think this pain likely represents an overuse injury.  I recommended resting her arm for 1-2 more days then returning to work for light duty and then she can return to full work. -     Ambulatory referral to Physical Therapy -     naproxen (NAPROSYN) 500 MG tablet; Take 1 tablet (500 mg total) by mouth 2 (two) times daily with a meal. -     acetaminophen (TYLENOL) 500 MG tablet; Take 2 tablets (1,000 mg total) by mouth every 6 (six) hours as needed for mild pain.  Normocytic anemia likely due to excess alcohol use.  Did not address today.  We did discuss her anemia.  Will evaluate further as below. -     CBC -     Ferritin -     Vitamin B12 -     Folate  Screening for colon cancer -     Ambulatory referral to Gastroenterology    Dorris Singh, MD  [facility] Medicine Teaching Service

## 2018-02-23 ENCOUNTER — Encounter: Payer: Self-pay | Admitting: Family Medicine

## 2018-02-23 DIAGNOSIS — R7989 Other specified abnormal findings of blood chemistry: Secondary | ICD-10-CM | POA: Insufficient documentation

## 2018-02-23 LAB — BASIC METABOLIC PANEL
BUN/Creatinine Ratio: 19 (ref 9–23)
BUN: 14 mg/dL (ref 6–24)
CO2: 25 mmol/L (ref 20–29)
CREATININE: 0.75 mg/dL (ref 0.57–1.00)
Calcium: 9.9 mg/dL (ref 8.7–10.2)
Chloride: 104 mmol/L (ref 96–106)
GFR calc Af Amer: 103 mL/min/{1.73_m2} (ref >59)
GFR calc non Af Amer: 89 mL/min/{1.73_m2} (ref >59)
GLUCOSE: 111 mg/dL — AB (ref 65–99)
Potassium: 4.4 mmol/L (ref 3.5–5.2)
SODIUM: 144 mmol/L (ref 134–144)

## 2018-02-23 LAB — CBC
HEMATOCRIT: 34.8 % (ref 34.0–46.6)
Hemoglobin: 11.5 g/dL (ref 11.1–15.9)
MCH: 31.8 pg (ref 26.6–33.0)
MCHC: 33 g/dL (ref 31.5–35.7)
MCV: 96 fL (ref 79–97)
PLATELETS: 410 10*3/uL (ref 150–450)
RBC: 3.62 x10E6/uL — AB (ref 3.77–5.28)
RDW: 12.4 % (ref 12.3–15.4)
WBC: 7.5 10*3/uL (ref 3.4–10.8)

## 2018-02-23 LAB — FOLATE: Folate: 7.9 ng/mL (ref >3.0)

## 2018-02-23 LAB — VITAMIN B12: VITAMIN B 12: 358 pg/mL (ref 232–1245)

## 2018-02-23 LAB — FERRITIN: FERRITIN: 254 ng/mL — AB (ref 15–150)

## 2018-02-28 ENCOUNTER — Telehealth: Payer: Self-pay | Admitting: Family Medicine

## 2018-02-28 NOTE — Telephone Encounter (Signed)
FMLA form completed, in Nursing Box.

## 2018-02-28 NOTE — Telephone Encounter (Signed)
Patient aware that forms are available for pick up at front desk. Copy made for batch scanning.   Cordia Miklos, RN ([facility] RN)    

## 2018-02-28 NOTE — Telephone Encounter (Signed)
Reviewed FMLA form and placed in PCP's box for completion.  [name], Elizabeth

## 2018-02-28 NOTE — Telephone Encounter (Signed)
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  Verified that patient section of form has been completed.  Last DOS/WCC with PCP was 02/22/2018.  Placed form in team folder to be completed by clinical staff.  [name]

## 2018-03-08 ENCOUNTER — Ambulatory Visit (AMBULATORY_SURGERY_CENTER): Payer: Self-pay

## 2018-03-08 VITALS — Ht 67.5 in | Wt 160.0 lb

## 2018-03-08 DIAGNOSIS — Z1211 Encounter for screening for malignant neoplasm of colon: Secondary | ICD-10-CM

## 2018-03-08 NOTE — Progress Notes (Signed)
Denies allergies to eggs or soy products. Denies complication of anesthesia or sedation. Denies use of weight loss medication. Denies use of O2.   Emmi instructions declined.  

## 2018-03-15 ENCOUNTER — Ambulatory Visit: Payer: BC Managed Care – PPO

## 2018-03-16 ENCOUNTER — Encounter: Payer: Self-pay | Admitting: Gastroenterology

## 2018-03-20 ENCOUNTER — Ambulatory Visit
Admission: RE | Admit: 2018-03-20 | Discharge: 2018-03-20 | Disposition: A | Discharge status: Home / Self Care | Payer: BC Managed Care – PPO | Source: Ambulatory Visit | Attending: Family Medicine | Admitting: Family Medicine

## 2018-03-20 DIAGNOSIS — Z1231 Encounter for screening mammogram for malignant neoplasm of breast: Secondary | ICD-10-CM

## 2018-03-30 ENCOUNTER — Encounter: Payer: Self-pay | Admitting: Gastroenterology

## 2018-03-30 ENCOUNTER — Ambulatory Visit (AMBULATORY_SURGERY_CENTER): Payer: BC Managed Care – PPO | Admitting: Gastroenterology

## 2018-03-30 VITALS — BP 133/79 | HR 67 | Temp 97.3°F | Resp 13 | Ht 67.0 in | Wt 162.0 lb

## 2018-03-30 DIAGNOSIS — Z1211 Encounter for screening for malignant neoplasm of colon: Secondary | ICD-10-CM | POA: Diagnosis present

## 2018-03-30 DIAGNOSIS — D120 Benign neoplasm of cecum: Secondary | ICD-10-CM | POA: Diagnosis not present

## 2018-03-30 MED ORDER — SODIUM CHLORIDE 0.9 % IV SOLN: 500.0000 mL | Freq: Once | INTRAVENOUS | Status: DC

## 2018-03-30 NOTE — Progress Notes (Signed)
Called to room to assist during endoscopic procedure.  Patient ID and intended procedure confirmed with present staff. Received instructions for my participation in the procedure from the performing physician.  

## 2018-03-30 NOTE — Progress Notes (Signed)
Pt's states no medical or surgical changes since previsit or office visit. 

## 2018-03-30 NOTE — Progress Notes (Signed)
Report given to PACU, vss 

## 2018-03-30 NOTE — Patient Instructions (Signed)
Handouts given for polyps and diverticulosis  YOU HAD AN ENDOSCOPIC PROCEDURE TODAY AT [facility]:   Refer to the procedure report that was given to you for any specific questions about what was found during the examination.  If the procedure report does not answer your questions, please call your gastroenterologist to clarify.  If you requested that your care partner not be given the details of your procedure findings, then the procedure report has been included in a sealed envelope for you to review at your convenience later.  YOU SHOULD EXPECT: Some feelings of bloating in the abdomen. Passage of more gas than usual.  Walking can help get rid of the air that was put into your GI tract during the procedure and reduce the bloating. If you had a lower endoscopy (such as a colonoscopy or flexible sigmoidoscopy) you may notice spotting of blood in your stool or on the toilet paper. If you underwent a bowel prep for your procedure, you may not have a normal bowel movement for a few days.  Please Note:  You might notice some irritation and congestion in your nose or some drainage.  This is from the oxygen used during your procedure.  There is no need for concern and it should clear up in a day or so.  SYMPTOMS TO REPORT IMMEDIATELY:   Following lower endoscopy (colonoscopy or flexible sigmoidoscopy):  Excessive amounts of blood in the stool  Significant tenderness or worsening of abdominal pains  Swelling of the abdomen that is new, acute  Fever of 100F or higher  For urgent or emergent issues, a gastroenterologist can be reached at any hour by calling [phone].   DIET:  We do recommend a small meal at first, but then you may proceed to your regular diet.  Drink plenty of fluids but you should avoid alcoholic beverages for 24 hours.  ACTIVITY:  You should plan to take it easy for the rest of today and you should NOT DRIVE or use heavy machinery until tomorrow (because of  the sedation medicines used during the test).    FOLLOW UP: Our staff will call the number listed on your records the next business day following your procedure to check on you and address any questions or concerns that you may have regarding the information given to you following your procedure. If we do not reach you, we will leave a message.  However, if you are feeling well and you are not experiencing any problems, there is no need to return our call.  We will assume that you have returned to your regular daily activities without incident.  If any biopsies were taken you will be contacted by phone or by letter within the next 1-3 weeks.  Please call us at [phone] if you have not heard about the biopsies in 3 weeks.    SIGNATURES/CONFIDENTIALITY: You and/or your care partner have signed paperwork which will be entered into your electronic medical record.  These signatures attest to the fact that that the information above on your After Visit Summary has been reviewed and is understood.  Full responsibility of the confidentiality of this discharge information lies with you and/or your care-partner.

## 2018-03-30 NOTE — Op Note (Signed)
[facility] Patient Name: [name] Procedure Date: 03/30/2018 1:17 PM MRN: [id number] Endoscopist: Remo Lipps P. Havery Moros , MD Age: 57 Referring MD:  Date of Birth: April 08, 1961 Gender: Female Account #: [id number] Procedure:                Colonoscopy Indications:              Screening for colorectal malignant neoplasm, This                            is the patient's first colonoscopy Medicines:                Monitored Anesthesia Care Procedure:                Pre-Anesthesia Assessment:                           - Prior to the procedure, a History and Physical                            was performed, and patient medications and                            allergies were reviewed. The patient's tolerance of                            previous anesthesia was also reviewed. The risks                            and benefits of the procedure and the sedation                            options and risks were discussed with the patient.                            All questions were answered, and informed consent                            was obtained. Prior Anticoagulants: The patient has                            taken no previous anticoagulant or antiplatelet                            agents. ASA Grade Assessment: II - A patient with                            mild systemic disease. After reviewing the risks                            and benefits, the patient was deemed in                            satisfactory condition to undergo the procedure.  After obtaining informed consent, the colonoscope                            was passed under direct vision. Throughout the                            procedure, the patient's blood pressure, pulse, and                            oxygen saturations were monitored continuously. The                            Model PCF-H190DL [id number]) scope was introduced                            through the anus  and advanced to the the cecum,                            identified by appendiceal orifice and ileocecal                            valve. The colonoscopy was performed without                            difficulty. The patient tolerated the procedure                            well. The quality of the bowel preparation was                            good. The ileocecal valve, appendiceal orifice, and                            rectum were photographed. Scope In: 1:33:49 PM Scope Out: 1:51:16 PM Scope Withdrawal Time: 0 hours 14 minutes 54 seconds  Total Procedure Duration: 0 hours 17 minutes 27 seconds  Findings:                 The perianal and digital rectal examinations were                            normal.                           A 3 mm polyp was found in the cecum. The polyp was                            sessile. The polyp was removed with a cold snare.                            Resection and retrieval were complete.                           Scattered medium-mouthed diverticula were found in  the entire colon, most in right colon.                           Anal papilla(e) were hypertrophied.                           The exam was otherwise without abnormality. Complications:            No immediate complications. Estimated blood loss:                            Minimal. Estimated Blood Loss:     Estimated blood loss was minimal. Impression:               - One 3 mm polyp in the cecum, removed with a cold                            snare. Resected and retrieved.                           - Diverticulosis in the entire examined colon.                           - Anal papilla(e) were hypertrophied.                           - The examination was otherwise normal. Recommendation:           - Patient has a contact number available for                            emergencies. The signs and symptoms of potential                            delayed  complications were discussed with the                            patient. Return to normal activities tomorrow.                            Written discharge instructions were provided to the                            patient.                           - Resume previous diet.                           - Continue present medications.                           - Await pathology results. Remo Lipps P. Aarik Blank, MD 03/30/2018 1:55:37 PM This report has been signed electronically.

## 2018-03-31 ENCOUNTER — Telehealth: Payer: Self-pay

## 2018-03-31 NOTE — Telephone Encounter (Signed)
  Follow up Call-  Call back number 03/30/2018  Post procedure Call Back phone  # [phone]  Permission to leave phone message Yes  Some recent data might be hidden     Patient questions:  Do you have a fever, pain , or abdominal swelling? No. Pain Score  0 *  Have you tolerated food without any problems? Yes.    Have you been able to return to your normal activities? Yes.    Do you have any questions about your discharge instructions: Diet   No. Medications  No. Follow up visit  No.  Do you have questions or concerns about your Care? No.  Actions: * If pain score is 4 or above: No action needed, pain <4.

## 2018-04-09 ENCOUNTER — Encounter: Payer: Self-pay | Admitting: Family Medicine

## 2018-04-09 DIAGNOSIS — D369 Benign neoplasm, unspecified site: Secondary | ICD-10-CM | POA: Insufficient documentation

## 2018-04-10 ENCOUNTER — Encounter: Payer: Self-pay | Admitting: Gastroenterology

## 2018-06-07 ENCOUNTER — Other Ambulatory Visit: Payer: Self-pay

## 2018-12-15 IMAGING — DX DG KNEE COMPLETE 4+V*L*
4 series · 4 of 4 positions shown · non-contrast
Comparison: None.

CLINICAL DATA: 57-year-old female with bilateral knee pain and
swelling for 2 months. No history of injury. Initial encounter.

EXAM:
LEFT KNEE - COMPLETE 4+ VIEW

[dg knee complete 4 views left (1 of 4)]
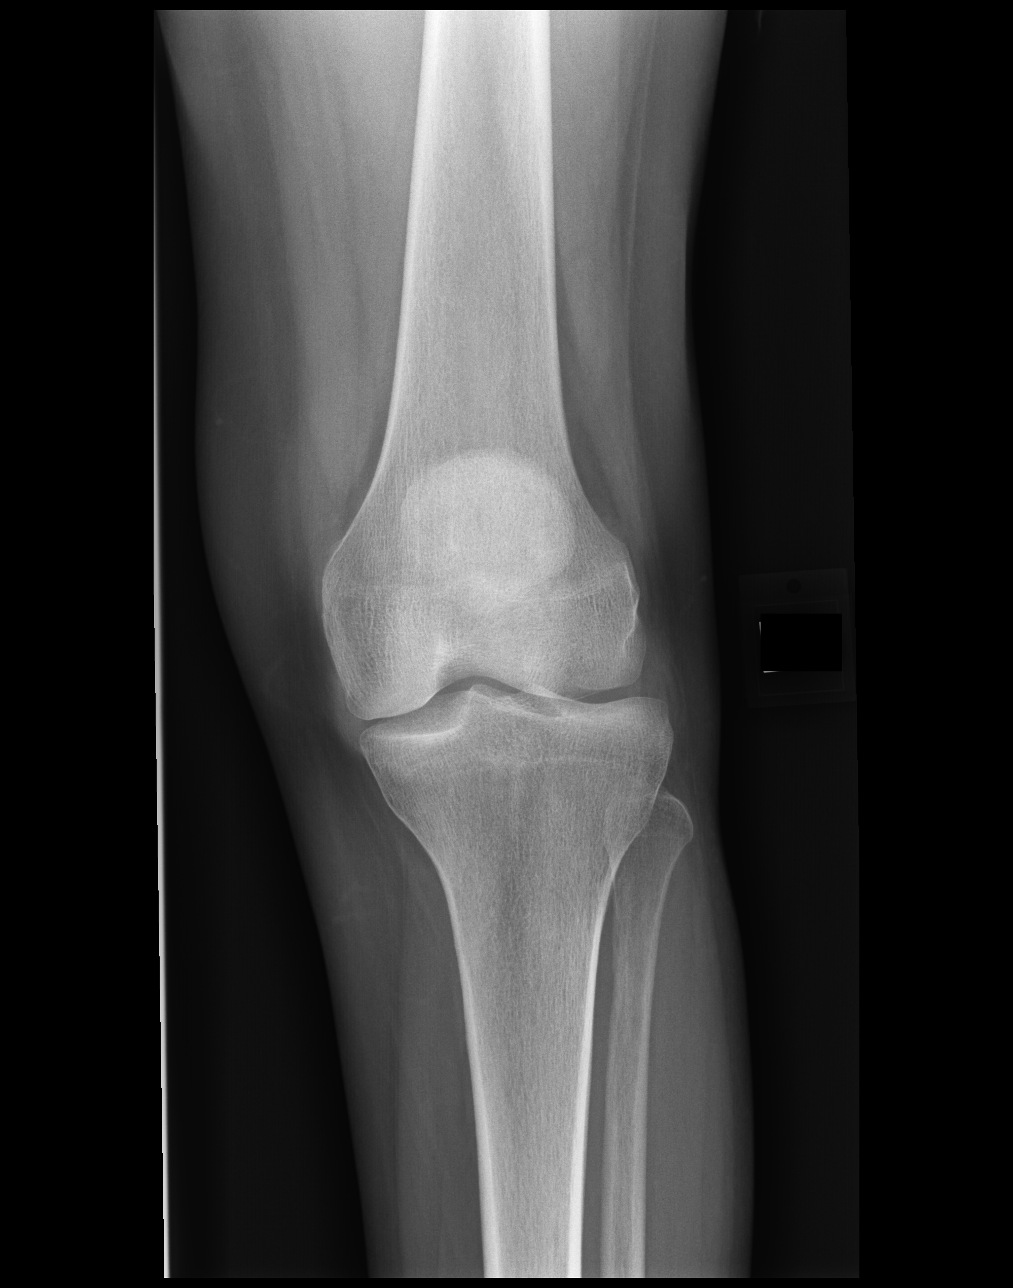

[dg knee complete 4 views left (2 of 4)]
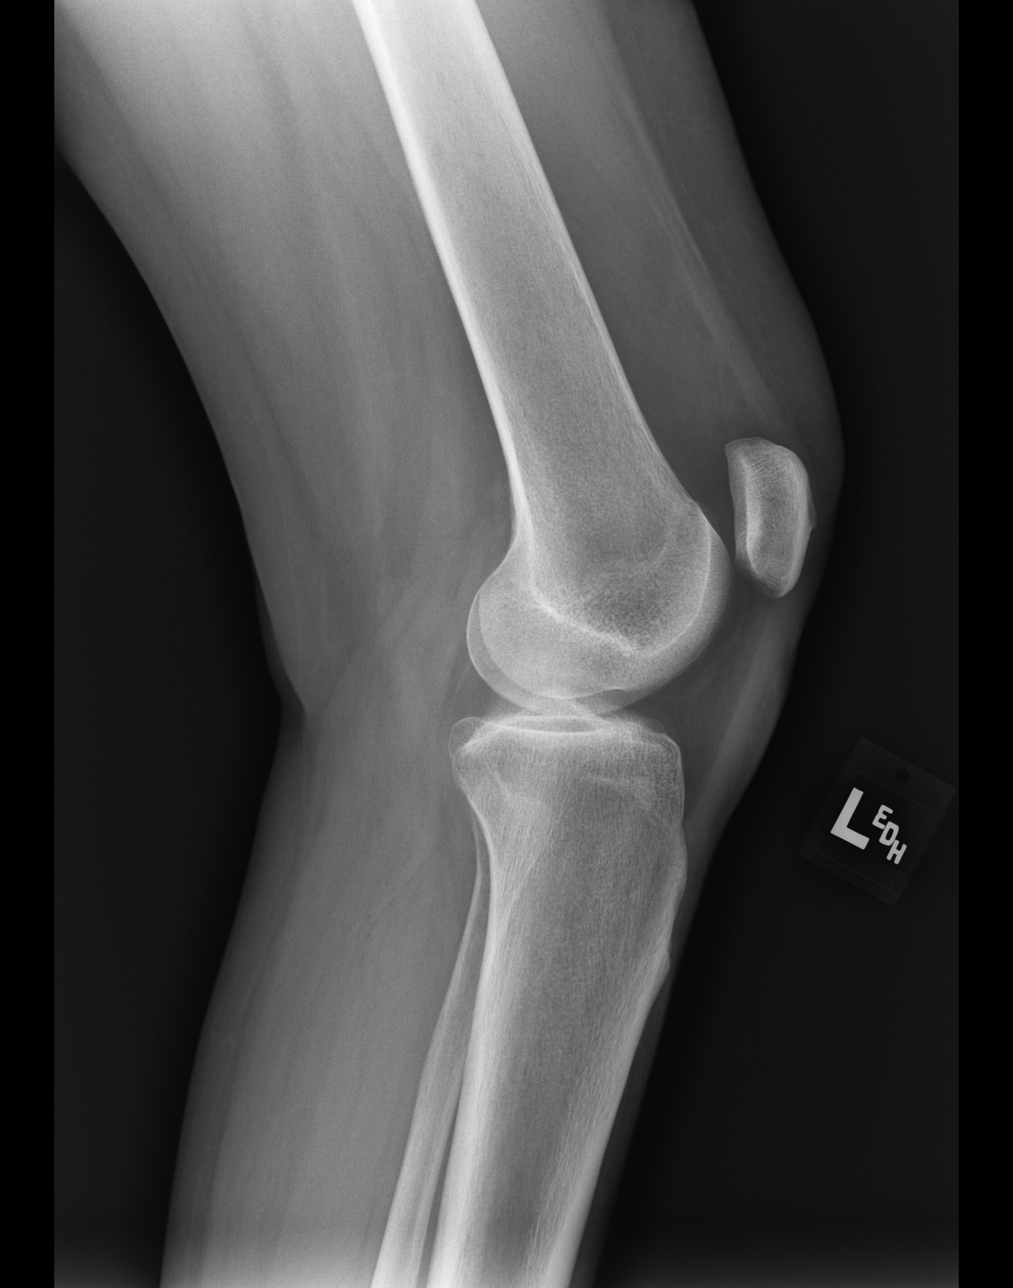

[dg knee complete 4 views left (3 of 4)]
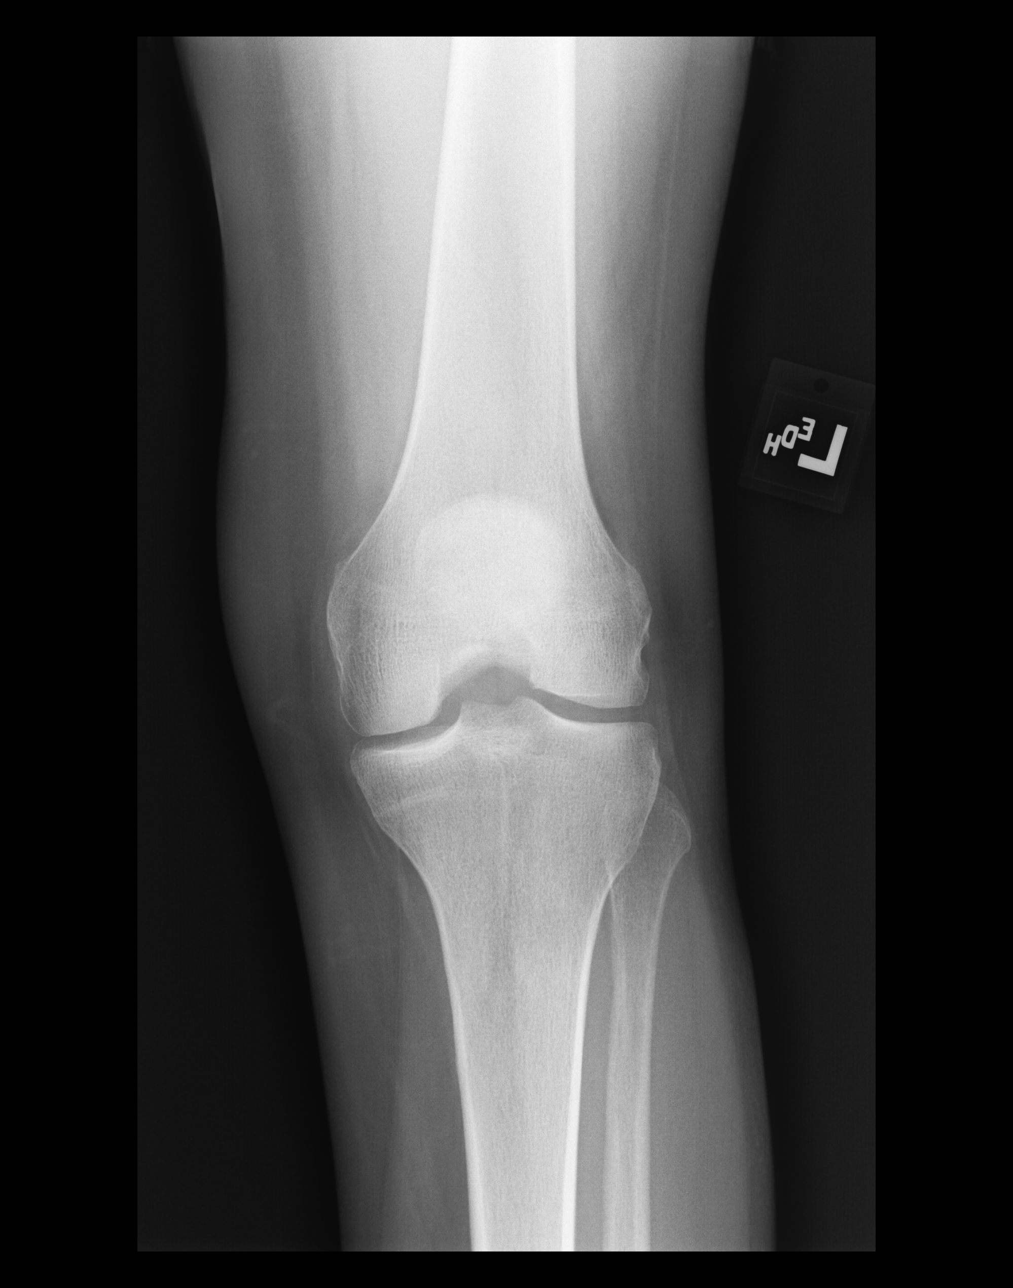

[dg knee complete 4 views left (4 of 4)]
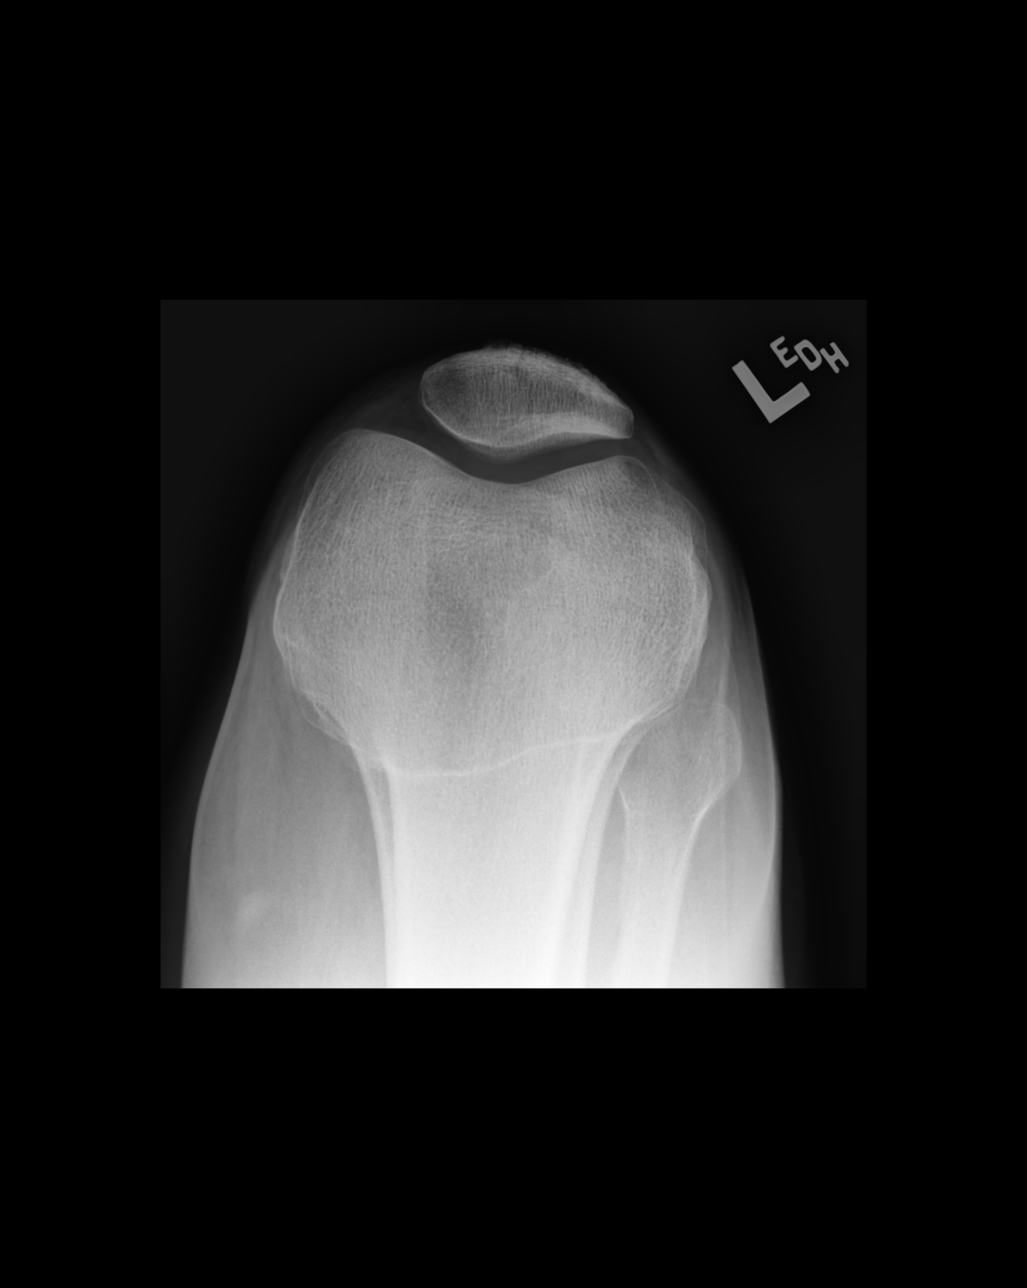

[4 of 4 positions shown; findings below may reference images not displayed]

FINDINGS: Patella Alta.

No significant joint space narrowing.

No joint effusion or soft tissue abnormality noted.

No fracture or dislocation.
IMPRESSION: Patella Alta.

## 2019-02-05 ENCOUNTER — Other Ambulatory Visit: Payer: Self-pay | Admitting: Registered"

## 2019-02-05 DIAGNOSIS — Z20822 Contact with and (suspected) exposure to covid-19: Secondary | ICD-10-CM

## 2019-02-06 LAB — NOVEL CORONAVIRUS, NAA: SARS-CoV-2, NAA: NOT DETECTED

## 2019-02-12 ENCOUNTER — Other Ambulatory Visit: Payer: Self-pay

## 2019-02-12 ENCOUNTER — Other Ambulatory Visit: Payer: Self-pay | Admitting: *Deleted

## 2019-02-12 DIAGNOSIS — Z20822 Contact with and (suspected) exposure to covid-19: Secondary | ICD-10-CM

## 2019-02-12 MED ORDER — HYDROCHLOROTHIAZIDE 12.5 MG PO TABS: 12.5000 mg | ORAL_TABLET | Freq: Every day | ORAL | 0 refills | Status: DC

## 2019-02-13 LAB — NOVEL CORONAVIRUS, NAA: SARS-CoV-2, NAA: NOT DETECTED

## 2019-03-02 ENCOUNTER — Other Ambulatory Visit: Payer: Self-pay | Admitting: Family Medicine

## 2019-03-02 DIAGNOSIS — Z1231 Encounter for screening mammogram for malignant neoplasm of breast: Secondary | ICD-10-CM

## 2019-04-02 ENCOUNTER — Ambulatory Visit (INDEPENDENT_AMBULATORY_CARE_PROVIDER_SITE_OTHER): Payer: BC Managed Care – PPO | Admitting: Family Medicine

## 2019-04-02 ENCOUNTER — Other Ambulatory Visit: Payer: Self-pay

## 2019-04-02 ENCOUNTER — Encounter: Payer: Self-pay | Admitting: Family Medicine

## 2019-04-02 VITALS — BP 160/82 | HR 107 | Ht 67.0 in | Wt 171.8 lb

## 2019-04-02 DIAGNOSIS — M545 Low back pain, unspecified: Secondary | ICD-10-CM | POA: Insufficient documentation

## 2019-04-02 DIAGNOSIS — Z13220 Encounter for screening for lipoid disorders: Secondary | ICD-10-CM

## 2019-04-02 DIAGNOSIS — E663 Overweight: Secondary | ICD-10-CM

## 2019-04-02 DIAGNOSIS — I10 Essential (primary) hypertension: Secondary | ICD-10-CM | POA: Diagnosis not present

## 2019-04-02 DIAGNOSIS — G8929 Other chronic pain: Secondary | ICD-10-CM

## 2019-04-02 MED ORDER — HYDROCHLOROTHIAZIDE 25 MG PO TABS: 25.0000 mg | ORAL_TABLET | Freq: Every day | ORAL | 3 refills | Status: DC

## 2019-04-02 MED ORDER — DICLOFENAC SODIUM 1 % EX GEL: 4.0000 g | Freq: Four times a day (QID) | CUTANEOUS | 3 refills | Status: DC

## 2019-04-02 NOTE — Assessment & Plan Note (Signed)
BMI 26. Discussed reducing Pepsi and other interventions. Patient agreeable---will try to reduce to a few per week.

## 2019-04-02 NOTE — Progress Notes (Signed)
  Patient Name: [name] Date of Birth: [birth year] Date of Visit: 04/02/19 PCP: Martyn Malay, MD  Chief Complaint: BP check  Subjective: [name] is a pleasant 59 y.o. with medical history significant for hypertension presenting today for check up.   Patient continues to work at school, now making lunches for delivery. Reports she is doing okay, staying busy.   Hypertension Taking hydrochlorothiazide 12.5 mg daily. Reports BP elevated at work over past few months. No difficulty with cost or adherence. Reports main issue is she is gaining weight (see below). No headaches, chest pain, dyspnea, vision changes.   Weight gain Patient has gained 10 pounds in 1 year. Drinks soda (pepsi) including for breakfast. She has noted with smoking cessation her appetite is increased. Activity has been reduced due to pandemic.   Intermittent back pain Patient reports a few months of mildly bothersome back pain. This is bilateral, non-radiating. No lower extremity weakness, numbness, no bowel/bladder difficulty. She has been taking ibuprofen ~2-3 times per week. No nocturnal pain. She reports this is worse after lifting and standing all day at work.     ROS: Per HPI.   I have reviewed the patient's medical, surgical, family, and social history as appropriate.  Vitals:   04/02/19 1333  BP: (!) 160/82  Pulse: (!) 107  SpO2: 98%   HEENT: Sclera anicteric. Dentition is moderate. Appears well hydrated. Neck: Supple Cardiac: Regular rate and rhythm. Normal S1/S2. No murmurs, rubs, or gallops appreciated. Lungs: Clear bilaterally to ascultation.  Abdomen: Normoactive bowel sounds. No tenderness to deep or light palpation. No rebound or guarding.  Extremities: Warm, well perfused without edema.  Skin: Warm, dry Psych: Pleasant and appropriate   Hypertension Increase to 25 mg, will plan to add amlodipine if continues to increase. BMP today, follow up in January.   Low back  pain Mild, discussed stretching, exercise, gave NSAID gel given hypertension. Discussed reasons to return including worsening, changes in sensation. No midline tenderness or trauma, will hold off on imaging for now, monitor. Return in January.   Overweight BMI 26. Discussed reducing Pepsi and other interventions. Patient agreeable---will try to reduce to a few per week.     Return to care in January for BP and back pain   Dorris Singh, MD  [facility] Medicine Teaching Service

## 2019-04-02 NOTE — Patient Instructions (Signed)
For your back  - You can take ibuprofen 1-2 times per week - Tylenol is best--- safe for the kidneys and blood pressure - Stretches--2-3 repetitions each day - Let me know if it gets worse  Increase HCTZ to 25 mg (two tablets of the 12.5). I sent in a new prescription for the increased dose.   Try to cut back on your Pepsi  Follow up in January  I will call you with labs  It was wonderful to see you today.  Thank you for choosing [facility].   Please call [phone] with any questions about today's appointment.  Please be sure to schedule follow up at the front  desk before you leave today.   Dorris Singh, MD  Family Medicine

## 2019-04-02 NOTE — Assessment & Plan Note (Signed)
Mild, discussed stretching, exercise, gave NSAID gel given hypertension. Discussed reasons to return including worsening, changes in sensation. No midline tenderness or trauma, will hold off on imaging for now, monitor. Return in January.

## 2019-04-02 NOTE — Assessment & Plan Note (Signed)
Increase to 25 mg, will plan to add amlodipine if continues to increase. BMP today, follow up in January.

## 2019-04-03 LAB — BASIC METABOLIC PANEL
BUN/Creatinine Ratio: 17 (ref 9–23)
BUN: 18 mg/dL (ref 6–24)
CO2: 22 mmol/L (ref 20–29)
Calcium: 9.5 mg/dL (ref 8.7–10.2)
Chloride: 103 mmol/L (ref 96–106)
Creatinine, Ser: 1.08 mg/dL — ABNORMAL HIGH (ref 0.57–1.00)
GFR calc Af Amer: 66 mL/min/{1.73_m2} (ref >59)
GFR calc non Af Amer: 57 mL/min/{1.73_m2} — ABNORMAL LOW (ref >59)
Glucose: 125 mg/dL — ABNORMAL HIGH (ref 65–99)
Potassium: 3.6 mmol/L (ref 3.5–5.2)
Sodium: 141 mmol/L (ref 134–144)

## 2019-04-03 LAB — LIPID PANEL
Chol/HDL Ratio: 4.4 ratio (ref 0.0–4.4)
Cholesterol, Total: 221 mg/dL — ABNORMAL HIGH (ref 100–199)
HDL: 50 mg/dL (ref >39)
LDL Chol Calc (NIH): 144 mg/dL — ABNORMAL HIGH (ref 0–99)
Triglycerides: 152 mg/dL — ABNORMAL HIGH (ref 0–149)
VLDL Cholesterol Cal: 27 mg/dL (ref 5–40)

## 2019-04-04 ENCOUNTER — Telehealth: Payer: Self-pay | Admitting: *Deleted

## 2019-04-04 ENCOUNTER — Telehealth: Payer: Self-pay | Admitting: Family Medicine

## 2019-04-04 NOTE — Telephone Encounter (Signed)
Received fax from pharmacy, PA needed on Voltaren Gel.  Clinical questions submitted via Cover My Meds.  Waiting on response, could take up to 72 hours.  Cover My Meds info: Key: [id number]  Christen Bame, CMA

## 2019-04-04 NOTE — Telephone Encounter (Signed)
Called patient with results. Creatinine minimally elevated, repeat at follow up. Cholesterol elevated.   The 10-year ASCVD risk score Mikey Bussing DC [name]., et al., 2013) is: 13.4%   Values used to calculate the score:     Age: 58 years     Sex: Female     Is Non-Hispanic African American: Yes     Diabetic: No     Tobacco smoker: No     Systolic Blood Pressure: 0000000 mmHg     Is BP treated: Yes     HDL Cholesterol: 50 mg/dL     Total Cholesterol: 221 mg/dL  Discussed starting statin. Patient prefers to try dietary changes.  - Repeat lipids at follow up (LDL) and BMP.   Patient reports intermittent characteristic reflux symptoms (with spicy fat foods). Discussed, recommended PRN Tums for now. Follow up, discuss.   Dorris Singh, MD  Family Medicine Teaching Service

## 2019-04-09 NOTE — Telephone Encounter (Signed)
Rx denied. See below.  

## 2019-04-26 ENCOUNTER — Other Ambulatory Visit: Payer: Self-pay

## 2019-04-26 ENCOUNTER — Ambulatory Visit
Admission: RE | Admit: 2019-04-26 | Discharge: 2019-04-26 | Disposition: A | Discharge status: Home / Self Care | Payer: BC Managed Care – PPO | Source: Ambulatory Visit | Attending: Family Medicine | Admitting: Family Medicine

## 2019-04-26 DIAGNOSIS — Z1231 Encounter for screening mammogram for malignant neoplasm of breast: Secondary | ICD-10-CM

## 2019-04-27 ENCOUNTER — Encounter: Payer: Self-pay | Admitting: Family Medicine

## 2019-04-29 NOTE — Progress Notes (Signed)
Subjective  [name] is a 59 y.o. female is presenting with the following: blood pressure   Patient was seen several weeks ago for BP, increased dose of HCTZ. She reports compliance. NO chest pain, dyspnea, vision changes, no edema.   She is very pleased she has lost 4 pounds--cut out sodas.  24 hour diet review: Breakfast: bacon, grits, eggs Snack: cookie or power donut at work Lunch: Research scientist (medical)) Dinner: meatloaf or something similar Beverages 1 ginger ale, rest is water   She is RHD. Has a prior injury with surgery on first MCP.  Objective Vital Signs reviewed BP (!) 144/88   Pulse 77   Wt 167 lb (75.8 kg)   LMP 04/12/2004   SpO2 99%   BMI 26.16 kg/m    HEENT: Sclera anicteric. Dentition is moderate. Appears well hydrated. Neck: Supple Cardiac: Regular rate and rhythm. Normal S1/S2. No murmurs, rubs, or gallops appreciated. Lungs: Clear bilaterally to ascultation.  Extremities: Warm, well perfused without edema.  Skin: Warm, dry Psych: Pleasant and appropriate     Assessments/Plans  Hypertension Near goal today, congratulated, repeat BMET today.   Overweight Congratulated on weight loss. Encouraged continued dietary change, increase activity.   Dyslipidemia, The 10-year ASCVD risk score Mikey Bussing DC [name]., et al., 2013) is: 9.6%   Values used to calculate the score:     Age: 16 years     Sex: Female     Is Non-Hispanic African American: Yes     Diabetic: No     Tobacco smoker: No     Systolic Blood Pressure: 123456 mmHg     Is BP treated: Yes     HDL Cholesterol: 50 mg/dL     Total Cholesterol: 221 mg/dL  Discussed via phone and again today. Will repeat direct LDL given improvement in BP. She is in intermediate risk category, consider statin in future. Will need yearly lipids.    See after visit summary for details of patient instructions  Dorris Singh, MD  [facility] Medicine Teaching Service

## 2019-04-30 ENCOUNTER — Other Ambulatory Visit: Payer: Self-pay

## 2019-04-30 ENCOUNTER — Encounter: Payer: Self-pay | Admitting: Family Medicine

## 2019-04-30 ENCOUNTER — Ambulatory Visit (INDEPENDENT_AMBULATORY_CARE_PROVIDER_SITE_OTHER): Payer: BC Managed Care – PPO | Admitting: Family Medicine

## 2019-04-30 ENCOUNTER — Telehealth: Payer: Self-pay | Admitting: *Deleted

## 2019-04-30 VITALS — BP 144/88 | HR 77 | Wt 167.0 lb

## 2019-04-30 DIAGNOSIS — E663 Overweight: Secondary | ICD-10-CM | POA: Diagnosis not present

## 2019-04-30 DIAGNOSIS — I10 Essential (primary) hypertension: Secondary | ICD-10-CM

## 2019-04-30 DIAGNOSIS — M19041 Primary osteoarthritis, right hand: Secondary | ICD-10-CM | POA: Diagnosis not present

## 2019-04-30 LAB — POCT GLYCOSYLATED HEMOGLOBIN (HGB A1C): Hemoglobin A1C: 6.2 % — AB (ref 4.0–5.6)

## 2019-04-30 MED ORDER — DICLOFENAC SODIUM 1 % EX GEL: 4.0000 g | Freq: Four times a day (QID) | CUTANEOUS | 3 refills | Status: DC

## 2019-04-30 NOTE — Assessment & Plan Note (Signed)
Congratulated on weight loss. Encouraged continued dietary change, increase activity.   Dyslipidemia, The 10-year ASCVD risk score [name]., et al., 2013) is: 9.6%   Values used to calculate the score:     Age: 59 years     Sex: Female     Is Non-Hispanic African American: Yes     Diabetic: No     Tobacco smoker: No     Systolic Blood Pressure: 123456 mmHg     Is BP treated: Yes     HDL Cholesterol: 50 mg/dL     Total Cholesterol: 221 mg/dL  Discussed via phone and again today. Will repeat direct LDL given improvement in BP. She is in intermediate risk category, consider statin in future. Will need yearly lipids.

## 2019-04-30 NOTE — Telephone Encounter (Signed)
Received fax from pharmacy, PA needed on diclofenac gel.  Clinical questions submitted via Cover My Meds.  Waiting on response, could take up to 72 hours.  Cover My Meds info: Key: [place], CMA

## 2019-04-30 NOTE — Assessment & Plan Note (Signed)
Near goal today, congratulated, repeat BMET today.

## 2019-04-30 NOTE — Patient Instructions (Signed)
Your blood pressure goal is < 140/90.   Great job working on your health!   Today, we set a goal of 160 pounds in 3 months  Please make follow up with me in 3 months---schedule this at the front  [place] Job with weight loss---- I will call you with labs on Wednesday  Please go to the lab today    Some tips for general wellness: - Set a goal of 150 minutes (30 minutes five times per week) of moderate exercise---this means you should get your heart rate up for about 30 minutes each weekday. Try walking around the block twice, doing stretches with your family, or even dancing.  - Limit alcohol to 2 or fewer drinks per day for men and 1 or fewer drinks per day for women. - Limit sodium to 2,300 mg per day (this can add up quickly!).  - Increase leafy, green vegetables and fresh fruits--strive for five servings per day  - Limit sugar sweetened beverages include juice and soda--- water is best!   It was wonderful to see you today.  Thank you for choosing [facility].   Please call [phone] with any questions about today's appointment.   Dorris Singh, MD  Family Medicine

## 2019-05-01 ENCOUNTER — Telehealth: Payer: Self-pay | Admitting: Family Medicine

## 2019-05-01 DIAGNOSIS — R1013 Epigastric pain: Secondary | ICD-10-CM

## 2019-05-01 LAB — BASIC METABOLIC PANEL
BUN/Creatinine Ratio: 16 (ref 9–23)
BUN: 13 mg/dL (ref 6–24)
CO2: 25 mmol/L (ref 20–29)
Calcium: 9.9 mg/dL (ref 8.7–10.2)
Chloride: 98 mmol/L (ref 96–106)
Creatinine, Ser: 0.79 mg/dL (ref 0.57–1.00)
GFR calc Af Amer: 96 mL/min/{1.73_m2} (ref >59)
GFR calc non Af Amer: 83 mL/min/{1.73_m2} (ref >59)
Glucose: 96 mg/dL (ref 65–99)
Potassium: 4 mmol/L (ref 3.5–5.2)
Sodium: 138 mmol/L (ref 134–144)

## 2019-05-01 LAB — LDL CHOLESTEROL, DIRECT: LDL Direct: 157 mg/dL — ABNORMAL HIGH (ref 0–99)

## 2019-05-01 MED ORDER — FAMOTIDINE 20 MG PO TABS: 20.0000 mg | ORAL_TABLET | Freq: Two times a day (BID) | ORAL | 3 refills | Status: DC

## 2019-05-01 NOTE — Telephone Encounter (Signed)
Approved 04/30/2019 - 04/29/2022.  [place]. Christen Bame, CMA

## 2019-05-01 NOTE — Telephone Encounter (Signed)
Called patient with results. Will repeat LDL in spring.  The 10-year ASCVD risk score Christina Bussing DC [name]., et al., 2013) is: 9.6%   Values used to calculate the score:     Age: 59 years     Sex: Female     Is Non-Hispanic African American: Yes     Diabetic: No     Tobacco smoker: No     Systolic Blood Pressure: 123456 mmHg     Is BP treated: Yes     HDL Cholesterol: 50 mg/dL     Total Cholesterol: 221 mg/dL Given weight loss, smoking cessation, will not start statin yet.   Patient also reports ongoing reflux symptoms. Only happens 1-2 times per week when laying down, feels like she needs to belch. No chest pain, unintentional weight loss, change in stools. Has history of reflux. Rx for PRN h2 blocker.  Christina Singh, MD  Family Medicine Teaching Service

## 2019-06-20 ENCOUNTER — Encounter: Admit: 2019-06-20 | Payer: PRIVATE HEALTH INSURANCE | Attending: Cardiovascular Disease | Primary: Internal Medicine

## 2019-06-20 ENCOUNTER — Encounter: Admit: 2019-06-20 | Payer: PRIVATE HEALTH INSURANCE | Primary: Internal Medicine

## 2019-06-20 DIAGNOSIS — Z95810 Presence of automatic (implantable) cardiac defibrillator: Secondary | ICD-10-CM

## 2019-07-23 ENCOUNTER — Other Ambulatory Visit: Payer: Self-pay

## 2019-07-23 ENCOUNTER — Encounter: Payer: Self-pay | Admitting: Family Medicine

## 2019-07-23 ENCOUNTER — Ambulatory Visit (INDEPENDENT_AMBULATORY_CARE_PROVIDER_SITE_OTHER): Payer: BC Managed Care – PPO | Admitting: Family Medicine

## 2019-07-23 VITALS — BP 140/82 | HR 91 | Ht 68.82 in | Wt 165.0 lb

## 2019-07-23 DIAGNOSIS — E663 Overweight: Secondary | ICD-10-CM

## 2019-07-23 DIAGNOSIS — E7800 Pure hypercholesterolemia, unspecified: Secondary | ICD-10-CM

## 2019-07-23 DIAGNOSIS — I10 Essential (primary) hypertension: Secondary | ICD-10-CM

## 2019-07-23 DIAGNOSIS — R1013 Epigastric pain: Secondary | ICD-10-CM

## 2019-07-23 DIAGNOSIS — M19041 Primary osteoarthritis, right hand: Secondary | ICD-10-CM | POA: Diagnosis not present

## 2019-07-23 DIAGNOSIS — M545 Low back pain: Secondary | ICD-10-CM

## 2019-07-23 DIAGNOSIS — G8929 Other chronic pain: Secondary | ICD-10-CM

## 2019-07-23 DIAGNOSIS — B353 Tinea pedis: Secondary | ICD-10-CM

## 2019-07-23 MED ORDER — DICLOFENAC SODIUM 1 % EX GEL: 4.0000 g | Freq: Four times a day (QID) | CUTANEOUS | 3 refills | Status: DC

## 2019-07-23 MED ORDER — FAMOTIDINE 20 MG PO TABS: 20.0000 mg | ORAL_TABLET | Freq: Two times a day (BID) | ORAL | 3 refills | Status: DC

## 2019-07-23 MED ORDER — KETOCONAZOLE 2 % EX CREA: 1.0000 "application " | TOPICAL_CREAM | Freq: Every day | CUTANEOUS | 2 refills | Status: DC

## 2019-07-23 MED ORDER — FLUTICASONE PROPIONATE 50 MCG/ACT NA SUSP: 2.0000 | Freq: Every day | NASAL | 6 refills | Status: DC

## 2019-07-23 NOTE — Assessment & Plan Note (Signed)
Discussed at end of visit---asked to return if worsens or does not improve. If still present at follow up, imaging with xray. No radiculopathy, nocturnal pain, fevers, undesired weight loss.

## 2019-07-23 NOTE — Progress Notes (Addendum)
    SUBJECTIVE:   CHIEF COMPLAINT / HPI:   [name] is a pleasant 59 year old woman with history significant for essential hypertension, tobacco abuse now resolved and prediabetes presenting today for follow-up.  She reports overall she is doing well.  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  The patient continues to endorse some mild low back pain that is slowly improving.  The patient recently had worsening of her reflux symptoms.  She has not yet started the famotidine due to cost.  She reports she is always had heartburn.  This is no worse than usual.  She denies melena, hematochezia dysphagia or odynphagia .  She has been trying to lose weight.  The patient is taking HCTZ 25 mg. She feels this is working well.   Patient reports an itchy rash between 2 of her toes.  She has tried nothing for this.  This is only on her left foot.  She also has some discomfort over the area. PERTINENT  PMH / PSH/Family/Social History : Reviewed and updated   OBJECTIVE:   BP 140/82   Pulse 91   Ht 5' 8.82" (1.748 m)   Wt 165 lb (74.8 kg)   LMP 04/12/2004   SpO2 98%   BMI 24.49 kg/m   Today's Vitals   07/23/19 1000  BP: 140/82  Pulse: 91  SpO2: 98%  Weight: 165 lb (74.8 kg)  Height: 5' 8.82" (1.748 m)  PainSc: 0-No pain   Body mass index is 24.49 kg/m. HEENT: Sclera anicteric. Dentition is moderate. Appears well hydrated. Neck: Supple Cardiac: Regular rate and rhythm. Normal S1/S2. No murmurs, rubs, or gallops appreciated. Lungs: Clear bilaterally to ascultation.  Skin: Warm, dry, left foot with hyperpigmented scaling rash between toes 1/2 and 2/3  Psych: Pleasant and appropriate     ASSESSMENT/PLAN:   Hypertension Repeat BMP today. Continue medications, at goal.   Low back pain Discussed at end of visit---asked to return if worsens or does not improve. If still present at follow up, imaging with xray. No radiculopathy, nocturnal pain,  fevers, undesired weight loss.    Tinea pedis differential includes atopic disease, less likely PAD. Rx for antifungal, discussed OTC as well.  Dyspepsia, H2 blocker prescribed. This has been longstanding. If has uncontrolled symptoms will refer to GI for consideration of endoscopy.   Dorris Singh, MD  Family Medicine Teaching Service  [place]   A1C at follow up

## 2019-07-23 NOTE — Patient Instructions (Addendum)
You can use over the counter Lamisil for your left foot OR the cream I sent to your pharmacy  Use Flonase twice daily in each nostril for your allergies  We might do a sleep study for sleep apnea in the future.   It was wonderful to see you today.  Please bring ALL of your medications with you to every visit.   Thank you for choosing [facility].   Please call [phone] with any questions about today's appointment.  Please be sure to schedule follow up at the front  desk before you leave today.   Dorris Singh, MD  Family Medicine    It was great to see you---great work on eating healthy!!

## 2019-07-23 NOTE — Assessment & Plan Note (Signed)
Repeat BMP today. Continue medications, at goal.

## 2019-07-24 LAB — HEMOGLOBIN A1C
Est. average glucose Bld gHb Est-mCnc: 137 mg/dL
Hgb A1c MFr Bld: 6.4 % — ABNORMAL HIGH (ref 4.8–5.6)

## 2019-07-24 LAB — LDL CHOLESTEROL, DIRECT: LDL Direct: 139 mg/dL — ABNORMAL HIGH (ref 0–99)

## 2019-07-25 ENCOUNTER — Telehealth: Payer: Self-pay | Admitting: Family Medicine

## 2019-07-25 ENCOUNTER — Encounter: Payer: Self-pay | Admitting: Family Medicine

## 2019-07-25 NOTE — Telephone Encounter (Signed)
Called with results--repeat around July/August. Will send letter with diabetes prevention programs.   Dorris Singh, MD  Family Medicine Teaching Service

## 2019-08-22 ENCOUNTER — Encounter: Admit: 2019-08-22 | Payer: PRIVATE HEALTH INSURANCE | Attending: Cardiovascular Disease | Primary: Internal Medicine

## 2019-08-22 ENCOUNTER — Encounter: Admit: 2019-08-22 | Payer: MEDICARE | Attending: Cardiovascular Disease | Primary: Internal Medicine

## 2019-08-22 DIAGNOSIS — I5022 Chronic systolic (congestive) heart failure: Secondary | ICD-10-CM

## 2019-08-22 DIAGNOSIS — M869 Osteomyelitis, unspecified: Secondary | ICD-10-CM

## 2019-08-22 DIAGNOSIS — Z95810 Presence of automatic (implantable) cardiac defibrillator: Secondary | ICD-10-CM

## 2019-08-22 DIAGNOSIS — A419 Sepsis, unspecified organism: Secondary | ICD-10-CM

## 2019-08-22 DIAGNOSIS — G8250 Quadriplegia, unspecified: Secondary | ICD-10-CM

## 2019-08-22 DIAGNOSIS — I10 Essential (primary) hypertension: Secondary | ICD-10-CM

## 2019-08-22 DIAGNOSIS — Z973 Presence of spectacles and contact lenses: Secondary | ICD-10-CM

## 2019-08-22 DIAGNOSIS — Z789 Other specified health status: Secondary | ICD-10-CM

## 2019-08-22 DIAGNOSIS — L8990 Pressure ulcer of unspecified site, unspecified stage: Secondary | ICD-10-CM

## 2019-08-22 DIAGNOSIS — I472 VT (ventricular tachycardia) (HC Code): Secondary | ICD-10-CM

## 2019-08-22 DIAGNOSIS — K219 Gastro-esophageal reflux disease without esophagitis: Secondary | ICD-10-CM

## 2019-08-22 DIAGNOSIS — M009 Pyogenic arthritis, unspecified: Secondary | ICD-10-CM

## 2019-08-22 DIAGNOSIS — L409 Psoriasis, unspecified: Secondary | ICD-10-CM

## 2019-08-22 DIAGNOSIS — Z993 Dependence on wheelchair: Secondary | ICD-10-CM

## 2019-08-22 DIAGNOSIS — I2510 Atherosclerotic heart disease of native coronary artery without angina pectoris: Secondary | ICD-10-CM

## 2019-08-22 DIAGNOSIS — S14107A Unspecified injury at C7 level of cervical spinal cord, initial encounter: Secondary | ICD-10-CM

## 2019-08-22 DIAGNOSIS — I219 Acute myocardial infarction, unspecified: Secondary | ICD-10-CM

## 2019-08-22 DIAGNOSIS — E785 Hyperlipidemia, unspecified: Secondary | ICD-10-CM

## 2019-08-22 DIAGNOSIS — I509 Heart failure, unspecified: Secondary | ICD-10-CM

## 2019-08-22 DIAGNOSIS — I255 Ischemic cardiomyopathy: Secondary | ICD-10-CM

## 2019-08-22 DIAGNOSIS — I499 Cardiac arrhythmia, unspecified: Secondary | ICD-10-CM

## 2019-08-22 DIAGNOSIS — E7800 Pure hypercholesterolemia, unspecified: Secondary | ICD-10-CM

## 2019-08-22 DIAGNOSIS — K592 Neurogenic bowel, not elsewhere classified: Secondary | ICD-10-CM

## 2019-08-22 DIAGNOSIS — N390 Urinary tract infection, site not specified: Secondary | ICD-10-CM

## 2019-08-22 DIAGNOSIS — E119 Type 2 diabetes mellitus without complications: Secondary | ICD-10-CM

## 2019-08-22 DIAGNOSIS — N200 Calculus of kidney: Secondary | ICD-10-CM

## 2019-08-22 DIAGNOSIS — N319 Neuromuscular dysfunction of bladder, unspecified: Secondary | ICD-10-CM

## 2019-08-22 MED ORDER — OXYCODONE-ACETAMINOPHEN 5 MG-325 MG TABLET
5-325 mg | Status: AC
Start: 2019-08-22 — End: ?

## 2019-08-22 MED ORDER — AMOXICILLIN 500 MG CAPSULE
500 mg | Status: AC
Start: 2019-08-22 — End: ?
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
## 2019-09-04 ENCOUNTER — Encounter: Admit: 2019-09-04 | Payer: PRIVATE HEALTH INSURANCE | Attending: Cardiovascular Disease | Primary: Internal Medicine

## 2019-09-18 ENCOUNTER — Encounter: Admit: 2019-09-18 | Payer: PRIVATE HEALTH INSURANCE | Attending: Cardiovascular Disease | Primary: Internal Medicine

## 2019-09-18 ENCOUNTER — Encounter: Admit: 2019-09-18 | Payer: PRIVATE HEALTH INSURANCE | Primary: Internal Medicine

## 2019-09-18 DIAGNOSIS — Z95810 Presence of automatic (implantable) cardiac defibrillator: Secondary | ICD-10-CM

## 2019-11-13 ENCOUNTER — Encounter: Payer: Self-pay | Admitting: *Deleted

## 2019-11-13 ENCOUNTER — Telehealth: Payer: Self-pay | Admitting: *Deleted

## 2019-11-13 NOTE — Telephone Encounter (Signed)
-----   Message from Martyn Malay, MD sent at 11/11/2019  6:23 PM EDT ----- Regarding: Follow up Hi Red Team,  Please schedule this patient for follow up with me to check in on her BP and cholesterol (and prediabetes) in the next 1-2 months.  Thanks, Dorris Singh, MD  [facility] Medicine Teaching Service

## 2019-11-13 NOTE — Telephone Encounter (Signed)
Sent pt a Therapist, music. Carlon Chaloux Kennon Holter, CMA

## 2019-12-18 ENCOUNTER — Telehealth
Admit: 2019-12-18 | Payer: PRIVATE HEALTH INSURANCE | Attending: Vascular and Interventional Radiology | Primary: Internal Medicine

## 2019-12-18 ENCOUNTER — Encounter: Admit: 2019-12-18 | Payer: PRIVATE HEALTH INSURANCE | Attending: Cardiovascular Disease | Primary: Internal Medicine

## 2019-12-18 ENCOUNTER — Encounter: Admit: 2019-12-18 | Payer: PRIVATE HEALTH INSURANCE | Primary: Internal Medicine

## 2019-12-18 DIAGNOSIS — Z95810 Presence of automatic (implantable) cardiac defibrillator: Secondary | ICD-10-CM

## 2019-12-18 NOTE — Telephone Encounter
Presenting EGM VS. VP 1.26%.20 AF episodes since 7/8 - longest lasting 18 minutes. VVI Optival stable.Not on OAC. On Coreg. Next OV 03/12/2020.Spoke with patient. Patient only c/o insomnia. She stated it is due to her mattress. Not sure if she has OSA?

## 2019-12-19 ENCOUNTER — Telehealth: Admit: 2019-12-19 | Payer: PRIVATE HEALTH INSURANCE | Attending: Cardiovascular Disease | Primary: Internal Medicine

## 2019-12-19 NOTE — Telephone Encounter
Please call to discuss the episodes that she's been experiencing.

## 2019-12-19 NOTE — Telephone Encounter
Returned patient call.  Patient wanted to review discussion we had yesterday regarding transmission.  Reviewed AF alerts and confirmed patient is asymptomatic.

## 2020-02-11 ENCOUNTER — Other Ambulatory Visit: Payer: Self-pay

## 2020-02-11 ENCOUNTER — Ambulatory Visit
Admission: RE | Admit: 2020-02-11 | Discharge: 2020-02-11 | Disposition: A | Discharge status: Home / Self Care | Payer: BC Managed Care – PPO | Source: Ambulatory Visit | Attending: Family Medicine | Admitting: Family Medicine

## 2020-02-11 ENCOUNTER — Ambulatory Visit: Payer: BC Managed Care – PPO | Admitting: Family Medicine

## 2020-02-11 ENCOUNTER — Encounter: Payer: Self-pay | Admitting: Family Medicine

## 2020-02-11 VITALS — BP 172/88 | HR 80 | Ht 68.0 in | Wt 165.6 lb

## 2020-02-11 DIAGNOSIS — M25561 Pain in right knee: Secondary | ICD-10-CM

## 2020-02-11 DIAGNOSIS — I10 Essential (primary) hypertension: Secondary | ICD-10-CM

## 2020-02-11 DIAGNOSIS — E663 Overweight: Secondary | ICD-10-CM

## 2020-02-11 DIAGNOSIS — M545 Low back pain, unspecified: Secondary | ICD-10-CM

## 2020-02-11 DIAGNOSIS — G8929 Other chronic pain: Secondary | ICD-10-CM

## 2020-02-11 DIAGNOSIS — Z23 Encounter for immunization: Secondary | ICD-10-CM | POA: Diagnosis not present

## 2020-02-11 LAB — POCT GLYCOSYLATED HEMOGLOBIN (HGB A1C): Hemoglobin A1C: 6.3 % — AB (ref 4.0–5.6)

## 2020-02-11 MED ORDER — AMLODIPINE BESYLATE 5 MG PO TABS: 5.0000 mg | ORAL_TABLET | Freq: Every day | ORAL | 3 refills | Status: DC

## 2020-02-11 NOTE — Assessment & Plan Note (Signed)
Not at goal only taking HCTZ right now.  We discussed the addition of amlodipine.  Medication prescribed.  Discussed common side effects.  Follow-up in 1 month for blood pressure check.

## 2020-02-11 NOTE — Assessment & Plan Note (Signed)
Chronic problem ongoing not improved and actually worsened.  Requiring imaging today.  Most likely this represents degenerative disc disease.  She does have a small component of sciatica.  She has no bowel or bladder dysfunction or signs of cauda equina.  I do not suspect severe spinal stenosis. - X-rays of lumbar spine - Tylenol for pain - Pending imaging -> PT vs. Orthopedics

## 2020-02-11 NOTE — Patient Instructions (Addendum)
It was wonderful to see you today.  Please bring ALL of your medications with you to every visit.   Today we talked about:  - Getting X-rays at the [facility]  - I will call you with results and next steps  - Going to the lab to get blood work  - Follow up in one month    Thank you for choosing [facility].   Please call [phone] with any questions about today's appointment.  Please be sure to schedule follow up at the front  desk before you leave today.   Dorris Singh, MD  Family Medicine

## 2020-02-11 NOTE — Progress Notes (Signed)
SUBJECTIVE:   CHIEF COMPLAINT / HPI:   [name] is a very pleasant 59 year old woman with history significant for hypertension and prediabetes presenting today for routine follow-up.  Her primary complaint is entire right-sided body pain.  The patient reports that her right side of her back and her right knee are very painful.  In terms of her back pain she has had this on and off for the past few months.  She reports this has not improved.  At the end of the day at work (she works in [employer] of the [employer]) her back pain is quite severe.  At times it radiates down her leg.  She has no numbness or weakness.  She denies falls.  She does have a history of injury in the 1990s from a work-related incident but she is does not think this is similar nature.  Tylenol improves her symptoms to some degree.  The patient reports new complaint of right knee pain.  This is relatively acute medially located along the right knee.  She has some numbness in her right foot intermittently which she thinks may be related to the knee pain but is uncertain.  She reports the pain is achy worse at the very end of the day.  No locking, clicking, catching, popping falls or perceived weakness.  She has tried Voltaren gel for this without success.  The patient reports compliance with her HCTZ.  She denies headaches, chest pain, vision changes today.  She reports her took blood pressure pill.   PERTINENT  PMH / PSH/Family/Social History : Reviewed and updated as appropriate.  OBJECTIVE:   BP (!) 172/88   Pulse 80   Ht 5\' 8"  (1.727 m)   Wt 165 lb 9.6 oz (75.1 kg)   LMP 04/12/2004   SpO2 99%   BMI 25.18 kg/m   Today's weight:  Last Weight  Most recent update: 02/11/2020 10:32 AM   Weight  75.1 kg (165 lb 9.6 oz)           Review of prior weights: Filed Weights   02/11/20 1032  Weight: 165 lb 9.6 oz (75.1 kg)   HEENT: Sclera anicteric. Dentition is moderate. Appears well  hydrated. Neck: Supple Cardiac: Regular rate and rhythm. Normal S1/S2. No murmurs, rubs, or gallops appreciated. Lungs: Clear bilaterally to ascultation.  Back exam.  Normal appearing.  No rashes or scars.  Full forward flexion and extension.  Mild tenderness to palpation really along bilateral paraspinal muscles.  Preserved strength and sensation of lower extremities.  Knee exam is notable for mild small effusion of the right knee with medial joint line tenderness.  There is no crepitus clicking locking or catching.  Ligamentous laxity test all negative.   ASSESSMENT/PLAN:   Low back pain Chronic problem ongoing not improved and actually worsened.  Requiring imaging today.  Most likely this represents degenerative disc disease.  She does have a small component of sciatica.  She has no bowel or bladder dysfunction or signs of cauda equina.  I do not suspect severe spinal stenosis. - X-rays of lumbar spine - Tylenol for pain - Pending imaging -> PT vs. Orthopedics   Hypertension Not at goal only taking HCTZ right now.  We discussed the addition of amlodipine.  Medication prescribed.  Discussed common side effects.  Follow-up in 1 month for blood pressure check.   Prediabetes- still doing well, repeat 6 months to 1 year.   Knee pain-suspect this is osteoarthritis, differential  does include meniscal pathology.  Less likely but also considered internal derangement of the knee such as ligamentous injury although her ligaments are intact on my examination.  Will obtain standing x-rays today.  Encouraged her to use Tylenol for pain.  May consider referral to physical therapy versus orthopedic referral versus steroid injection.  Healthcare maintenance she is received a Covid vaccine.  Flu shot given today.  Colonoscopy was in 2019.  Discuss LOW DOSE CT at followup.   [name], [place]

## 2020-02-12 LAB — BASIC METABOLIC PANEL
BUN/Creatinine Ratio: 22 (ref 9–23)
BUN: 17 mg/dL (ref 6–24)
CO2: 26 mmol/L (ref 20–29)
Calcium: 10.1 mg/dL (ref 8.7–10.2)
Chloride: 98 mmol/L (ref 96–106)
Creatinine, Ser: 0.78 mg/dL (ref 0.57–1.00)
GFR calc Af Amer: 97 mL/min/{1.73_m2} (ref >59)
GFR calc non Af Amer: 84 mL/min/{1.73_m2} (ref >59)
Glucose: 110 mg/dL — ABNORMAL HIGH (ref 65–99)
Potassium: 3.9 mmol/L (ref 3.5–5.2)
Sodium: 139 mmol/L (ref 134–144)

## 2020-02-13 ENCOUNTER — Telehealth: Payer: Self-pay | Admitting: Family Medicine

## 2020-02-13 NOTE — Telephone Encounter (Signed)
Called with results. Discussed starting statin. Patient would like to discuss at follow up.  -At follow up, discuss statin.   Dorris Singh, MD  Family Medicine Teaching Service

## 2020-02-27 ENCOUNTER — Encounter: Admit: 2020-02-27 | Payer: MEDICARE | Attending: Cardiovascular Disease | Primary: Internal Medicine

## 2020-02-27 ENCOUNTER — Encounter: Admit: 2020-02-27 | Payer: PRIVATE HEALTH INSURANCE | Attending: Cardiovascular Disease | Primary: Internal Medicine

## 2020-02-27 DIAGNOSIS — K219 Gastro-esophageal reflux disease without esophagitis: Secondary | ICD-10-CM

## 2020-02-27 DIAGNOSIS — I10 Essential (primary) hypertension: Secondary | ICD-10-CM

## 2020-02-27 DIAGNOSIS — Z95810 Presence of automatic (implantable) cardiac defibrillator: Secondary | ICD-10-CM

## 2020-02-27 DIAGNOSIS — E119 Type 2 diabetes mellitus without complications: Secondary | ICD-10-CM

## 2020-02-27 DIAGNOSIS — Z4502 Encounter for adjustment and management of automatic implantable cardiac defibrillator: Secondary | ICD-10-CM

## 2020-02-27 DIAGNOSIS — I255 Ischemic cardiomyopathy: Secondary | ICD-10-CM

## 2020-02-27 DIAGNOSIS — G8250 Quadriplegia, unspecified: Secondary | ICD-10-CM

## 2020-02-27 DIAGNOSIS — I5022 Chronic systolic (congestive) heart failure: Secondary | ICD-10-CM

## 2020-02-27 DIAGNOSIS — I219 Acute myocardial infarction, unspecified: Secondary | ICD-10-CM

## 2020-02-27 DIAGNOSIS — A419 Sepsis, unspecified organism: Secondary | ICD-10-CM

## 2020-02-27 DIAGNOSIS — I472 VT (ventricular tachycardia) (HC Code): Secondary | ICD-10-CM

## 2020-02-27 DIAGNOSIS — S14107A Unspecified injury at C7 level of cervical spinal cord, initial encounter: Secondary | ICD-10-CM

## 2020-02-27 DIAGNOSIS — M869 Osteomyelitis, unspecified: Secondary | ICD-10-CM

## 2020-02-27 DIAGNOSIS — T82198S Other mechanical complication of other cardiac electronic device, sequela: Secondary | ICD-10-CM

## 2020-02-27 DIAGNOSIS — I493 Ventricular premature depolarization: Secondary | ICD-10-CM

## 2020-02-27 DIAGNOSIS — N200 Calculus of kidney: Secondary | ICD-10-CM

## 2020-02-27 DIAGNOSIS — I509 Heart failure, unspecified: Secondary | ICD-10-CM

## 2020-02-27 DIAGNOSIS — N390 Urinary tract infection, site not specified: Secondary | ICD-10-CM

## 2020-02-27 DIAGNOSIS — N319 Neuromuscular dysfunction of bladder, unspecified: Secondary | ICD-10-CM

## 2020-02-27 DIAGNOSIS — E7800 Pure hypercholesterolemia, unspecified: Secondary | ICD-10-CM

## 2020-02-27 DIAGNOSIS — L409 Psoriasis, unspecified: Secondary | ICD-10-CM

## 2020-02-27 DIAGNOSIS — L8990 Pressure ulcer of unspecified site, unspecified stage: Secondary | ICD-10-CM

## 2020-02-27 DIAGNOSIS — I2510 Atherosclerotic heart disease of native coronary artery without angina pectoris: Secondary | ICD-10-CM

## 2020-02-27 DIAGNOSIS — Z993 Dependence on wheelchair: Secondary | ICD-10-CM

## 2020-02-27 DIAGNOSIS — K592 Neurogenic bowel, not elsewhere classified: Secondary | ICD-10-CM

## 2020-02-27 DIAGNOSIS — M009 Pyogenic arthritis, unspecified: Secondary | ICD-10-CM

## 2020-02-27 DIAGNOSIS — Z973 Presence of spectacles and contact lenses: Secondary | ICD-10-CM

## 2020-02-27 DIAGNOSIS — E785 Hyperlipidemia, unspecified: Secondary | ICD-10-CM

## 2020-02-27 DIAGNOSIS — I499 Cardiac arrhythmia, unspecified: Secondary | ICD-10-CM

## 2020-02-27 DIAGNOSIS — Z789 Other specified health status: Secondary | ICD-10-CM

## 2020-02-27 MED ORDER — NOVOLOG FLEXPEN U-100 INSULIN ASPART 100 UNIT/ML (3 ML) SUBCUTANEOUS
100 unit/mL (3 mL) | Status: AC
Start: 2020-02-27 — End: ?

## 2020-02-27 MED ORDER — LEVEMIR FLEXTOUCH U-100 INSULIN 100 UNIT/ML (3 ML) SUBCUTANEOUS PEN
100 unit/mL (3 mL) | Status: AC
Start: 2020-02-27 — End: ?

## 2020-02-27 NOTE — Patient Instructions
Defibrillator Transmissions1) Send manual transmissions every 3 months Health maintenance1) Please speak with your primary care physician (PCP) and/or other providers about receiving the following vaccines - including asking about how far apart from each other should they be obtained.  Please obtain them if approved by your providers: - Influenza vaccine (high dose a.k.a. double strength)- Pfizer COVID-19 booster -Shingles (Shingrix) vaccine - Pneumonia vaccine- Adult tetanus vaccine- Hepatitis B vaccines
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
## 2020-02-29 ENCOUNTER — Encounter: Admit: 2020-02-29 | Payer: PRIVATE HEALTH INSURANCE | Attending: Cardiovascular Disease | Primary: Internal Medicine

## 2020-03-10 ENCOUNTER — Other Ambulatory Visit: Payer: Self-pay

## 2020-03-10 ENCOUNTER — Ambulatory Visit (INDEPENDENT_AMBULATORY_CARE_PROVIDER_SITE_OTHER): Payer: BC Managed Care – PPO | Admitting: Family Medicine

## 2020-03-10 ENCOUNTER — Encounter: Payer: Self-pay | Admitting: Family Medicine

## 2020-03-10 VITALS — BP 144/72 | HR 108 | Wt 164.6 lb

## 2020-03-10 DIAGNOSIS — M545 Low back pain, unspecified: Secondary | ICD-10-CM | POA: Diagnosis not present

## 2020-03-10 DIAGNOSIS — I10 Essential (primary) hypertension: Secondary | ICD-10-CM | POA: Diagnosis not present

## 2020-03-10 DIAGNOSIS — Z87891 Personal history of nicotine dependence: Secondary | ICD-10-CM

## 2020-03-10 DIAGNOSIS — G8929 Other chronic pain: Secondary | ICD-10-CM

## 2020-03-10 DIAGNOSIS — Z122 Encounter for screening for malignant neoplasm of respiratory organs: Secondary | ICD-10-CM | POA: Diagnosis not present

## 2020-03-10 NOTE — Patient Instructions (Signed)
It was wonderful to see you today.  Please bring ALL of your medications with you to every visit.   Today we talked about:  - Checking your cholesterol in January  - Keep track of your blood pressure--measure once a week  - No blood work today   -- Your low dose CT is being scheduled   Thank you for choosing [facility].   Please call [phone] with any questions about today's appointment.  Please be sure to schedule follow up at the front  desk before you leave today.   Dorris Singh, MD  Family Medicine

## 2020-03-10 NOTE — Assessment & Plan Note (Signed)
Discussion as above.  Reviewed potential risks and benefits of low-dose lung CT.  Low-dose CT was ordered and scheduled for patient.

## 2020-03-10 NOTE — Assessment & Plan Note (Signed)
Reviewed x-rays with patient.  There is significant osteoarthritis with osteophyte formation at L5 and S1.  Suspect this is the cause of her symptoms.  Referral to orthopedics for further evaluation and possible epidural steroid injection

## 2020-03-10 NOTE — Assessment & Plan Note (Signed)
Reviewed home blood pressure values.  But values range from 1 22-8 35 systolic, most of the diastolics are in the 40A.  At this time we will continue the current medication regimen given her rather low diastolics.  Her systolic is near goal.

## 2020-03-10 NOTE — Progress Notes (Signed)
SUBJECTIVE:   CHIEF COMPLAINT / HPI:   Savina Olshefski is a pleasant 59 year old woman with history significant for hypertension and former tobacco abuse presenting today for follow-up.  She reports her only complaint today is low back pain.  She works in [employer] and has chronic low back pain that is nonradiating in nature.  She denies bowel or bladder incontinence radiating pain numbness or tingling in her feet.  She previously had epidural steroid injections and found benefit from the same.  She is not particular interested in surgery but would consider injections again.  She reports she would do PT but does not have time with her job.  She maintains an active lifestyle. 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   The patient is a former smoker.  She smoked from age 59 up until 71.  She smoked half pack a day each day.  She denies symptoms of lung cancer.  She specifically denies undesired weight loss fevers night sweats or change in her cough or sputum.  She denies hemoptysis.  She is interested in low-dose CT screening.  The risks including unnecessary biopsy, overdiagnosis radiation and detecting benign nodules were reviewed.  The potential benefits were also reviewed.  After discussion the patient agreed to proceed with the low-dose CT.  The patient reports compliance with her blood pressure medications.  She denies side effects from the newly prescribed Norvasc.  PERTINENT  PMH / PSH/Family/Social History : updated  OBJECTIVE:   BP (!) 144/72   Pulse (!) 108   Wt 164 lb 9.6 oz (74.7 kg)   LMP 04/12/2004   SpO2 96%   BMI 25.03 kg/m   Today's weight:  Last Weight  Most recent update: 03/10/2020  9:01 AM   Weight  74.7 kg (164 lb 9.6 oz)           Review of prior weights: Filed Weights   03/10/20 0901  Weight: 164 lb 9.6 oz (74.7 kg)     Cardiac: Regular rate and rhythm. Normal S1/S2. No murmurs, rubs, or gallops appreciated. Lungs: Clear  bilaterally to ascultation.     Psych: Pleasant and appropriate     ASSESSMENT/PLAN:   Hypertension Reviewed home blood pressure values---values range from 1 21-1 35 systolic, most of the diastolics are in the 94R.  At this time we will continue the current medication regimen given her rather low diastolics.  Her systolic is near goal.  Low back pain Reviewed x-rays with patient.  There is significant osteoarthritis with osteophyte formation at L5 and S1.  Suspect this is the cause of her symptoms.  Referral to orthopedics for further evaluation and possible epidural steroid injection  Former heavy tobacco smoker Discussion as above.  Reviewed potential risks and benefits of low-dose lung CT.  Low-dose CT was ordered and scheduled for patient.   HCM At follow up- ALT/AST, lipids. (ferritin elevated in past)   The 10-year ASCVD risk score Mikey Bussing DC [name]., et al., 2013) is: 10.1%   Values used to calculate the score:     Age: 31 years     Sex: Female     Is Non-Hispanic African American: Yes     Diabetic: No     Tobacco smoker: No     Systolic Blood Pressure: 740 mmHg     Is BP treated: Yes     HDL Cholesterol: 50 mg/dL     Total Cholesterol: 221 mg/dL   Dorris Singh, MD  [place]

## 2020-03-12 ENCOUNTER — Encounter: Admit: 2020-03-12 | Payer: MEDICARE | Attending: Cardiovascular Disease | Primary: Internal Medicine

## 2020-03-17 ENCOUNTER — Other Ambulatory Visit: Payer: Self-pay

## 2020-03-17 ENCOUNTER — Ambulatory Visit (INDEPENDENT_AMBULATORY_CARE_PROVIDER_SITE_OTHER): Payer: BC Managed Care – PPO | Admitting: Family Medicine

## 2020-03-17 ENCOUNTER — Encounter: Payer: Self-pay | Admitting: Family Medicine

## 2020-03-17 DIAGNOSIS — G8929 Other chronic pain: Secondary | ICD-10-CM | POA: Diagnosis not present

## 2020-03-17 DIAGNOSIS — M545 Low back pain, unspecified: Secondary | ICD-10-CM | POA: Diagnosis not present

## 2020-03-17 MED ORDER — BACLOFEN 10 MG PO TABS
5.0000 mg | ORAL_TABLET | Freq: Three times a day (TID) | ORAL | 3 refills | Status: DC | PRN
Start: 2020-03-17 — End: 2021-04-24

## 2020-03-17 NOTE — Assessment & Plan Note (Signed)
Patient presents with 40-month history of low back pain.  Imaging indicates osteoarthritis at L5-S1 region.  Here to discuss possible injections versus physical therapy.  Patient elects to try physical therapy but may return for injections did pending on the cost of the therapy. -Referral for PT placed -Prescription for baclofen sent to patient's pharmacy -Patient may require epidural steroid injection in the near future

## 2020-03-17 NOTE — Progress Notes (Signed)
I saw and examined the patient with Dr. Caron Presume and agree with assessment and plan as outlined.    Chronic midline LBP.   Had 3 ESI in 2008 with good results. Did well until a few months ago.  No radicular symptoms.  Tylenol not helping.  Will try PT, baclofen.  Referral for ESI if no relief.

## 2020-03-17 NOTE — Progress Notes (Signed)
Office Visit Note   Patient: [name]           Date of Birth: 06-17-1960           MRN: [id number] Visit Date: 03/17/2020 Requested by: Martyn Malay, MD [place],  [place] [id number] PCP: Martyn Malay, MD  Subjective: Chief Complaint  Patient presents with  . Lower Back - Pain    "Pressure" across lower back, more on the left. This flareup started months ago. H/o ESIs for the pain. Feels the same "pressure" on her knees. No numbness/tingling. Hurts with much standing and activities like sweeping the floor.    HPI: Patient is a 59 year old female presenting with low back pain for approximately 2 months.  Reports that she has had this similar issue in the past "years ago" and received injections which significantly improved her symptoms.  Patient has seen her PCP for these issues and I recommended she follow-up here.  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  It is all in her low back although she does report some knee pain.  Patient denies that the pain travels from her back down her leg or she experiences any numbness or tingling.  Patient has tried Tylenol as well as Voltaren gel with little to no relief.              ROS: Denies any loss of bowel or bladder continence, numbness, tingling, weakness in her lower extremities.  All other systems were reviewed and are negative.  Objective: Vital Signs: LMP 04/12/2004   Physical Exam:  General:  Alert and oriented, in no acute distress. Pulm:  Breathing unlabored. Psy:  Normal mood, congruent affect. Skin: Normal skin tone with no rashes noted MSK: Paraspinal tenderness in the lumbar sacral junction.  No decreased range of motion.  Strength in lower extremities 5 out of 5 bilaterally, sensation intact bilaterally   Imaging: Lumbar spine x-ray-at least moderate L5-S1 degenerative changes  Assessment & Plan: Low back pain Patient  presents with 4-month history of low back pain.  Imaging indicates osteoarthritis at L5-S1 region.  Here to discuss possible injections versus physical therapy.  Patient elects to try physical therapy but may return for injections did pending on the cost of the therapy. -Referral for PT placed -Prescription for baclofen sent to patient's pharmacy -Patient may require epidural steroid injection in the near future   Procedures: No procedures performed  No notes on file     PMFS History: Patient Active Problem List   Diagnosis Date Noted  . Low back pain 04/02/2019  . Overweight 04/02/2019  . Tubular adenoma 04/09/2018  . Elevated ferritin 02/23/2018  . Former heavy tobacco smoker 06/15/2017  . Alcohol use   . Hypertension 03/08/2012   Past Medical History:  Diagnosis Date  . Arthritis   . Hypertension 2005    Family History  Problem Relation Age of Onset  . Heart disease Mother   . Diabetes Mellitus II Mother   . Hypertension Mother   . Prostate cancer Father   . Heart disease Father   . Diabetes Mellitus II Brother   . Hypertension Brother   . Diabetes Mellitus II Brother   . Hyperlipidemia Sister   . Breast cancer Neg Hx   . Colon cancer Neg Hx   . Esophageal cancer Neg Hx   . Rectal cancer Neg Hx   .  Stomach cancer Neg Hx     Past Surgical History:  Procedure Laterality Date  . AXILLARY LYMPH NODE BIOPSY    . BREAST BIOPSY    . BREAST EXCISIONAL BIOPSY Left   . CESAREAN SECTION     x 1  . DIAGNOSTIC LAPAROSCOPY     for persistant right ovarian cyst   Social History   Occupational History  . Not on file  Tobacco Use  . Smoking status: Former Smoker    Packs/day: 0.25    Years: 39.00    Pack years: 9.75    Types: Cigarettes    Start date: 04/12/1977    Quit date: 12/20/2016    Years since quitting: 3.2  . Smokeless tobacco: Never Used  . Tobacco comment: Committed to long-term quit 10/10 on 02/28/17  Vaping Use  . Vaping Use: Never used  Substance  and Sexual Activity  . Alcohol use: Yes    Alcohol/week: 10.0 standard drinks    Types: 10 Standard drinks or equivalent per week    Comment: 2 drinks a day  . Drug use: No  . Sexual activity: Yes    Partners: Male

## 2020-03-21 ENCOUNTER — Encounter: Admit: 2020-03-21 | Payer: PRIVATE HEALTH INSURANCE | Attending: Cardiovascular Disease | Primary: Internal Medicine

## 2020-03-21 ENCOUNTER — Encounter: Admit: 2020-03-21 | Payer: PRIVATE HEALTH INSURANCE | Primary: Internal Medicine

## 2020-03-21 ENCOUNTER — Ambulatory Visit (HOSPITAL_COMMUNITY): Payer: BC Managed Care – PPO

## 2020-03-21 DIAGNOSIS — Z95810 Presence of automatic (implantable) cardiac defibrillator: Secondary | ICD-10-CM

## 2020-04-07 ENCOUNTER — Ambulatory Visit: Payer: BC Managed Care – PPO | Admitting: Rehabilitative and Restorative Service Providers"

## 2020-04-21 ENCOUNTER — Other Ambulatory Visit: Payer: Self-pay | Admitting: Family Medicine

## 2020-04-21 DIAGNOSIS — Z1231 Encounter for screening mammogram for malignant neoplasm of breast: Secondary | ICD-10-CM

## 2020-05-07 ENCOUNTER — Ambulatory Visit: Payer: BC Managed Care – PPO | Admitting: Rehabilitative and Restorative Service Providers"

## 2020-05-07 ENCOUNTER — Other Ambulatory Visit: Payer: Self-pay

## 2020-05-07 MED ORDER — HYDROCHLOROTHIAZIDE 25 MG PO TABS: 25.0000 mg | ORAL_TABLET | Freq: Every day | ORAL | 3 refills | Status: DC

## 2020-05-14 ENCOUNTER — Encounter: Admit: 2020-05-14 | Payer: PRIVATE HEALTH INSURANCE | Attending: Cardiovascular Disease | Primary: Internal Medicine

## 2020-06-02 ENCOUNTER — Other Ambulatory Visit: Payer: Self-pay

## 2020-06-02 ENCOUNTER — Ambulatory Visit
Admission: RE | Admit: 2020-06-02 | Discharge: 2020-06-02 | Disposition: A | Discharge status: Home / Self Care | Payer: BC Managed Care – PPO | Source: Ambulatory Visit | Attending: Family Medicine | Admitting: Family Medicine

## 2020-06-02 DIAGNOSIS — Z1231 Encounter for screening mammogram for malignant neoplasm of breast: Secondary | ICD-10-CM

## 2020-06-06 ENCOUNTER — Encounter: Payer: Self-pay | Admitting: Family Medicine

## 2020-06-20 ENCOUNTER — Encounter: Admit: 2020-06-20 | Payer: PRIVATE HEALTH INSURANCE | Primary: Internal Medicine

## 2020-06-20 ENCOUNTER — Encounter: Admit: 2020-06-20 | Payer: PRIVATE HEALTH INSURANCE | Attending: Cardiovascular Disease | Primary: Internal Medicine

## 2020-06-20 DIAGNOSIS — Z95810 Presence of automatic (implantable) cardiac defibrillator: Secondary | ICD-10-CM

## 2020-07-14 IMAGING — MG DIGITAL SCREENING BILAT W/ TOMO W/ CAD
8 series · 8 of 24 positions shown · non-contrast
Comparison: Previous exam(s).

CLINICAL DATA: Screening.

EXAM:
DIGITAL SCREENING BILATERAL MAMMOGRAM WITH TOMO AND CAD

[R MLO synth-2D]
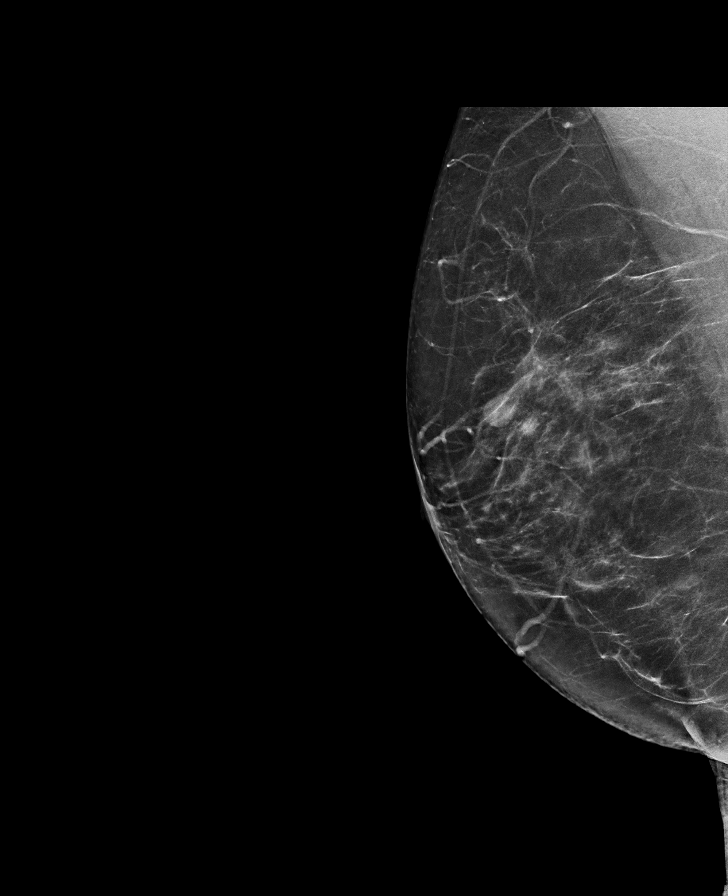

[R CC synth-2D]
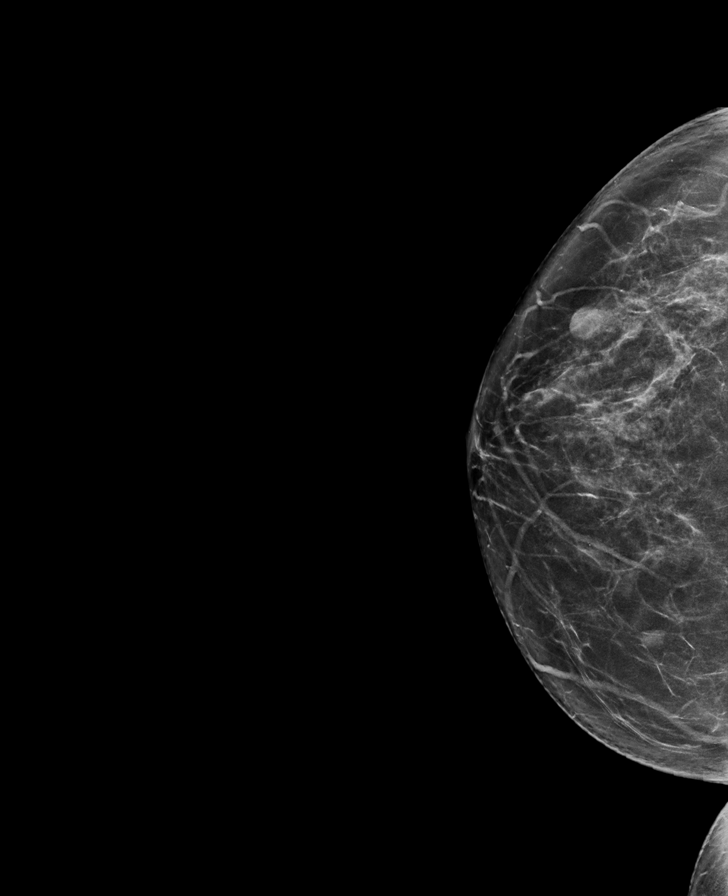

[L CC synth-2D]
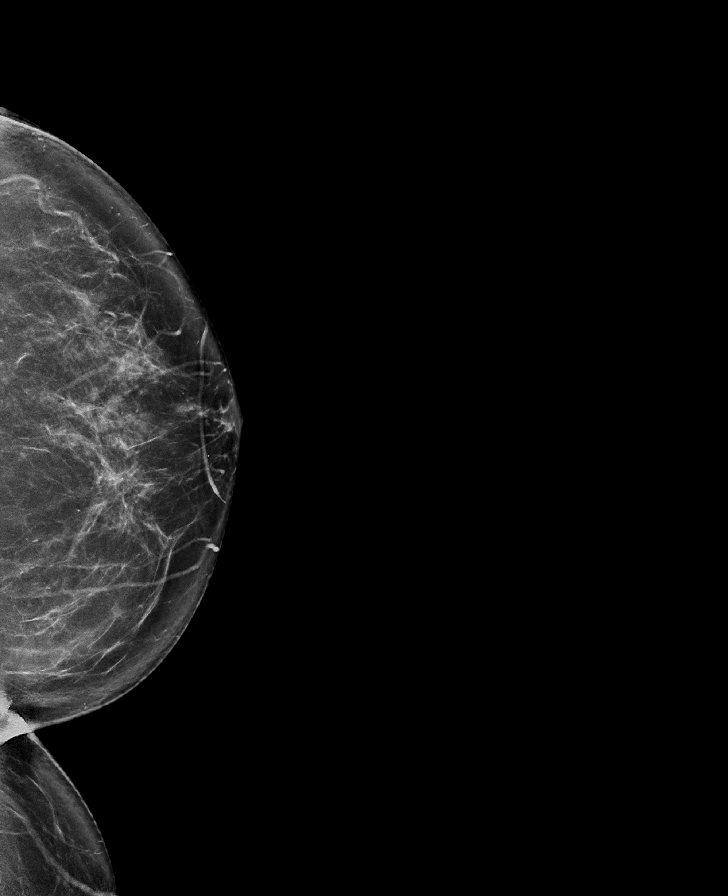

[L MLO synth-2D]
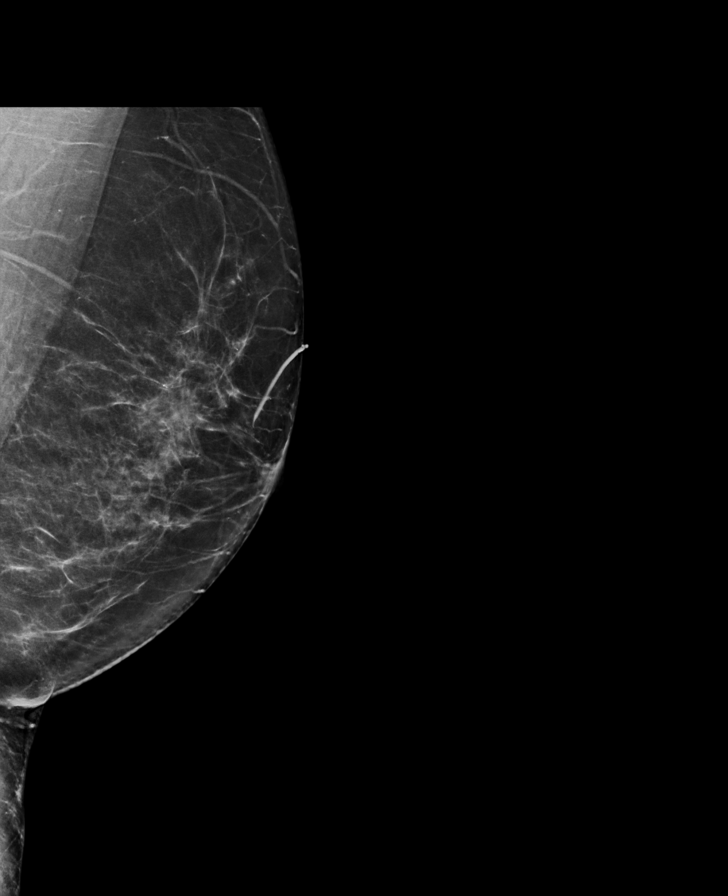

[R CC tomo · tomo slice 37/74.0]
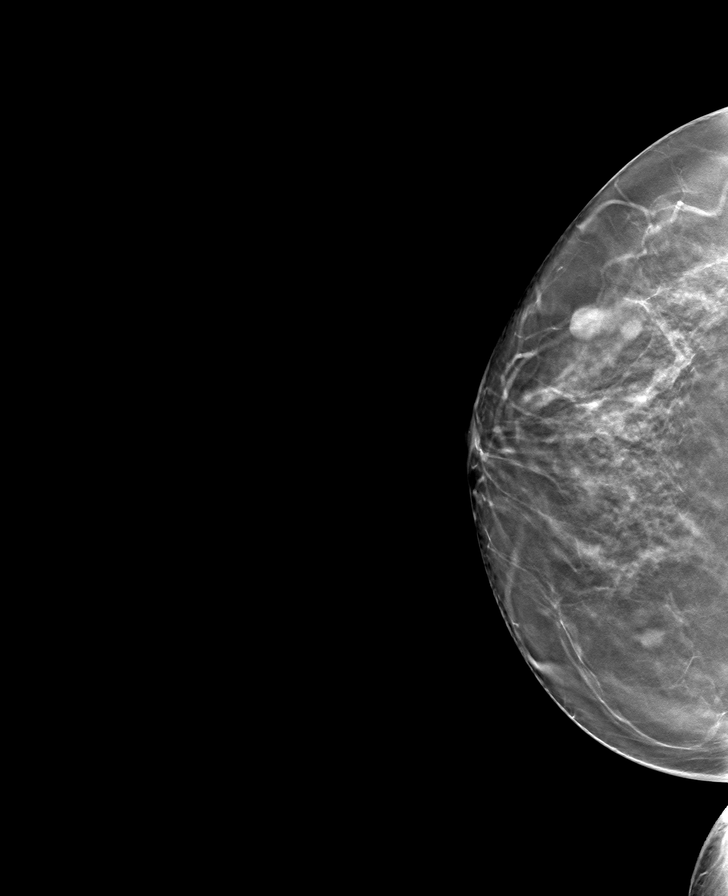

[R MLO tomo · tomo slice 39/77.0]
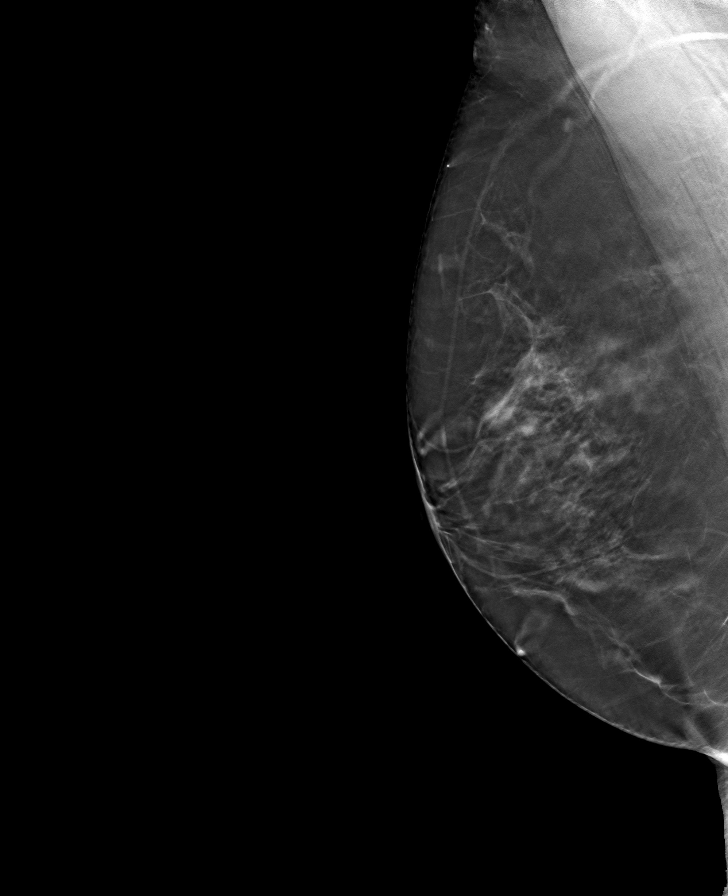

[L MLO tomo · tomo slice 38/75.0]
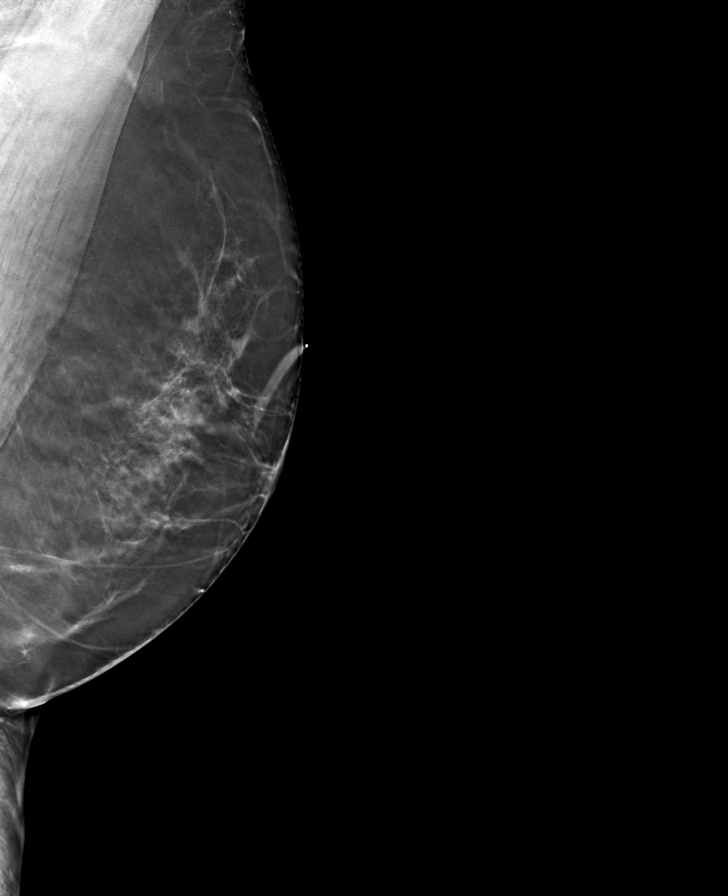

[L CC tomo · tomo slice 39/77.0]
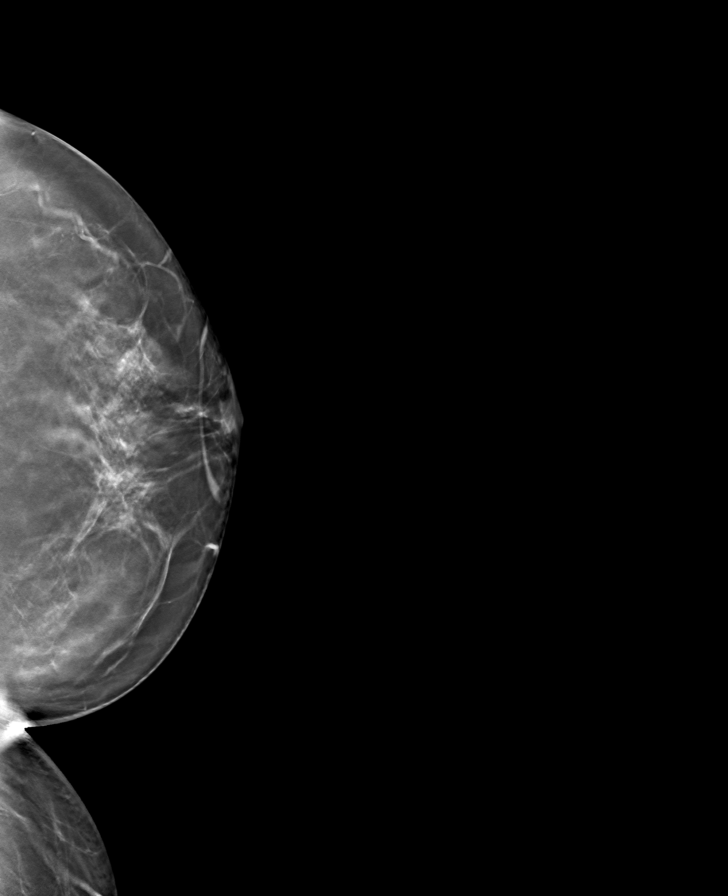

[8 of 24 positions shown; findings below may reference images not displayed]

ACR Breast Density Category b: There are scattered areas of
fibroglandular density.
FINDINGS: There are no findings suspicious for malignancy. Images were
processed with CAD.
IMPRESSION: No mammographic evidence of malignancy. A result letter of this
screening mammogram will be mailed directly to the patient.

RECOMMENDATION:
Screening mammogram in one year. (Code:[id number])

BI-RADS CATEGORY  1: Negative.

## 2020-07-17 ENCOUNTER — Other Ambulatory Visit: Payer: Self-pay

## 2020-07-17 DIAGNOSIS — R1013 Epigastric pain: Secondary | ICD-10-CM

## 2020-07-17 MED ORDER — FAMOTIDINE 20 MG PO TABS: 20.0000 mg | ORAL_TABLET | Freq: Two times a day (BID) | ORAL | 3 refills | Status: DC

## 2020-09-10 ENCOUNTER — Encounter: Admit: 2020-09-10 | Payer: PRIVATE HEALTH INSURANCE | Attending: Cardiovascular Disease | Primary: Internal Medicine

## 2020-09-10 DIAGNOSIS — K592 Neurogenic bowel, not elsewhere classified: Secondary | ICD-10-CM

## 2020-09-10 DIAGNOSIS — L409 Psoriasis, unspecified: Secondary | ICD-10-CM

## 2020-09-10 DIAGNOSIS — Z95810 Presence of automatic (implantable) cardiac defibrillator: Secondary | ICD-10-CM

## 2020-09-10 DIAGNOSIS — I2510 Atherosclerotic heart disease of native coronary artery without angina pectoris: Secondary | ICD-10-CM

## 2020-09-10 DIAGNOSIS — I499 Cardiac arrhythmia, unspecified: Secondary | ICD-10-CM

## 2020-09-10 DIAGNOSIS — Z789 Other specified health status: Secondary | ICD-10-CM

## 2020-09-10 DIAGNOSIS — I255 Ischemic cardiomyopathy: Secondary | ICD-10-CM

## 2020-09-10 DIAGNOSIS — I5022 Chronic systolic (congestive) heart failure: Secondary | ICD-10-CM

## 2020-09-10 DIAGNOSIS — K219 Gastro-esophageal reflux disease without esophagitis: Secondary | ICD-10-CM

## 2020-09-10 DIAGNOSIS — M009 Pyogenic arthritis, unspecified: Secondary | ICD-10-CM

## 2020-09-10 DIAGNOSIS — L8990 Pressure ulcer of unspecified site, unspecified stage: Secondary | ICD-10-CM

## 2020-09-10 DIAGNOSIS — A419 Sepsis, unspecified organism: Secondary | ICD-10-CM

## 2020-09-10 DIAGNOSIS — Z973 Presence of spectacles and contact lenses: Secondary | ICD-10-CM

## 2020-09-10 DIAGNOSIS — E7800 Pure hypercholesterolemia, unspecified: Secondary | ICD-10-CM

## 2020-09-10 DIAGNOSIS — T82198S Other mechanical complication of other cardiac electronic device, sequela: Secondary | ICD-10-CM

## 2020-09-10 DIAGNOSIS — N319 Neuromuscular dysfunction of bladder, unspecified: Secondary | ICD-10-CM

## 2020-09-10 DIAGNOSIS — N390 Urinary tract infection, site not specified: Secondary | ICD-10-CM

## 2020-09-10 DIAGNOSIS — Z993 Dependence on wheelchair: Secondary | ICD-10-CM

## 2020-09-10 DIAGNOSIS — I472 VT (ventricular tachycardia) (HC Code): Secondary | ICD-10-CM

## 2020-09-10 DIAGNOSIS — N200 Calculus of kidney: Secondary | ICD-10-CM

## 2020-09-10 DIAGNOSIS — M869 Osteomyelitis, unspecified: Secondary | ICD-10-CM

## 2020-09-10 DIAGNOSIS — S14107A Unspecified injury at C7 level of cervical spinal cord, initial encounter: Secondary | ICD-10-CM

## 2020-09-10 DIAGNOSIS — E119 Type 2 diabetes mellitus without complications: Secondary | ICD-10-CM

## 2020-09-10 DIAGNOSIS — I219 Acute myocardial infarction, unspecified: Secondary | ICD-10-CM

## 2020-09-10 DIAGNOSIS — G8250 Quadriplegia, unspecified: Secondary | ICD-10-CM

## 2020-09-10 DIAGNOSIS — Z4502 Encounter for adjustment and management of automatic implantable cardiac defibrillator: Secondary | ICD-10-CM

## 2020-09-10 DIAGNOSIS — I509 Heart failure, unspecified: Secondary | ICD-10-CM

## 2020-09-10 NOTE — Patient Instructions
Health maintenance1) Please access MyChart to view the immunizations that you MAY be missing per the Assessment and Plan section of today's note by Dr. Orpha Bur. Please speak with your primary care physician (PCP) and/or other providers about receiving these vaccines - including asking if you have already received these vaccines, the need for these vaccines, risks/adverse or side effects, and how far apart from each other should they be obtained.
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
## 2020-09-11 ENCOUNTER — Encounter: Admit: 2020-09-11 | Payer: PRIVATE HEALTH INSURANCE | Attending: Cardiovascular Disease | Primary: Internal Medicine

## 2020-09-18 ENCOUNTER — Encounter: Admit: 2020-09-18 | Payer: PRIVATE HEALTH INSURANCE | Primary: Internal Medicine

## 2020-09-18 ENCOUNTER — Encounter: Admit: 2020-09-18 | Payer: PRIVATE HEALTH INSURANCE | Attending: Cardiovascular Disease | Primary: Internal Medicine

## 2020-09-18 DIAGNOSIS — I255 Ischemic cardiomyopathy: Secondary | ICD-10-CM

## 2020-09-18 DIAGNOSIS — I472 VT (ventricular tachycardia) (HC Code): Secondary | ICD-10-CM

## 2020-09-18 DIAGNOSIS — Z4502 Encounter for adjustment and management of automatic implantable cardiac defibrillator: Secondary | ICD-10-CM

## 2020-10-23 ENCOUNTER — Ambulatory Visit
Admit: 2020-10-23 | Payer: PRIVATE HEALTH INSURANCE | Attending: Complex General Surgical Oncology | Primary: Internal Medicine

## 2020-10-24 ENCOUNTER — Encounter: Admit: 2020-10-24 | Payer: PRIVATE HEALTH INSURANCE | Primary: Internal Medicine

## 2020-10-24 ENCOUNTER — Encounter: Admit: 2020-10-24 | Payer: PRIVATE HEALTH INSURANCE | Attending: Cardiovascular Disease | Primary: Internal Medicine

## 2020-10-24 DIAGNOSIS — Z4502 Encounter for adjustment and management of automatic implantable cardiac defibrillator: Secondary | ICD-10-CM

## 2021-01-15 ENCOUNTER — Other Ambulatory Visit: Payer: Self-pay | Admitting: *Deleted

## 2021-01-15 DIAGNOSIS — R1013 Epigastric pain: Secondary | ICD-10-CM

## 2021-01-15 MED ORDER — FAMOTIDINE 20 MG PO TABS: 20.0000 mg | ORAL_TABLET | Freq: Two times a day (BID) | ORAL | 3 refills | Status: DC

## 2021-01-23 ENCOUNTER — Encounter: Admit: 2021-01-23 | Payer: PRIVATE HEALTH INSURANCE | Primary: Internal Medicine

## 2021-01-23 ENCOUNTER — Encounter: Admit: 2021-01-23 | Payer: PRIVATE HEALTH INSURANCE | Attending: Cardiovascular Disease | Primary: Internal Medicine

## 2021-01-23 DIAGNOSIS — Z95810 Presence of automatic (implantable) cardiac defibrillator: Secondary | ICD-10-CM

## 2021-03-23 ENCOUNTER — Ambulatory Visit (INDEPENDENT_AMBULATORY_CARE_PROVIDER_SITE_OTHER): Payer: BC Managed Care – PPO | Admitting: Family Medicine

## 2021-03-23 ENCOUNTER — Ambulatory Visit
Admission: RE | Admit: 2021-03-23 | Discharge: 2021-03-23 | Disposition: A | Discharge status: Home / Self Care | Payer: BC Managed Care – PPO | Source: Ambulatory Visit | Attending: Family Medicine | Admitting: Family Medicine

## 2021-03-23 ENCOUNTER — Encounter: Payer: Self-pay | Admitting: Family Medicine

## 2021-03-23 ENCOUNTER — Other Ambulatory Visit: Payer: Self-pay

## 2021-03-23 VITALS — BP 140/72 | HR 87 | Ht 68.0 in | Wt 166.0 lb

## 2021-03-23 DIAGNOSIS — E663 Overweight: Secondary | ICD-10-CM

## 2021-03-23 DIAGNOSIS — Z87891 Personal history of nicotine dependence: Secondary | ICD-10-CM

## 2021-03-23 DIAGNOSIS — Z23 Encounter for immunization: Secondary | ICD-10-CM

## 2021-03-23 DIAGNOSIS — Z0000 Encounter for general adult medical examination without abnormal findings: Secondary | ICD-10-CM

## 2021-03-23 DIAGNOSIS — R0789 Other chest pain: Secondary | ICD-10-CM

## 2021-03-23 DIAGNOSIS — I10 Essential (primary) hypertension: Secondary | ICD-10-CM | POA: Diagnosis not present

## 2021-03-23 DIAGNOSIS — L810 Postinflammatory hyperpigmentation: Secondary | ICD-10-CM

## 2021-03-23 MED ORDER — TRIAMCINOLONE ACETONIDE 0.025 % EX OINT: 1.0000 "application " | TOPICAL_OINTMENT | Freq: Two times a day (BID) | CUTANEOUS | 0 refills | Status: DC

## 2021-03-23 NOTE — Patient Instructions (Addendum)
It was wonderful to see you today.  Please bring ALL of your medications with you to every visit.   Today we talked about:   If you develop chest pain last longer than 5 minutes please present to the ED  I will call you with blood work  Follow up in 1 month     An x-ray was ordered for you---you do not need an appointment to have this completed.  I recommend going to [facility]   If the results are normal,I will send you a letter  I will call you with results if anything is abnormal   I think you have a condition of the foot called  metatarsalgia       Thank you for choosing [facility].   Please call [phone] with any questions about today's appointment.  Please be sure to schedule follow up at the front  desk before you leave today.   Dorris Singh, MD  Family Medicine

## 2021-03-23 NOTE — Progress Notes (Signed)
SUBJECTIVE:   Chief compliant/HPI: annual examination  [name] is a 60 y.o. who presents today for an annual exam.   Chest discomfort Patient reports about 2 weeks of chest tightness associated with cough.  This occurs intermittently.  This throughout the day and then she coughs afterwards.  She has no mucus production with this.  She denies chest pain with exertion, chest pressure or pleuritic type chest pain.  Last year she reported some dyspnea on exertion she reports this is persistent and improved.  She does not usually have a cough.  Denies fevers, chills, unintentional weight loss, back pain, lower extremity edema.  There is a family history of cardiac disease in mother.Patient quit smoking 3 years ago. Smoked 1/2 pack to 1 ppd for 39 years.   HTN Home values 120-130/70s. She is taking HCTZ. No headaches, dizziness vision changes.  Patient reports some right foot pain. Occurs when she squeezes or ties shoes tightly. Located on dorsum of right foot. No increase in activity. No pain at night. No trauma.   History tabs reviewed and updated reviewed.   Review of systems form reviewed and notable for as above.   OBJECTIVE:   BP 140/72   Pulse 87   Ht 5\' 8"  (1.727 m)   Wt 166 lb (75.3 kg)   LMP 04/12/2004   SpO2 100%   BMI 25.24 kg/m    Cardiac: Regular rate and rhythm. Normal S1/S2. No murmurs, rubs, or gallops appreciated. Lungs: Clear bilaterally to ascultation.  Bilateral feet are examined.  There is no deformity.  There are good pulses on the dorsum of the left foot over the second digit there is some hyperpigmented and thickened skin.  There is also some overlying flaking.  Reproduced pain with squeeze test on the right of the metatarsals.  No pain when she stands. Psych: Pleasant and appropriate    ASSESSMENT/PLAN:   Former heavy tobacco smoker Low dose CT ordered, discussed.   Overweight A1C today.   Hypertension Repeat BMP today. At goal on home  readings.  Metatarsalgia recommended shoe insert.  If symptoms persist consider imaging.  Postinflammatory hyperpigmentation, steroid to area twice a day for 1 week.  Monitor not improved consider further investigation  Chest Pain with cough, differential is broad and includes bronchitis, also considered underlying lung nodule or malignancy, COPD.  Given family history and her dyspnea on exertion I am concerned for underlying coronary artery disease as a cause of her dyspnea (less so a cause of her CP).  Her chest pain type is not consistent with angina.  It lasts only few seconds and is associated with a cough and intermittent palpitations.  Returning for EKG on 13 December.  We will obtain CBC to evaluate for anemia given dyspnea, lipid panel to risk stratify, chest x-ray. CT ordered as below for underlying nodule.  Reviewed ED return precautions. Unlikely PE as dyspnea has been ongoing 1 year, chest pain associated with cough, absence of pleuritic CP or hemoptysis. HR in 80s. NO personal or family history of VTE.   Given her family history, underlying risk factors including hypertension, history of tobacco use and dyslipidemia will refer to cardiology for recommendations regarding further testing.  Annual Examination  See AVS for age appropriate recommendations  BP reviewed and at goal   Considered the following items based upon USPSTF recommendations: Diabetes screening: ordered Screening for elevated cholesterol: ordered HIV testing:  previously negative Hepatitis C:  previously negative Hepatitis B:  NA  Syphilis if at high risk:  NA GC/CT  NA Reviewed risk factors for latent tuberculosis and not indicated   Cervical cancer screening:  due 2023 Breast cancer screening:  due in February  Colorectal cancer screening: up to date on screening for CRC. Lung cancer screening: discussed. See documentation below regarding indications/risks/benefits.  Vaccinations flu given.   Follow up in  1  month or sooner if indicated. At follow up consider repeat ferritin.    Martyn Malay, MD [place]

## 2021-03-23 NOTE — Assessment & Plan Note (Signed)
Low dose CT ordered, discussed.

## 2021-03-23 NOTE — Assessment & Plan Note (Signed)
A1C today. 

## 2021-03-23 NOTE — Assessment & Plan Note (Signed)
Repeat BMP today. At goal on home readings.

## 2021-03-24 ENCOUNTER — Ambulatory Visit: Payer: BC Managed Care – PPO

## 2021-03-25 ENCOUNTER — Telehealth: Payer: Self-pay | Admitting: Family Medicine

## 2021-03-25 DIAGNOSIS — R0600 Dyspnea, unspecified: Secondary | ICD-10-CM

## 2021-03-25 DIAGNOSIS — E785 Hyperlipidemia, unspecified: Secondary | ICD-10-CM

## 2021-03-25 LAB — CBC
Hematocrit: 36.4 % (ref 34.0–46.6)
Hemoglobin: 12.2 g/dL (ref 11.1–15.9)
MCH: 32.3 pg (ref 26.6–33.0)
MCHC: 33.5 g/dL (ref 31.5–35.7)
MCV: 96 fL (ref 79–97)
Platelets: 385 10*3/uL (ref 150–450)
RBC: 3.78 x10E6/uL (ref 3.77–5.28)
RDW: 13 % (ref 11.7–15.4)
WBC: 7.4 10*3/uL (ref 3.4–10.8)

## 2021-03-25 LAB — LIPID PANEL
Chol/HDL Ratio: 5 ratio — ABNORMAL HIGH (ref 0.0–4.4)
Cholesterol, Total: 227 mg/dL — ABNORMAL HIGH (ref 100–199)
HDL: 45 mg/dL (ref >39)
LDL Chol Calc (NIH): 152 mg/dL — ABNORMAL HIGH (ref 0–99)
Triglycerides: 166 mg/dL — ABNORMAL HIGH (ref 0–149)
VLDL Cholesterol Cal: 30 mg/dL (ref 5–40)

## 2021-03-25 LAB — HEMOGLOBIN A1C
Est. average glucose Bld gHb Est-mCnc: 140 mg/dL
Hgb A1c MFr Bld: 6.5 % — ABNORMAL HIGH (ref 4.8–5.6)

## 2021-03-25 LAB — BASIC METABOLIC PANEL
BUN/Creatinine Ratio: 21 (ref 9–23)
BUN: 15 mg/dL (ref 6–24)
CO2: 24 mmol/L (ref 20–29)
Calcium: 9.9 mg/dL (ref 8.7–10.2)
Chloride: 98 mmol/L (ref 96–106)
Creatinine, Ser: 0.7 mg/dL (ref 0.57–1.00)
Glucose: 94 mg/dL (ref 70–99)
Potassium: 3.9 mmol/L (ref 3.5–5.2)
Sodium: 140 mmol/L (ref 134–144)
eGFR: 100 mL/min/{1.73_m2} (ref >59)

## 2021-03-25 MED ORDER — ATORVASTATIN CALCIUM 20 MG PO TABS: 20.0000 mg | ORAL_TABLET | Freq: Every day | ORAL | 3 refills | Status: DC

## 2021-03-25 NOTE — Telephone Encounter (Addendum)
Called patient about results.  She reports overall she feels well.  We reviewed her A1c of 6.5.  As she is only had 1 value and needs to to meet ADA criteria for diabetes we will repeat in 3 months.  ASCVD 10% of moderate intensity and statin was started.  Discussed side effects and indication and benefits with patient.  All questions answered.  Please schedule patient CT and echocardiogram.  Echocardiogram has been ordered.  Please advise patient of time and date of echocardiogram and CT.      I reviewed return precautions with her.

## 2021-03-26 NOTE — Telephone Encounter (Signed)
Pt scheduled for both imagining and ekg and informed. Soraiya Ahner Kennon Holter, CMA

## 2021-03-30 ENCOUNTER — Ambulatory Visit: Payer: BC Managed Care – PPO

## 2021-03-30 NOTE — Progress Notes (Signed)
° ° °  SUBJECTIVE:   CHIEF COMPLAINT / HPI:   Chest Pain Follow-Up Patient seen on 03/23/2021 for annual physical. At that time she reported chest pain that occurred with coughing as well as intermittent palpitations. EKG machine was not working that day so she returns today for EKG.  She reports a "fluttering" sensation x3 weeks. Described like skipping a beat, but not a heart racing. Occurs a few times per day, lasts only a few seconds and then resolves. She denies any chest pain- just feels the fluttering is uncomfortable.  Patient had also endorsed dyspnea on exertion during her physical. She had CBC, BMP, and CXR which were all unremarkable. Lipid panel was also obtained and she was started on a statin due to ASCVD risk of 10.6%. Cardiology referral placed at that time as well due to concern for possible cardiac etiology, especially in light of her history of tobacco use. When asked about her dyspnea today, patient describes a "worn out" feeling when she exerts herself. Occurs when she takes the trash out, when she lifts boxes at work, when she cleans or sweeps the floor. Denies shortness of breath but just feels fatigued. Symptoms never occur at rest.   PERTINENT  PMH / PSH: HTN, prediabetes>diabetes (A1c 6.5% on 03/23/2021), hx of heavy tobacco use (quit 2018, 1/2 to 1PPD for almost 40 years)  OBJECTIVE:   BP (!) 144/78    Pulse 91    Ht 5\' 8"  (1.727 m)    Wt 163 lb 6 oz (74.1 kg)    LMP 04/12/2004    SpO2 100%    BMI 24.84 kg/m   Gen: alert, well-appearing, NAD CV: RRR, normal S1/S2 without m/r/g. No chest wall tenderness Resp: normal work of breathing, lungs CTAB Ext: no peripheral edema Psych: appropriate mood and affect  ASSESSMENT/PLAN:   Palpitations Present x3 weeks. EKG obtained today reveals T wave inversions in anteroseptal leads but is otherwise unremarkable. Appears unchanged from prior EKG, although prior EKG difficult to interpret due to artifact. Symptoms possibly  related to PACs/PVCs vs sinus tach, although none of these detected today. Felt less likely to be a-fib. Anemia ruled out at last visit. -Already has appt w/cardiology on Jan 13th due to concern for cardiac etiology of dyspnea (see Dr Owens Shark note from 12/12). Consider Zio patch at that time if ongoing palpitations. -Also has echo scheduled in January    Alcus Dad, MD [place]

## 2021-03-31 ENCOUNTER — Ambulatory Visit (INDEPENDENT_AMBULATORY_CARE_PROVIDER_SITE_OTHER): Payer: BC Managed Care – PPO | Admitting: Family Medicine

## 2021-03-31 ENCOUNTER — Ambulatory Visit (HOSPITAL_COMMUNITY)
Admission: RE | Admit: 2021-03-31 | Discharge: 2021-03-31 | Disposition: A | Discharge status: Home / Self Care | Payer: BC Managed Care – PPO | Source: Ambulatory Visit | Attending: Family Medicine | Admitting: Family Medicine

## 2021-03-31 ENCOUNTER — Other Ambulatory Visit: Payer: Self-pay

## 2021-03-31 VITALS — BP 144/78 | HR 91 | Ht 68.0 in | Wt 163.4 lb

## 2021-03-31 DIAGNOSIS — R002 Palpitations: Secondary | ICD-10-CM | POA: Diagnosis present

## 2021-03-31 NOTE — Patient Instructions (Addendum)
It was great to meet you!  There were no concerning findings on your EKG today. Continue taking your medications as prescribed and follow up with the cardiologists in January. They may choose to do a monitor that looks at your heart rhythm over the course of several days.   If you develop worsening chest pain, shortness of breath, passing out, or other concerns please seek care sooner.  Take care,  Dr. [facility]

## 2021-03-31 NOTE — Assessment & Plan Note (Addendum)
Present x3 weeks. EKG obtained today reveals T wave inversions in anteroseptal leads but is otherwise unremarkable. Appears unchanged from prior EKG, although prior EKG difficult to interpret due to artifact. Symptoms possibly related to PACs/PVCs vs sinus tach, although none of these detected today. Felt less likely to be a-fib. Anemia ruled out at last visit. -Already has appt w/cardiology on Jan 13th due to concern for cardiac etiology of dyspnea (see Dr Owens Shark note from 12/12). Consider Zio patch at that time if ongoing palpitations. -Also has echo scheduled in January

## 2021-04-03 ENCOUNTER — Ambulatory Visit (HOSPITAL_COMMUNITY): Payer: BC Managed Care – PPO

## 2021-04-10 ENCOUNTER — Telehealth: Admit: 2021-04-10 | Payer: PRIVATE HEALTH INSURANCE | Attending: Cardiovascular Disease | Primary: Internal Medicine

## 2021-04-10 NOTE — Telephone Encounter
Left message to confirm appointment with Dr. Dell Ponto

## 2021-04-15 ENCOUNTER — Encounter: Admit: 2021-04-15 | Payer: PRIVATE HEALTH INSURANCE | Attending: Cardiovascular Disease | Primary: Internal Medicine

## 2021-04-15 DIAGNOSIS — M009 Pyogenic arthritis, unspecified: Secondary | ICD-10-CM

## 2021-04-15 DIAGNOSIS — L409 Psoriasis, unspecified: Secondary | ICD-10-CM

## 2021-04-15 DIAGNOSIS — N319 Neuromuscular dysfunction of bladder, unspecified: Secondary | ICD-10-CM

## 2021-04-15 DIAGNOSIS — S14107A Unspecified injury at C7 level of cervical spinal cord, initial encounter: Secondary | ICD-10-CM

## 2021-04-15 DIAGNOSIS — I2510 Atherosclerotic heart disease of native coronary artery without angina pectoris: Secondary | ICD-10-CM

## 2021-04-15 DIAGNOSIS — I499 Cardiac arrhythmia, unspecified: Secondary | ICD-10-CM

## 2021-04-15 DIAGNOSIS — K592 Neurogenic bowel, not elsewhere classified: Secondary | ICD-10-CM

## 2021-04-15 DIAGNOSIS — Z973 Presence of spectacles and contact lenses: Secondary | ICD-10-CM

## 2021-04-15 DIAGNOSIS — Z95810 Presence of automatic (implantable) cardiac defibrillator: Secondary | ICD-10-CM

## 2021-04-15 DIAGNOSIS — I472 VT (ventricular tachycardia) (HC Code): Secondary | ICD-10-CM

## 2021-04-15 DIAGNOSIS — T82198D Other mechanical complication of other cardiac electronic device, subsequent encounter: Secondary | ICD-10-CM

## 2021-04-15 DIAGNOSIS — M869 Osteomyelitis, unspecified: Secondary | ICD-10-CM

## 2021-04-15 DIAGNOSIS — E119 Type 2 diabetes mellitus without complications: Secondary | ICD-10-CM

## 2021-04-15 DIAGNOSIS — Z993 Dependence on wheelchair: Secondary | ICD-10-CM

## 2021-04-15 DIAGNOSIS — I255 Ischemic cardiomyopathy: Secondary | ICD-10-CM

## 2021-04-15 DIAGNOSIS — Z789 Other specified health status: Secondary | ICD-10-CM

## 2021-04-15 DIAGNOSIS — I5022 Chronic systolic (congestive) heart failure: Secondary | ICD-10-CM

## 2021-04-15 DIAGNOSIS — L8990 Pressure ulcer of unspecified site, unspecified stage: Secondary | ICD-10-CM

## 2021-04-15 DIAGNOSIS — K219 Gastro-esophageal reflux disease without esophagitis: Secondary | ICD-10-CM

## 2021-04-15 DIAGNOSIS — G8250 Quadriplegia, unspecified: Secondary | ICD-10-CM

## 2021-04-15 DIAGNOSIS — N390 Urinary tract infection, site not specified: Secondary | ICD-10-CM

## 2021-04-15 DIAGNOSIS — I219 Acute myocardial infarction, unspecified: Secondary | ICD-10-CM

## 2021-04-15 DIAGNOSIS — E7800 Pure hypercholesterolemia, unspecified: Secondary | ICD-10-CM

## 2021-04-15 DIAGNOSIS — N200 Calculus of kidney: Secondary | ICD-10-CM

## 2021-04-15 DIAGNOSIS — I509 Heart failure, unspecified: Secondary | ICD-10-CM

## 2021-04-15 DIAGNOSIS — A419 Sepsis, unspecified organism: Secondary | ICD-10-CM

## 2021-04-15 NOTE — Patient Instructions
Follow-up appointment with Dr. Oletha Blend) Please call my secretary Malvin Johns on Thursday 04/16/21 at the [facility] location to schedule a follow up appointment in 6 months. Upcoming Cataract Surgery 1) It should be performed in the hospital with cardiac monitoring 2) Magnet should be applied over generator of defibrillator or pacemaker if electrocautery/cautery is used 3) Cautery/electrocautery should be minimized

## 2021-04-17 ENCOUNTER — Encounter: Admit: 2021-04-17 | Payer: PRIVATE HEALTH INSURANCE | Attending: Cardiovascular Disease | Primary: Internal Medicine

## 2021-04-24 ENCOUNTER — Encounter: Admit: 2021-04-24 | Payer: PRIVATE HEALTH INSURANCE | Attending: Cardiovascular Disease | Primary: Internal Medicine

## 2021-04-24 ENCOUNTER — Encounter: Admit: 2021-04-24 | Payer: PRIVATE HEALTH INSURANCE | Primary: Internal Medicine

## 2021-04-24 ENCOUNTER — Encounter: Payer: Self-pay | Admitting: Interventional Cardiology

## 2021-04-24 ENCOUNTER — Other Ambulatory Visit: Payer: Self-pay

## 2021-04-24 ENCOUNTER — Ambulatory Visit (INDEPENDENT_AMBULATORY_CARE_PROVIDER_SITE_OTHER): Payer: Medicare HMO | Admitting: Interventional Cardiology

## 2021-04-24 VITALS — BP 140/86 | HR 86 | Ht 68.0 in | Wt 165.0 lb

## 2021-04-24 DIAGNOSIS — R0609 Other forms of dyspnea: Secondary | ICD-10-CM

## 2021-04-24 DIAGNOSIS — Z8249 Family history of ischemic heart disease and other diseases of the circulatory system: Secondary | ICD-10-CM

## 2021-04-24 DIAGNOSIS — R072 Precordial pain: Secondary | ICD-10-CM | POA: Diagnosis not present

## 2021-04-24 DIAGNOSIS — I10 Essential (primary) hypertension: Secondary | ICD-10-CM

## 2021-04-24 DIAGNOSIS — E782 Mixed hyperlipidemia: Secondary | ICD-10-CM | POA: Diagnosis not present

## 2021-04-24 DIAGNOSIS — Z95810 Presence of automatic (implantable) cardiac defibrillator: Secondary | ICD-10-CM

## 2021-04-24 MED ORDER — METOPROLOL TARTRATE 100 MG PO TABS: ORAL_TABLET | ORAL | 0 refills | Status: DC

## 2021-04-24 NOTE — Patient Instructions (Signed)
Medication Instructions:  Your physician recommends that you continue on your current medications as directed. Please refer to the Current Medication list given to you today.  *If you need a refill on your cardiac medications before your next appointment, please call your pharmacy*   Lab Work: Lab work to be done today--BMP If you have labs (blood work) drawn today and your tests are completely normal, you will receive your results only by: [place] (if you have MyChart) OR A paper copy in the mail If you have any lab test that is abnormal or we need to change your treatment, we will call you to review the results.   Testing/Procedures: Your physician has requested that you have cardiac CT. Cardiac computed tomography (CT) is a painless test that uses an x-ray machine to take clear, detailed pictures of your heart. For further information please visit [email or web address]. Please follow instruction sheet as given.     Follow-Up: At [facility], you and your health needs are our priority.  As part of our continuing mission to provide you with exceptional heart care, we have created designated Provider Care Teams.  These Care Teams include your primary Cardiologist (physician) and Advanced Practice Providers (APPs -  Physician Assistants and Nurse Practitioners) who all work together to provide you with the care you need, when you need it.  We recommend signing up for the patient portal called "MyChart".  Sign up information is provided on this After Visit Summary.  MyChart is used to connect with patients for Virtual Visits (Telemedicine).  Patients are able to view lab/test results, encounter notes, upcoming appointments, etc.  Non-urgent messages can be sent to your provider as well.   To learn more about what you can do with MyChart, go to [email or web address].    Your next appointment:   Based on results  The format for your next appointment:   In Person  Provider:    Larae Grooms, MD     Other Instructions   Your cardiac CT will be scheduled at one of the below locations:   [facility] [address] [place], [place] [address] [phone]  [facility] [address] [place], [place] [address] [phone]  If scheduled at [facility], please arrive at the [facility] main entrance (entrance A) [facility] 30 minutes prior to test start time. You can use the FREE valet parking offered at the main entrance (encouraged to control the heart rate for the test) Proceed to the [facility] Radiology Department (first floor) to check-in and test prep.  If scheduled at [facility], please arrive 15 mins early for check-in and test prep.  Please follow these instructions carefully (unless otherwise directed):  Marland Kitchen  On the Night Before the Test: Be sure to Drink plenty of water. Do not consume any caffeinated/decaffeinated beverages or chocolate 12 hours prior to your test. Do not take any antihistamines 12 hours prior to your test.   On the Day of the Test: Drink plenty of water until 1 hour prior to the test. Do not eat any food 4 hours prior to the test. You may take your regular medications prior to the test.  Take metoprolol (Lopressor) two hours prior to test. HOLD Furosemide/Hydrochlorothiazide morning of the test. FEMALES- please wear underwire-free bra if available, avoid dresses & tight clothing        After the Test: Drink plenty of water. After receiving  IV contrast, you may experience a mild flushed feeling. This is normal. On occasion, you may experience a mild rash up to 24 hours after the test. This is not dangerous. If this occurs, you can take Benadryl 25 mg and increase your fluid intake. If you experience trouble breathing, this can be serious. If it is severe call 911 IMMEDIATELY. If it is mild, please call our  office. If you take any of these medications: Glipizide/Metformin, Avandament, Glucavance, please do not take 48 hours after completing test unless otherwise instructed.  We will call to schedule your test 2-4 weeks out understanding that some insurance companies will need an authorization prior to the service being performed.   For non-scheduling related questions, please contact the cardiac imaging nurse navigator should you have any questions/concerns: Marchia Bond, Cardiac Imaging Nurse Navigator Gordy Clement, Cardiac Imaging Nurse Navigator [facility] Direct Office Dial: [phone]   For scheduling needs, including cancellations and rescheduling, please call [place], [phone].

## 2021-04-24 NOTE — Progress Notes (Signed)
Cardiology Office Note   Date:  04/24/2021   ID:  [name], DOB [birth year], MRN [id number]  PCP:  Martyn Malay, MD    Chief Complaint  Patient presents with   Establish Care    Having chest pain for about 1 month      Wt Readings from Last 3 Encounters:  04/24/21 165 lb (74.8 kg)  03/31/21 163 lb 6 oz (74.1 kg)  03/23/21 166 lb (75.3 kg)       History of Present Illness: [name] is a 61 y.o. female who is being seen today for the evaluation of chest/throat discomfort at the request of Martyn Malay, MD.   Can be triggered by walking or deep breathing.    She works at [employer].  Job is very physically active.  She is getting more tired recently from the same activity.  Gets short of breath easier as well.   Denies : Dizziness. Leg edema. Nitroglycerin use. Orthopnea. Palpitations. Paroxysmal nocturnal dyspnea. Syncope.    Treated fro high blood pressure and high cholsterol.  She quit smoking 5 years ago after a respiratory infection.   She has an appt for echo coming up.    No prior stress test. Wakes up coughing at times.   Past Medical History:  Diagnosis Date   Arthritis    Hypertension 2005    Past Surgical History:  Procedure Laterality Date   AXILLARY LYMPH NODE BIOPSY     BREAST BIOPSY     BREAST EXCISIONAL BIOPSY Left    CESAREAN SECTION     x 1   DIAGNOSTIC LAPAROSCOPY     for persistant right ovarian cyst     Current Outpatient Medications  Medication Sig Dispense Refill   atorvastatin (LIPITOR) 20 MG tablet Take 1 tablet (20 mg total) by mouth daily. 90 tablet 3   famotidine (PEPCID) 20 MG tablet Take 1 tablet (20 mg total) by mouth 2 (two) times daily. 60 tablet 3   hydrochlorothiazide (HYDRODIURIL) 25 MG tablet Take 1 tablet (25 mg total) by mouth daily. 90 tablet 3   diclofenac Sodium (VOLTAREN) 1 % GEL Apply 4 g topically 4 (four) times daily. (Patient not taking: Reported on 04/24/2021) 100 g 3    triamcinolone (KENALOG) 0.025 % ointment Apply 1 application topically 2 (two) times daily. On top of L foot (Patient not taking: Reported on 04/24/2021) 30 g 0   No current facility-administered medications for this visit.    Allergies:   Patient has no known allergies.    Social History:  The patient  reports that she quit smoking about 4 years ago. Her smoking use included cigarettes. She started smoking about 44 years ago. She has a 9.75 pack-year smoking history. She has never used smokeless tobacco. She reports current alcohol use of about 10.0 standard drinks per week. She reports that she does not use drugs.   Family History:  The patient's family history includes Diabetes Mellitus II in her brother, brother, and mother; Heart disease in her father and mother; Hyperlipidemia in her sister; Hypertension in her brother and mother; Prostate cancer in her father.    ROS:  Please see the history of present illness.   Otherwise, review of systems are positive for chest pain.   All other systems are reviewed and negative.    PHYSICAL EXAM: VS:  BP 140/86    Pulse 86    Ht 5\' 8"  (1.727 m)  Wt 165 lb (74.8 kg)    LMP 04/12/2004    SpO2 98%    BMI 25.09 kg/m  , BMI Body mass index is 25.09 kg/m. GEN: Well nourished, well developed, in no acute distress HEENT: normal Neck: no JVD, carotid bruits, or masses Cardiac: RRR; no murmurs, rubs, or gallops,no edema  Respiratory:  clear to auscultation bilaterally, normal work of breathing GI: soft, nontender, nondistended, + BS MS: no deformity or atrophy Skin: warm and dry, no rash Neuro:  Strength and sensation are intact Psych: euthymic mood, full affect   EKG:   The ekg ordered today demonstrates normal ECG   Recent Labs: 03/23/2021: BUN 15; Creatinine, Ser 0.70; Hemoglobin 12.2; Platelets 385; Potassium 3.9; Sodium 140   Lipid Panel    Component Value Date/Time   CHOL 227 (H) 03/23/2021 1153   TRIG 166 (H) 03/23/2021 1153   HDL  45 03/23/2021 1153   CHOLHDL 5.0 (H) 03/23/2021 1153   CHOLHDL 3.2 10/21/2014 0854   VLDL 15 10/21/2014 0854   LDLCALC 152 (H) 03/23/2021 1153   LDLDIRECT 139 (H) 07/23/2019 1033   LDLDIRECT 110 (H) 01/09/2013 1556     Other studies Reviewed: Additional studies/ records that were reviewed today with results demonstrating: Labs reviewed, LDL was 152.   ASSESSMENT AND PLAN:  Precordial chest pain : Mother had CABG in her 56s.  Brother died from cardiac cause.  CT angiogram of the coronary arteries to help exclude any blockage that may be causing symptoms.  Metoprolol 100 mg x 1 at the time of CTA. DOE/fatigue: Check for CAD.  Echocardiogram will evaluate for any structural heart disease, leaky valves, or weak heart muscle that may explain shortness of breath. HTN: Continue HCTZ.  If she does have a high burden of coronary calcium MR CAD, would add ACE inhibitor. Hyperlipidemia: Lipitor started for LDL 152.  Based on CTA of coronaries, may need to increase to high potency statin.   Current medicines are reviewed at length with the patient today.  The patient concerns regarding her medicines were addressed.  The following changes have been made:  No change  Labs/ tests ordered today include: CTA No orders of the defined types were placed in this encounter.   Recommend 150 minutes/week of aerobic exercise Low fat, low carb, high fiber diet recommended  Disposition:   FU in based on CT A results   Signed, Larae Grooms, MD  04/24/2021 3:08 PM    [facility] [place], [place], [place]  [address] Phone: [phone]; Fax: [phone]

## 2021-04-25 LAB — BASIC METABOLIC PANEL
BUN/Creatinine Ratio: 21 (ref 9–23)
BUN: 18 mg/dL (ref 6–24)
CO2: 24 mmol/L (ref 20–29)
Calcium: 9.8 mg/dL (ref 8.7–10.2)
Chloride: 101 mmol/L (ref 96–106)
Creatinine, Ser: 0.86 mg/dL (ref 0.57–1.00)
Glucose: 101 mg/dL — ABNORMAL HIGH (ref 70–99)
Potassium: 3.4 mmol/L — ABNORMAL LOW (ref 3.5–5.2)
Sodium: 143 mmol/L (ref 134–144)
eGFR: 78 mL/min/{1.73_m2} (ref >59)

## 2021-04-27 ENCOUNTER — Ambulatory Visit (HOSPITAL_COMMUNITY): Payer: BC Managed Care – PPO

## 2021-04-27 ENCOUNTER — Other Ambulatory Visit: Payer: Self-pay

## 2021-04-27 ENCOUNTER — Encounter: Payer: Self-pay | Admitting: Family Medicine

## 2021-04-27 ENCOUNTER — Ambulatory Visit (INDEPENDENT_AMBULATORY_CARE_PROVIDER_SITE_OTHER): Payer: BC Managed Care – PPO | Admitting: Family Medicine

## 2021-04-27 VITALS — BP 130/70 | HR 83 | Ht 68.0 in | Wt 162.0 lb

## 2021-04-27 DIAGNOSIS — I10 Essential (primary) hypertension: Secondary | ICD-10-CM

## 2021-04-27 DIAGNOSIS — Z1239 Encounter for other screening for malignant neoplasm of breast: Secondary | ICD-10-CM

## 2021-04-27 DIAGNOSIS — Z23 Encounter for immunization: Secondary | ICD-10-CM

## 2021-04-27 DIAGNOSIS — Z789 Other specified health status: Secondary | ICD-10-CM

## 2021-04-27 DIAGNOSIS — R7989 Other specified abnormal findings of blood chemistry: Secondary | ICD-10-CM

## 2021-04-27 DIAGNOSIS — E785 Hyperlipidemia, unspecified: Secondary | ICD-10-CM

## 2021-04-27 MED ORDER — SHINGRIX 50 MCG/0.5ML IM SUSR
0.5000 mL | INTRAMUSCULAR | 0 refills | Status: AC
Start: 2021-04-27 — End: 2021-06-27

## 2021-04-27 NOTE — Patient Instructions (Addendum)
It was wonderful to see you today.  Please bring ALL of your medications with you to every visit.   Today we talked about: You are due for your Pap in April--please follow up around this time    I recommend you have repeat pulmonary function testing (lung testing) with Dr. Valentina Lucks--- please schedule a visit at the front   For your foot--try a metatarsal foot pad  You are do for your Shingles vaccine--something to think about to keep you health    Think about cutting back on alcohol--this will help your sleep, cholesterol, and your health!   I recommend you undergo a mammogram.   You can call to schedule an appointment by calling [phone].   Directions [place], [place] [address]  Please let me know if you have questions. I will send you a letter or call you with results.      Thank you for choosing [facility].   Please call [phone] with any questions about today's appointment.  Please be sure to schedule follow up at the front  desk before you leave today.   Dorris Singh, MD  Family Medicine

## 2021-04-27 NOTE — Progress Notes (Signed)
° ° °  SUBJECTIVE:   CHIEF COMPLAINT: follow up  HPI:   [name] is a 61 y.o.  with history notable for hypertension, former tobacco smoker presenting for follow up on blood pressure.  The patient reports improvement in the hypopigmented rash on her left foot with corticosteroid.  She denies worsening or flaking.  She reports she has not yet gotten the shoe inserts for her right foot suspected metatarsalgia.  She has sneakers on today but the inserts are not in place.  The sneakers are Nike's flat soled very new.  The patient reports compliance and adherence to her statin medication she reports complaints of adherence to her antihypertensive medication.  The patient continues to endorse ongoing chest pain and dyspnea on exertion.  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  She denies left neck pain, left arm pain, right arm pain, abdominal pain or leg pain when she exerts herself.  The patient drinks~ 4 beers per night. Reports this relaxes her. Thinking about cutting down, aware of health consequences.   PERTINENT  PMH / PSH/Family/Social History : hypertension, dyslipidemia,   OBJECTIVE:   BP 130/70    Pulse 83    Ht 5\' 8"  (1.727 m)    Wt 162 lb (73.5 kg)    LMP 04/12/2004    SpO2 98%    BMI 24.63 kg/m   Today's weight:  Last Weight  Most recent update: 04/27/2021  8:43 AM    Weight  73.5 kg (162 lb)            Review of prior weights: [employer]   04/27/21 0843  Weight: 162 lb (73.5 kg)    Cardiac: Regular rate and rhythm. Normal S1/S2. No murmurs, rubs, or gallops appreciated. Lungs: Clear bilaterally to ascultation.  Abdomen: Normoactive bowel sounds. No tenderness to deep or light palpation. No rebound or guarding.    Psych: Pleasant and appropriate  Left second digit with improved hyperpigmentation, minimal flaking, no interdigital lesions    ASSESSMENT/PLAN:   Alcohol  use Discussed cessation and reduction. Pre contemplative about quitting. Discussed health consequences. Will continue to address.   Hypertension At goal.    Dyspnea on exertion, with intermittent CP, appreciate Cardiology recommendations. Awaiting CT. Recommend additional evaluation with repeat PFT--last in 2018. She will schedule. Anemia ruled out. Differential also includes primary lung disease (ILD), arrhythmia. CT chest to be scheduled for lung cancer screening.   Elevated ferritin likely due to EtOH, repeat with hepatic panel  Dylipidemia on statin, direct LDL today   HCM  Shingrix printed   She is to call if foot hyperpigmentation does not improve     [name], [place]

## 2021-04-27 NOTE — Assessment & Plan Note (Signed)
At goal.  

## 2021-04-27 NOTE — Assessment & Plan Note (Signed)
Discussed cessation and reduction. Pre contemplative about quitting. Discussed health consequences. Will continue to address.

## 2021-04-28 ENCOUNTER — Telehealth: Admit: 2021-04-28 | Payer: PRIVATE HEALTH INSURANCE | Attending: Cardiovascular Disease | Primary: Internal Medicine

## 2021-04-28 NOTE — Addendum Note (Signed)
Addended by: Ulice Brilliant T on: 04/28/2021 05:00 PM   Modules accepted: Orders

## 2021-04-28 NOTE — Telephone Encounter
Called patient regarding preop for cataract surgery.

## 2021-04-29 ENCOUNTER — Encounter (HOSPITAL_COMMUNITY): Payer: Self-pay

## 2021-04-30 ENCOUNTER — Telehealth: Payer: Self-pay | Admitting: Family Medicine

## 2021-04-30 LAB — HEPATIC FUNCTION PANEL
ALT: 16 IU/L (ref 0–32)
AST: 21 IU/L (ref 0–40)
Albumin: 4.5 g/dL (ref 3.8–4.9)
Alkaline Phosphatase: 108 IU/L (ref 44–121)
Bilirubin Total: 0.3 mg/dL (ref 0.0–1.2)
Bilirubin, Direct: 0.12 mg/dL (ref 0.00–0.40)
Total Protein: 7.2 g/dL (ref 6.0–8.5)

## 2021-04-30 LAB — LDL CHOLESTEROL, DIRECT: LDL Direct: 84 mg/dL (ref 0–99)

## 2021-04-30 LAB — FERRITIN: Ferritin: 326 ng/mL — ABNORMAL HIGH (ref 15–150)

## 2021-04-30 NOTE — Telephone Encounter (Signed)
Attempted to call patient. Attempted to call patient. Reached voicemail, left generic voicemail to call back.   If calls back, results show the following - Normal liver function - Much better cholesterol--she should continue her statin medication, we may need to increase in the future - Her iron level is slightly high, this is likely due to excess alcohol. I am happy to discuss more with her. Recommended cessation at visit  Will send via mychart   Dorris Singh, MD  [facility] Medicine Teaching Service

## 2021-05-05 ENCOUNTER — Telehealth (HOSPITAL_COMMUNITY): Payer: Self-pay | Admitting: Emergency Medicine

## 2021-05-05 ENCOUNTER — Other Ambulatory Visit: Payer: Self-pay

## 2021-05-05 ENCOUNTER — Ambulatory Visit (HOSPITAL_COMMUNITY)
Admission: RE | Admit: 2021-05-05 | Discharge: 2021-05-05 | Disposition: A | Discharge status: Home / Self Care | Payer: BC Managed Care – PPO | Source: Ambulatory Visit | Attending: Family Medicine | Admitting: Family Medicine

## 2021-05-05 DIAGNOSIS — I10 Essential (primary) hypertension: Secondary | ICD-10-CM | POA: Insufficient documentation

## 2021-05-05 DIAGNOSIS — R0609 Other forms of dyspnea: Secondary | ICD-10-CM

## 2021-05-05 DIAGNOSIS — R0600 Dyspnea, unspecified: Secondary | ICD-10-CM | POA: Diagnosis present

## 2021-05-05 LAB — ECHOCARDIOGRAM COMPLETE
Area-P 1/2: 3.53 cm2
Calc EF: 63.1 %
S' Lateral: 3 cm
Single Plane A2C EF: 59.6 %
Single Plane A4C EF: 66.7 %

## 2021-05-05 NOTE — Telephone Encounter (Signed)
Attempted to call patient regarding upcoming cardiac CT appointment. °Left message on voicemail with name and callback number °Daryana Whirley RN Navigator Cardiac Imaging °[facility] °[phone] Office °[phone] Cell ° °

## 2021-05-05 NOTE — Progress Notes (Signed)
°  Echocardiogram 2D Echocardiogram has been performed.  [name] 05/05/2021, 8:50 AM

## 2021-05-06 ENCOUNTER — Telehealth (HOSPITAL_COMMUNITY): Payer: Self-pay | Admitting: Emergency Medicine

## 2021-05-06 ENCOUNTER — Ambulatory Visit (HOSPITAL_COMMUNITY): Payer: BC Managed Care – PPO

## 2021-05-06 ENCOUNTER — Telehealth: Payer: Self-pay | Admitting: Family Medicine

## 2021-05-06 NOTE — Telephone Encounter (Signed)
Attempted to call patient. Reached voicemail, left generic voicemail to call back.  If calls back please let her echocardiogram is normal (good pump function and normal heart valves)--overall good news.  Dorris Singh, MD  Family Medicine Teaching Service

## 2021-05-06 NOTE — Telephone Encounter (Signed)
Reaching out to patient to offer assistance regarding upcoming cardiac imaging study; pt verbalizes understanding of appt date/time, parking situation and where to check in, pre-test NPO status and medications ordered, and verified current allergies; name and call back number provided for further questions should they arise Marchia Bond RN Navigator Cardiac Imaging [facility] [phone] office [phone] cell   Arrival 930 Denies iv issues 100mg  metoprolol tartrate Holding HCTZ

## 2021-05-07 ENCOUNTER — Encounter (HOSPITAL_COMMUNITY): Payer: Self-pay

## 2021-05-07 ENCOUNTER — Other Ambulatory Visit: Payer: Self-pay

## 2021-05-07 ENCOUNTER — Ambulatory Visit (HOSPITAL_COMMUNITY)
Admission: RE | Admit: 2021-05-07 | Discharge: 2021-05-07 | Disposition: A | Discharge status: Home / Self Care | Payer: BC Managed Care – PPO | Source: Ambulatory Visit | Attending: Interventional Cardiology | Admitting: Interventional Cardiology

## 2021-05-07 DIAGNOSIS — R072 Precordial pain: Secondary | ICD-10-CM | POA: Diagnosis present

## 2021-05-07 DIAGNOSIS — I700 Atherosclerosis of aorta: Secondary | ICD-10-CM | POA: Diagnosis not present

## 2021-05-07 MED ORDER — NITROGLYCERIN 0.4 MG SL SUBL
SUBLINGUAL_TABLET | SUBLINGUAL | Status: AC
  Administered 2021-05-07: 0.8 mg via SUBLINGUAL
  Filled 2021-05-07: qty 2

## 2021-05-07 MED ORDER — IOHEXOL 350 MG/ML SOLN
95.0000 mL | Freq: Once | INTRAVENOUS | Status: AC | PRN
  Administered 2021-05-07: 95 mL via INTRAVENOUS

## 2021-05-07 MED ORDER — NITROGLYCERIN 0.4 MG SL SUBL: 0.8000 mg | SUBLINGUAL_TABLET | Freq: Once | SUBLINGUAL | Status: AC

## 2021-05-08 ENCOUNTER — Ambulatory Visit (INDEPENDENT_AMBULATORY_CARE_PROVIDER_SITE_OTHER): Payer: Medicare HMO | Admitting: Pharmacist

## 2021-05-08 ENCOUNTER — Telehealth: Payer: Self-pay | Admitting: Family Medicine

## 2021-05-08 ENCOUNTER — Other Ambulatory Visit: Payer: Self-pay

## 2021-05-08 ENCOUNTER — Encounter: Payer: Self-pay | Admitting: Pharmacist

## 2021-05-08 DIAGNOSIS — R0609 Other forms of dyspnea: Secondary | ICD-10-CM | POA: Diagnosis not present

## 2021-05-08 MED ORDER — HYDROCHLOROTHIAZIDE 25 MG PO TABS: 25.0000 mg | ORAL_TABLET | Freq: Every day | ORAL | 3 refills | Status: DC

## 2021-05-08 MED ORDER — UMECLIDINIUM-VILANTEROL 62.5-25 MCG/ACT IN AEPB: 1.0000 | INHALATION_SPRAY | Freq: Every day | RESPIRATORY_TRACT | 6 refills | Status: DC

## 2021-05-08 NOTE — Progress Notes (Signed)
S:    Patient arrives in good spirits, ambulating without assistance.    Presents for lung function evaluation.  She had previous lung function assessment at the [facility] - 02/28/2017 Patient was referred and last seen by Primary Care Provider, Dr. Owens Shark on 04/27/2021.   Patient reports breathing has been been a problem since she was sick with a "lung virus infection" in 2018.  She quit smoking at that time and has not smoked since.  She reports her dyspnea is significant however it does not limit her in any significant way.  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    Medication adherence reported good.  Takes, blood pressure(HCTZ),  cholesterol (atorvastatin) and famotidine.  Patient has never taken any inhalers for her dyspnea.   O: Physical Exam Constitutional:      Appearance: Normal appearance. She is normal weight.  Pulmonary:     Effort: Pulmonary effort is normal.  Musculoskeletal:        General: No swelling.  Neurological:     Mental Status: She is alert.  Psychiatric:        Mood and Affect: Mood normal.        Behavior: Behavior normal.        Thought Content: Thought content normal.    Review of Systems  Respiratory:  Positive for shortness of breath. Negative for cough, hemoptysis and sputum production.   Cardiovascular:  Positive for palpitations.  Musculoskeletal:  Positive for back pain and joint pain.  All other systems reviewed and are negative.  Vitals:   05/08/21 0918  Pulse: 66  SpO2: 98%    mMRC score= <2 See Documentation Flowsheet - CAT/COPD for complete symptom scoring.  See "scanned report" or Documentation Flowsheet (discrete results - PFTs) for Spirometry results. Patient provided good effort while attempting spirometry.   FVC was similar to results form 02/28/2017 FEV1 decreased from ~ 1.9 L to ~ 1.68L   FEV1/FVC ration decreased from 71 to 26  Lung Age = 79 years, (previously 63 in 2018)   A/P: Patient has been experiencing dyspnea for an extended period of time.  She has a smoking history of > 30 years (previously reports smoking on ~ 6 cigarettes per day).  Quit for > 4 years and states confidence in remaining abstinent from tobacco. 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 Spirometry [facility] based on MMRC score and lack of exacerbations in the last year.  - Trial of ANORO (umeclidinium/vilanterol) ellipta initiated.  Patient educated on purpose, proper use and potential adverse effects of dry mouth/jitteriness.  Following instruction patient verbalized understanding of treatment plan.  Medication Samples have been provided to the patient.  Drug name: Nat Math: 2 - 7 day inhalers (2 week supply)  LOT: [id number]  Exp.Date: 08/10/2022 Dosing instructions: 1 inhalation daily   The patient has been instructed regarding the correct time, dose, and frequency of taking this medication, including desired effects and most common side effects.   [name] 10:20 AM 05/08/2021 - Reassess in 2-4 weeks with Dr. Owens Shark.   Consideration for step down to Incruse (umeclidinium) monotherapy at that time depending on response and tolerability.  Written pt instructions provided.   Total time in face to face  counseling 28 minutes.

## 2021-05-08 NOTE — Assessment & Plan Note (Signed)
Patient has been experiencing dyspnea for an extended period of time.  She has a smoking history of > 30 years (previously reports smoking on ~ 6 cigarettes per day).  Quit for > 4 years and states confidence in remaining abstinent from tobacco. 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 Spirometry [facility] based on MMRC score and lack of exacerbations in the last year.  - Trial of ANORO (umeclidinium/vilanterol) ellipta initiated.  Patient educated on purpose, proper use and potential adverse effects of dry mouth/jitteriness.  Following instruction patient verbalized understanding of treatment plan.  Medication Samples have been provided to the patient. - Reassess in 2-4 weeks with Dr. Owens Shark.   Consideration for step down to Incruse (umeclidinium) monotherapy at that time depending on response and tolerability.

## 2021-05-08 NOTE — Progress Notes (Signed)
Reviewed: I agree with the documentation and management of Dr. Koval. 

## 2021-05-08 NOTE — Telephone Encounter (Signed)
Patient came in asking to see if her doctor could send in her blood pressure medicine to the pharmacy.

## 2021-05-08 NOTE — Patient Instructions (Signed)
Nice to see you again today.   Your lung test showed obstruction or COPD.   Please start one inhalation daily of the [facility].   Please follow-up with Dr. Owens Shark

## 2021-06-08 ENCOUNTER — Other Ambulatory Visit: Payer: Self-pay

## 2021-06-08 ENCOUNTER — Ambulatory Visit
Admission: RE | Admit: 2021-06-08 | Discharge: 2021-06-08 | Disposition: A | Discharge status: Home / Self Care | Payer: BC Managed Care – PPO | Source: Ambulatory Visit | Attending: Family Medicine | Admitting: Family Medicine

## 2021-06-08 ENCOUNTER — Ambulatory Visit: Payer: BC Managed Care – PPO

## 2021-06-08 DIAGNOSIS — Z1239 Encounter for other screening for malignant neoplasm of breast: Secondary | ICD-10-CM

## 2021-06-10 ENCOUNTER — Other Ambulatory Visit: Payer: Self-pay | Admitting: Family Medicine

## 2021-06-10 DIAGNOSIS — R928 Other abnormal and inconclusive findings on diagnostic imaging of breast: Secondary | ICD-10-CM

## 2021-06-16 ENCOUNTER — Encounter
Admit: 2021-06-16 | Payer: PRIVATE HEALTH INSURANCE | Attending: Vascular and Interventional Radiology | Primary: Internal Medicine

## 2021-06-23 ENCOUNTER — Encounter: Admit: 2021-06-23 | Payer: PRIVATE HEALTH INSURANCE | Attending: Cardiovascular Disease | Primary: Internal Medicine
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
## 2021-06-25 ENCOUNTER — Ambulatory Visit
Admission: RE | Admit: 2021-06-25 | Discharge: 2021-06-25 | Disposition: A | Discharge status: Home / Self Care | Payer: BC Managed Care – PPO | Source: Ambulatory Visit | Attending: Family Medicine | Admitting: Family Medicine

## 2021-06-25 ENCOUNTER — Ambulatory Visit
Admission: RE | Admit: 2021-06-25 | Discharge: 2021-06-25 | Disposition: A | Discharge status: Home / Self Care | Payer: Medicare HMO | Source: Ambulatory Visit | Attending: Family Medicine | Admitting: Family Medicine

## 2021-06-25 ENCOUNTER — Other Ambulatory Visit: Payer: Self-pay | Admitting: Family Medicine

## 2021-06-25 DIAGNOSIS — N6320 Unspecified lump in the left breast, unspecified quadrant: Secondary | ICD-10-CM

## 2021-07-24 ENCOUNTER — Encounter: Admit: 2021-07-24 | Payer: PRIVATE HEALTH INSURANCE | Primary: Internal Medicine

## 2021-07-24 ENCOUNTER — Encounter: Admit: 2021-07-24 | Payer: PRIVATE HEALTH INSURANCE | Attending: Cardiovascular Disease | Primary: Internal Medicine

## 2021-07-24 DIAGNOSIS — Z95810 Presence of automatic (implantable) cardiac defibrillator: Secondary | ICD-10-CM

## 2021-09-15 ENCOUNTER — Encounter: Payer: Self-pay | Admitting: *Deleted

## 2021-10-15 ENCOUNTER — Other Ambulatory Visit: Payer: Self-pay

## 2021-10-15 DIAGNOSIS — R1013 Epigastric pain: Secondary | ICD-10-CM

## 2021-10-15 MED ORDER — FAMOTIDINE 20 MG PO TABS: 20.0000 mg | ORAL_TABLET | Freq: Two times a day (BID) | ORAL | 3 refills | Status: DC

## 2021-10-22 ENCOUNTER — Encounter: Admit: 2021-10-22 | Payer: PRIVATE HEALTH INSURANCE | Primary: Internal Medicine

## 2021-10-22 ENCOUNTER — Encounter: Admit: 2021-10-22 | Payer: PRIVATE HEALTH INSURANCE | Attending: Cardiovascular Disease | Primary: Internal Medicine

## 2021-10-22 DIAGNOSIS — Z95810 Presence of automatic (implantable) cardiac defibrillator: Secondary | ICD-10-CM

## 2021-10-28 ENCOUNTER — Encounter: Admit: 2021-10-28 | Payer: PRIVATE HEALTH INSURANCE | Attending: Cardiovascular Disease | Primary: Internal Medicine

## 2021-10-30 ENCOUNTER — Ambulatory Visit (INDEPENDENT_AMBULATORY_CARE_PROVIDER_SITE_OTHER): Payer: BC Managed Care – PPO | Admitting: Family Medicine

## 2021-10-30 ENCOUNTER — Other Ambulatory Visit (HOSPITAL_COMMUNITY)
Admission: RE | Admit: 2021-10-30 | Discharge: 2021-10-30 | Disposition: A | Discharge status: Home / Self Care | Payer: BC Managed Care – PPO | Source: Ambulatory Visit | Attending: Family Medicine | Admitting: Family Medicine

## 2021-10-30 ENCOUNTER — Ambulatory Visit
Admission: RE | Admit: 2021-10-30 | Discharge: 2021-10-30 | Disposition: A | Discharge status: Home / Self Care | Payer: BC Managed Care – PPO | Source: Ambulatory Visit | Attending: Family Medicine | Admitting: Family Medicine

## 2021-10-30 ENCOUNTER — Encounter: Payer: Self-pay | Admitting: Family Medicine

## 2021-10-30 VITALS — BP 148/69 | HR 86 | Wt 163.4 lb

## 2021-10-30 DIAGNOSIS — R21 Rash and other nonspecific skin eruption: Secondary | ICD-10-CM

## 2021-10-30 DIAGNOSIS — I10 Essential (primary) hypertension: Secondary | ICD-10-CM

## 2021-10-30 DIAGNOSIS — G8929 Other chronic pain: Secondary | ICD-10-CM

## 2021-10-30 DIAGNOSIS — M25561 Pain in right knee: Secondary | ICD-10-CM

## 2021-10-30 DIAGNOSIS — Z789 Other specified health status: Secondary | ICD-10-CM

## 2021-10-30 DIAGNOSIS — J449 Chronic obstructive pulmonary disease, unspecified: Secondary | ICD-10-CM

## 2021-10-30 DIAGNOSIS — Z124 Encounter for screening for malignant neoplasm of cervix: Secondary | ICD-10-CM | POA: Diagnosis present

## 2021-10-30 DIAGNOSIS — R7303 Prediabetes: Secondary | ICD-10-CM

## 2021-10-30 DIAGNOSIS — R7989 Other specified abnormal findings of blood chemistry: Secondary | ICD-10-CM

## 2021-10-30 DIAGNOSIS — F1090 Alcohol use, unspecified, uncomplicated: Secondary | ICD-10-CM

## 2021-10-30 DIAGNOSIS — Z87891 Personal history of nicotine dependence: Secondary | ICD-10-CM

## 2021-10-30 LAB — POCT GLYCOSYLATED HEMOGLOBIN (HGB A1C): HbA1c, POC (controlled diabetic range): 6 % (ref 0.0–7.0)

## 2021-10-30 MED ORDER — DICLOFENAC SODIUM 1 % EX GEL: CUTANEOUS | 3 refills | Status: DC

## 2021-10-30 NOTE — Assessment & Plan Note (Signed)
Ferritin, GGT, hepatic panel today. Discussed and recommended cessation, precontemplative. Has cut down a lot to 3 per day. Offered naltrexone, will consider.

## 2021-10-30 NOTE — Assessment & Plan Note (Signed)
Not at goal here. BP at home 122/60. She is to measure home readings--suspect some anxiety related to Pap today.

## 2021-10-30 NOTE — Patient Instructions (Addendum)
It was wonderful to see you today.  Please bring ALL of your medications with you to every visit.   Today we talked about:  I will message you in 2 weeks about your blood pressure - Please measure 3 times per week   For your knee  - I sent in a gel for you to use four times a day - Your will be called by physical therapy   An x-ray was ordered for you---you do not need an appointment to have this completed.  I recommend going to [place]   If the results are normal,I will send you a letter  I will call you with results if anything is abnormal   For your lung CT scan call  [phone]  I have referred you to Dermatology to further evaluate your concern. If you do not received a phone call about this appointment within 2 weeks, please call our office back at [phone]. Jazmin Hartsell coordinates our referrals and can assist you in this.    Please follow up in 3 months   Thank you for choosing [facility].   Please call [phone] with any questions about today's appointment.  Please be sure to schedule follow up at the front  desk before you leave today.   Dorris Singh, MD  Family Medicine

## 2021-10-30 NOTE — Progress Notes (Signed)
SUBJECTIVE:   CHIEF COMPLAINT: Pap smear, check feet  HPI:   [name] is a 61 y.o.  with history notable for obstructive lung disease and HTN presenting for Pap smear and follow up.  The patient reports the following concerns knee pain and foot rash.    She reports a foot rash. This has been ongoing for years now and is worsening. Has tried topical antifungal and corticosteroids without relief.   She endorses ongoing and worsening R knee pain. Has tried Tylenol without relief. Worse with sitting for a long time, particularly in the car. Medially located pain with some crepitus she reports. No locking, catching, popping, falls or prior injury. She thinks she has arthritis.   Reports compliance with BP medications and statin. Using inhaler with significant improvement in dyspnea. Still not smoking (!, congratulated her again), down to 3 beers per day which is significant reduction. Thinking about cutting down more but not sure.   She is due for her Pap today. No discharge or bleeding. Last Pap was NILM and HPV negative.  PERTINENT  PMH / PSH/Family/Social History : HTN, former tobacco use  OBJECTIVE:   BP (!) 148/69   Pulse 86   Wt 163 lb 6.4 oz (74.1 kg)   LMP 04/12/2004   SpO2 100%   BMI 24.84 kg/m   Today's weight:  Last Weight  Most recent update: 10/30/2021  8:52 AM    Weight  74.1 kg (163 lb 6.4 oz)            Review of prior weights: [employer]   10/30/21 0851  Weight: 163 lb 6.4 oz (74.1 kg)     Cardiac: Regular rate and rhythm. Normal S1/S2. No murmurs, rubs, or gallops appreciated. Lungs: Clear bilaterally to ascultation.  Marland Kitchen   Psych: Pleasant and appropriate  GU Exam:    External exam: Normal-appearing female external genitalia.  Vaginal exam notable for normal discharge.  Cervix without discharge or obvious lesion.  Bimanual exam reveals normal sized uterus no masses appreciated, no pain with examination.  Chaperoned examine, CMA D. Blount.   Skin: + small hyperpigmented seborrheic keratosis on face + significant thickened hyperpigmented skin over digits 2-3 on L foot   Knee Musculoskeletal Exam Gait   Gait is normal.  Inspection   Leg length disparity: no discrepancy   Right     Erythema: none       Edema: none     Left     Erythema: none       Edema: none    Palpation   Right     Tenderness comment: medial jointline tenderness on R  Range of Motion   Right     Right knee range of motion is normal.     Left     Left knee range of motion is normal.   + Crepitus on R   ASSESSMENT/PLAN:   Prediabetes A1C improved today, BMP today. Will discuss vitamin D supplementation.   Former heavy tobacco smoker Number given to reschedule CT scan.   Alcohol use Ferritin, GGT, hepatic panel today. Discussed and recommended cessation, precontemplative. Has cut down a lot to 3 per day. Offered naltrexone, will consider.   COPD (chronic obstructive pulmonary disease) ([place]) Diagnosed by PFT. Feeling markedly improved with daily LAMA LABA. Dyspnea is resolved. Continue current therapy.   Hypertension Not at goal here. BP at home 122/60. She is to measure home readings--suspect some anxiety related to Pap today.  HCM Pap smear completed  Foot Rash, question if atopic disease, less likely fungal. Given lack of improvement, referral to Dermatology for consideration of biopsy, alternative cause  Knee Pain, right, likely is OA by exam, also considered patellofemoral syndrome or meniscal injury but less likely by history and exam. Referral to PT, repeat imaging, trial topical NSAIDs. If not improving, consider Sports Medicine.     Dorris Singh, [place]

## 2021-10-30 NOTE — Assessment & Plan Note (Signed)
Diagnosed by PFT. Feeling markedly improved with daily LAMA LABA. Dyspnea is resolved. Continue current therapy.

## 2021-10-30 NOTE — Assessment & Plan Note (Signed)
A1C improved today, BMP today. Will discuss vitamin D supplementation.

## 2021-10-30 NOTE — Assessment & Plan Note (Signed)
Number given to reschedule CT scan.

## 2021-10-31 LAB — BASIC METABOLIC PANEL
BUN/Creatinine Ratio: 24 (ref 12–28)
BUN: 17 mg/dL (ref 8–27)
CO2: 25 mmol/L (ref 20–29)
Calcium: 9.8 mg/dL (ref 8.7–10.3)
Chloride: 102 mmol/L (ref 96–106)
Creatinine, Ser: 0.71 mg/dL (ref 0.57–1.00)
Glucose: 105 mg/dL — ABNORMAL HIGH (ref 70–99)
Potassium: 4.1 mmol/L (ref 3.5–5.2)
Sodium: 141 mmol/L (ref 134–144)
eGFR: 97 mL/min/{1.73_m2} (ref >59)

## 2021-10-31 LAB — HEPATIC FUNCTION PANEL
ALT: 15 IU/L (ref 0–32)
AST: 16 IU/L (ref 0–40)
Albumin: 4.5 g/dL (ref 3.8–4.9)
Alkaline Phosphatase: 93 IU/L (ref 44–121)
Bilirubin Total: 0.3 mg/dL (ref 0.0–1.2)
Bilirubin, Direct: <0.1 mg/dL (ref 0.00–0.40)
Total Protein: 7 g/dL (ref 6.0–8.5)

## 2021-10-31 LAB — GAMMA GT: GGT: 27 IU/L (ref 0–60)

## 2021-10-31 LAB — FERRITIN: Ferritin: 440 ng/mL — ABNORMAL HIGH (ref 15–150)

## 2021-11-02 ENCOUNTER — Other Ambulatory Visit: Payer: Self-pay | Admitting: Family Medicine

## 2021-11-02 DIAGNOSIS — R7989 Other specified abnormal findings of blood chemistry: Secondary | ICD-10-CM

## 2021-11-04 ENCOUNTER — Telehealth: Payer: Self-pay | Admitting: Family Medicine

## 2021-11-04 LAB — CYTOLOGY - PAP
Comment: NEGATIVE
Diagnosis: NEGATIVE
High risk HPV: NEGATIVE

## 2021-11-04 LAB — IRON AND TIBC
Iron Saturation: 39 % (ref 15–55)
Iron: 116 ug/dL (ref 27–159)
Total Iron Binding Capacity: 297 ug/dL (ref 250–450)
UIBC: 181 ug/dL (ref 131–425)

## 2021-11-04 LAB — SPECIMEN STATUS REPORT

## 2021-11-04 NOTE — Telephone Encounter (Signed)
Called with results. TIBC and % sat appropriate. Discussed alcohol cessation. Repeat ferritin and iron panel at follow up. All questoins answered. Will send patient hand out on knee pain.   Dorris Singh, MD  Family Medicine Teaching Service

## 2021-11-11 ENCOUNTER — Encounter: Admit: 2021-11-11 | Payer: PRIVATE HEALTH INSURANCE | Attending: Cardiovascular Disease | Primary: Internal Medicine

## 2021-11-11 DIAGNOSIS — E7800 Pure hypercholesterolemia, unspecified: Secondary | ICD-10-CM

## 2021-11-11 DIAGNOSIS — I499 Cardiac arrhythmia, unspecified: Secondary | ICD-10-CM

## 2021-11-11 DIAGNOSIS — Z789 Other specified health status: Secondary | ICD-10-CM

## 2021-11-11 DIAGNOSIS — E119 Type 2 diabetes mellitus without complications: Secondary | ICD-10-CM

## 2021-11-11 DIAGNOSIS — I5022 Chronic systolic (congestive) heart failure: Secondary | ICD-10-CM

## 2021-11-11 DIAGNOSIS — N390 Urinary tract infection, site not specified: Secondary | ICD-10-CM

## 2021-11-11 DIAGNOSIS — I219 Acute myocardial infarction, unspecified: Secondary | ICD-10-CM

## 2021-11-11 DIAGNOSIS — K592 Neurogenic bowel, not elsewhere classified: Secondary | ICD-10-CM

## 2021-11-11 DIAGNOSIS — Z95810 Presence of automatic (implantable) cardiac defibrillator: Secondary | ICD-10-CM

## 2021-11-11 DIAGNOSIS — I10 Essential (primary) hypertension: Secondary | ICD-10-CM

## 2021-11-11 DIAGNOSIS — A419 Sepsis, unspecified organism: Secondary | ICD-10-CM

## 2021-11-11 DIAGNOSIS — N200 Calculus of kidney: Secondary | ICD-10-CM

## 2021-11-11 DIAGNOSIS — I2510 Atherosclerotic heart disease of native coronary artery without angina pectoris: Secondary | ICD-10-CM

## 2021-11-11 DIAGNOSIS — Z973 Presence of spectacles and contact lenses: Secondary | ICD-10-CM

## 2021-11-11 DIAGNOSIS — Z993 Dependence on wheelchair: Secondary | ICD-10-CM

## 2021-11-11 DIAGNOSIS — T82198S Other mechanical complication of other cardiac electronic device, sequela: Secondary | ICD-10-CM

## 2021-11-11 DIAGNOSIS — S14107A Unspecified injury at C7 level of cervical spinal cord, initial encounter: Secondary | ICD-10-CM

## 2021-11-11 DIAGNOSIS — M009 Pyogenic arthritis, unspecified: Secondary | ICD-10-CM

## 2021-11-11 DIAGNOSIS — I255 Ischemic cardiomyopathy: Secondary | ICD-10-CM

## 2021-11-11 DIAGNOSIS — E785 Hyperlipidemia, unspecified: Secondary | ICD-10-CM

## 2021-11-11 DIAGNOSIS — I472 VT (ventricular tachycardia) (HC Code): Secondary | ICD-10-CM

## 2021-11-11 DIAGNOSIS — L8990 Pressure ulcer of unspecified site, unspecified stage: Secondary | ICD-10-CM

## 2021-11-11 DIAGNOSIS — K219 Gastro-esophageal reflux disease without esophagitis: Secondary | ICD-10-CM

## 2021-11-11 DIAGNOSIS — G8250 Quadriplegia, unspecified: Secondary | ICD-10-CM

## 2021-11-11 DIAGNOSIS — N319 Neuromuscular dysfunction of bladder, unspecified: Secondary | ICD-10-CM

## 2021-11-11 DIAGNOSIS — M869 Osteomyelitis, unspecified: Secondary | ICD-10-CM

## 2021-11-11 DIAGNOSIS — L409 Psoriasis, unspecified: Secondary | ICD-10-CM

## 2021-11-11 DIAGNOSIS — I509 Heart failure, unspecified: Secondary | ICD-10-CM
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
## 2021-11-11 NOTE — Patient Instructions
Future procedures/surgeries by providers other than Dr. Dell Ponto given your cardiac implantable electronic device (CIED) All future procedures/surgeries performed by other providers should be performed under the following conditions: 1) It should be performed in the hospital with cardiac monitoring 2) Magnet should be applied over generator (a.k.a. battery of device) if electrocautery/cautery is used. Alternatively, if necessary, defibrillator function can be temporarily programmed off during the procedure.   3) Cautery/electrocautery should be minimized 4) If surgery/procedure is performed within 6 months of having a device implanted or battery (a.k.a. generator) changed, antibiotics need to be taken as a prophylaxis prior to the procedure. The surgeon should provide the information on this. *IMPORTANT:  If you, your providers, or the surgeons have any questions or concerns about the above instructions or in general, please call Dr. Lilian Coma office.Follow-up1) Follow-up will be in 6 months, unless you feel you need to be seen earlier. Please call  Dr. Donovan Kail secretary Ms. Debbi Kinell at the [facility] location in 1-2 days  to schedule the follow-up appointment at the [facility].

## 2021-11-17 ENCOUNTER — Ambulatory Visit: Payer: BC Managed Care – PPO

## 2021-11-19 ENCOUNTER — Encounter: Admit: 2021-11-19 | Payer: PRIVATE HEALTH INSURANCE | Attending: Cardiovascular Disease | Primary: Internal Medicine

## 2021-12-30 ENCOUNTER — Ambulatory Visit
Admission: RE | Admit: 2021-12-30 | Discharge: 2021-12-30 | Disposition: A | Discharge status: Home / Self Care | Payer: Medicare HMO | Source: Ambulatory Visit | Attending: Family Medicine | Admitting: Family Medicine

## 2021-12-30 DIAGNOSIS — N6320 Unspecified lump in the left breast, unspecified quadrant: Secondary | ICD-10-CM

## 2022-01-06 ENCOUNTER — Telehealth: Admit: 2022-01-06 | Payer: PRIVATE HEALTH INSURANCE | Attending: Cardiovascular Disease | Primary: Internal Medicine

## 2022-01-06 NOTE — Telephone Encounter
Left a message for patient to call the office to schedule a  follow up appointment with Dr. Schoenfeld

## 2022-01-14 ENCOUNTER — Encounter: Admit: 2022-01-14 | Payer: PRIVATE HEALTH INSURANCE | Primary: Internal Medicine

## 2022-01-14 ENCOUNTER — Telehealth: Admit: 2022-01-14 | Payer: PRIVATE HEALTH INSURANCE | Attending: Cardiovascular Disease | Primary: Internal Medicine

## 2022-01-14 NOTE — Telephone Encounter
Left a message for patient to call the office to schedule a  follow up appointment with Dr. Schoenfeld

## 2022-01-20 ENCOUNTER — Encounter: Admit: 2022-01-20 | Payer: PRIVATE HEALTH INSURANCE | Attending: Cardiovascular Disease | Primary: Internal Medicine

## 2022-01-20 ENCOUNTER — Encounter: Admit: 2022-01-20 | Payer: PRIVATE HEALTH INSURANCE | Primary: Internal Medicine

## 2022-01-20 DIAGNOSIS — Z95810 Presence of automatic (implantable) cardiac defibrillator: Secondary | ICD-10-CM

## 2022-01-27 ENCOUNTER — Other Ambulatory Visit: Payer: Self-pay | Admitting: Family Medicine

## 2022-01-27 DIAGNOSIS — R0609 Other forms of dyspnea: Secondary | ICD-10-CM

## 2022-03-15 ENCOUNTER — Other Ambulatory Visit: Payer: Self-pay

## 2022-03-15 DIAGNOSIS — E785 Hyperlipidemia, unspecified: Secondary | ICD-10-CM

## 2022-03-15 MED ORDER — ATORVASTATIN CALCIUM 20 MG PO TABS: 20.0000 mg | ORAL_TABLET | Freq: Every day | ORAL | 3 refills | Status: DC

## 2022-03-25 ENCOUNTER — Encounter: Admit: 2022-03-25 | Payer: PRIVATE HEALTH INSURANCE | Primary: Internal Medicine

## 2022-03-25 ENCOUNTER — Encounter: Admit: 2022-03-25 | Payer: PRIVATE HEALTH INSURANCE | Attending: Cardiovascular Disease | Primary: Internal Medicine

## 2022-03-25 DIAGNOSIS — Z95810 Presence of automatic (implantable) cardiac defibrillator: Secondary | ICD-10-CM

## 2022-03-26 ENCOUNTER — Telehealth: Admit: 2022-03-26 | Payer: PRIVATE HEALTH INSURANCE | Attending: Cardiovascular Disease | Primary: Internal Medicine

## 2022-03-26 ENCOUNTER — Encounter: Admit: 2022-03-26 | Payer: PRIVATE HEALTH INSURANCE | Primary: Internal Medicine

## 2022-03-26 NOTE — Telephone Encounter
Pt will send repeat transmission today to follow up on AF. She denied symptoms, unaware of AF .

## 2022-03-26 NOTE — Progress Notes
Reviewed f/u transmission.AF continues.100% AF burden.Not on oral anticoagulant.Pt is asymptomatic.Battery voltage stable.Lead function (sensing/pacing thresholds/impedance) trends stable and within acceptable range.VP: 0.3%.

## 2022-04-07 ENCOUNTER — Encounter
Admit: 2022-04-07 | Payer: PRIVATE HEALTH INSURANCE | Attending: Vascular and Interventional Radiology | Primary: Internal Medicine

## 2022-04-07 ENCOUNTER — Telehealth
Admit: 2022-04-07 | Payer: PRIVATE HEALTH INSURANCE | Attending: Vascular and Interventional Radiology | Primary: Internal Medicine

## 2022-04-07 DIAGNOSIS — E119 Type 2 diabetes mellitus without complications: Secondary | ICD-10-CM

## 2022-04-07 DIAGNOSIS — K592 Neurogenic bowel, not elsewhere classified: Secondary | ICD-10-CM

## 2022-04-07 DIAGNOSIS — E7800 Pure hypercholesterolemia, unspecified: Secondary | ICD-10-CM

## 2022-04-07 DIAGNOSIS — I219 Acute myocardial infarction, unspecified: Secondary | ICD-10-CM

## 2022-04-07 DIAGNOSIS — N319 Neuromuscular dysfunction of bladder, unspecified: Secondary | ICD-10-CM

## 2022-04-07 DIAGNOSIS — Z95810 Presence of automatic (implantable) cardiac defibrillator: Secondary | ICD-10-CM

## 2022-04-07 DIAGNOSIS — N390 Urinary tract infection, site not specified: Secondary | ICD-10-CM

## 2022-04-07 DIAGNOSIS — L409 Psoriasis, unspecified: Secondary | ICD-10-CM

## 2022-04-07 DIAGNOSIS — I2510 Atherosclerotic heart disease of native coronary artery without angina pectoris: Secondary | ICD-10-CM

## 2022-04-07 DIAGNOSIS — M009 Pyogenic arthritis, unspecified: Secondary | ICD-10-CM

## 2022-04-07 DIAGNOSIS — L8990 Pressure ulcer of unspecified site, unspecified stage: Secondary | ICD-10-CM

## 2022-04-07 DIAGNOSIS — A419 Sepsis, unspecified organism: Secondary | ICD-10-CM

## 2022-04-07 DIAGNOSIS — I499 Cardiac arrhythmia, unspecified: Secondary | ICD-10-CM

## 2022-04-07 DIAGNOSIS — M869 Osteomyelitis, unspecified: Secondary | ICD-10-CM

## 2022-04-07 DIAGNOSIS — N200 Calculus of kidney: Secondary | ICD-10-CM

## 2022-04-07 DIAGNOSIS — S14107A Unspecified injury at C7 level of cervical spinal cord, initial encounter: Secondary | ICD-10-CM

## 2022-04-07 DIAGNOSIS — Z993 Dependence on wheelchair: Secondary | ICD-10-CM

## 2022-04-07 DIAGNOSIS — K219 Gastro-esophageal reflux disease without esophagitis: Secondary | ICD-10-CM

## 2022-04-07 DIAGNOSIS — I509 Heart failure, unspecified: Secondary | ICD-10-CM

## 2022-04-07 DIAGNOSIS — Z789 Other specified health status: Secondary | ICD-10-CM

## 2022-04-07 DIAGNOSIS — Z973 Presence of spectacles and contact lenses: Secondary | ICD-10-CM

## 2022-04-07 DIAGNOSIS — G8250 Quadriplegia, unspecified: Secondary | ICD-10-CM

## 2022-04-07 NOTE — Telephone Encounter
Spoke to Tracy [name] over the phone as she was not able to make it to today's office visit due to lack of transportation. The purpose of today's visit is to definitively rule out Afib and proceed with the appropriate management strategy (ie anticoagulation, rate vs rhythm management planning). Difficult to definitively rule in Afib as she has a single lead device. She will ask her PCP Dr. Ellin Goodie who's clinic is near her home for a possible EKG reading then fax the results to our office. Her CHADSVASC is 5 (gender, CHF hx, HTN, vascular disease, DM) putting her at a high risk for CVA ISO Afib.

## 2022-04-08 ENCOUNTER — Telehealth: Admit: 2022-04-08 | Payer: PRIVATE HEALTH INSURANCE | Primary: Internal Medicine

## 2022-04-08 ENCOUNTER — Encounter
Admit: 2022-04-08 | Payer: PRIVATE HEALTH INSURANCE | Attending: Vascular and Interventional Radiology | Primary: Internal Medicine

## 2022-04-08 DIAGNOSIS — I509 Heart failure, unspecified: Secondary | ICD-10-CM

## 2022-04-08 DIAGNOSIS — G8250 Quadriplegia, unspecified: Secondary | ICD-10-CM

## 2022-04-08 DIAGNOSIS — N390 Urinary tract infection, site not specified: Secondary | ICD-10-CM

## 2022-04-08 DIAGNOSIS — A419 Sepsis, unspecified organism: Secondary | ICD-10-CM

## 2022-04-08 DIAGNOSIS — E119 Type 2 diabetes mellitus without complications: Secondary | ICD-10-CM

## 2022-04-08 DIAGNOSIS — Z789 Other specified health status: Secondary | ICD-10-CM

## 2022-04-08 DIAGNOSIS — K592 Neurogenic bowel, not elsewhere classified: Secondary | ICD-10-CM

## 2022-04-08 DIAGNOSIS — Z973 Presence of spectacles and contact lenses: Secondary | ICD-10-CM

## 2022-04-08 DIAGNOSIS — S14107A Unspecified injury at C7 level of cervical spinal cord, initial encounter: Secondary | ICD-10-CM

## 2022-04-08 DIAGNOSIS — N319 Neuromuscular dysfunction of bladder, unspecified: Secondary | ICD-10-CM

## 2022-04-08 DIAGNOSIS — L409 Psoriasis, unspecified: Secondary | ICD-10-CM

## 2022-04-08 DIAGNOSIS — I4819 Other persistent atrial fibrillation: Secondary | ICD-10-CM

## 2022-04-08 DIAGNOSIS — M009 Pyogenic arthritis, unspecified: Secondary | ICD-10-CM

## 2022-04-08 DIAGNOSIS — N200 Calculus of kidney: Secondary | ICD-10-CM

## 2022-04-08 DIAGNOSIS — Z95810 Presence of automatic (implantable) cardiac defibrillator: Secondary | ICD-10-CM

## 2022-04-08 DIAGNOSIS — I219 Acute myocardial infarction, unspecified: Secondary | ICD-10-CM

## 2022-04-08 DIAGNOSIS — L8990 Pressure ulcer of unspecified site, unspecified stage: Secondary | ICD-10-CM

## 2022-04-08 DIAGNOSIS — M869 Osteomyelitis, unspecified: Secondary | ICD-10-CM

## 2022-04-08 DIAGNOSIS — I499 Cardiac arrhythmia, unspecified: Secondary | ICD-10-CM

## 2022-04-08 DIAGNOSIS — E7800 Pure hypercholesterolemia, unspecified: Secondary | ICD-10-CM

## 2022-04-08 DIAGNOSIS — I2510 Atherosclerotic heart disease of native coronary artery without angina pectoris: Secondary | ICD-10-CM

## 2022-04-08 DIAGNOSIS — Z993 Dependence on wheelchair: Secondary | ICD-10-CM

## 2022-04-08 DIAGNOSIS — K219 Gastro-esophageal reflux disease without esophagitis: Secondary | ICD-10-CM

## 2022-04-08 MED ORDER — APIXABAN 5 MG TABLET
5 mg | ORAL_TABLET | Freq: Two times a day (BID) | ORAL | 12 refills | Status: AC
Start: 2022-04-08 — End: ?

## 2022-04-08 NOTE — Telephone Encounter
Tracy [name] from Dr Arbutus Leas office called reporting EKG was performed at their office and patient is in Afib awaiting for EKG to be faxed over.

## 2022-04-08 NOTE — Telephone Encounter
Called pt-she has appointment at 1 pm for an EKG with PCP. If she needs a monitor after that she would be able to have it placed. Advised pt she would be contacted by this office after the EKG is reviewed.

## 2022-04-08 NOTE — Telephone Encounter
Received faxed EKG from office of Ellin Goodie [name], EKG reviewed by Delsa Bern EP, PA who confirmed patient is in Afib.

## 2022-04-08 NOTE — Progress Notes
Spoke to Mrs Miyasato over the phone to discuss EKG obtained at her PCP's office today to verify Afib. Of note, she was unable to secure a ride to her scheduled OV with me on 12/28 for possible new onset Afib. Needed to obtain an EKG to confirm as she has a single lead ICD. Received EKG via fax from PCP today which appears to be consistent with Afib at a rate of 76bpm. Her CHADSVASC score is 5 (gender, CHF hx, HTN, vascular disease hx, and DM) putting her at an elevated risk for stroke. Will send prescription for eliquis 5mg  BID to her preferred pharmacy this afternoon to be started as soon as possible. She has no notable symptoms a/w Afib. Given this, we discussed management strategies (rate vs rhythm control). She would prefer to be out of Afib as soon as possible. We discussed DCCV and as she has been in Afib not on [facility] for >24 hours, she will need TEE (case request placed). With regards to AAD, class 1c would not be a suitable choice given her CAD hx s/p PCI. She is not amenable to sotalol vs tikosyn at this time. Her OV with me was rescheduled for 04/28/2022, she inquired to see Dr. Dell Ponto which will be scheduled for 04/13/2021 at 1pm

## 2022-04-08 NOTE — Telephone Encounter
Per Raeford Razor APRN  call placed to pt-left message asking her to call back. Calling to see if she can wear a 3 day holter which would be shipped to her home. Pt was unable to come into the office yesterday for an EKG and appointment with Jonny Ruiz.

## 2022-04-09 ENCOUNTER — Telehealth: Admit: 2022-04-09 | Payer: PRIVATE HEALTH INSURANCE | Attending: Cardiovascular Disease | Primary: Internal Medicine

## 2022-04-09 NOTE — Telephone Encounter
[facility]th [facility].Schoenfeld on Tues Jan 3rd

## 2022-04-13 ENCOUNTER — Encounter: Admit: 2022-04-13 | Payer: PRIVATE HEALTH INSURANCE | Attending: Cardiovascular Disease | Primary: Internal Medicine

## 2022-04-13 ENCOUNTER — Ambulatory Visit: Admit: 2022-04-13 | Payer: PRIVATE HEALTH INSURANCE | Attending: Certified Registered" | Primary: Internal Medicine

## 2022-04-13 ENCOUNTER — Encounter: Admit: 2022-04-13 | Payer: PRIVATE HEALTH INSURANCE | Primary: Internal Medicine

## 2022-04-13 DIAGNOSIS — K219 Gastro-esophageal reflux disease without esophagitis: Secondary | ICD-10-CM

## 2022-04-13 DIAGNOSIS — I2510 Atherosclerotic heart disease of native coronary artery without angina pectoris: Secondary | ICD-10-CM

## 2022-04-13 DIAGNOSIS — N390 Urinary tract infection, site not specified: Secondary | ICD-10-CM

## 2022-04-13 DIAGNOSIS — L409 Psoriasis, unspecified: Secondary | ICD-10-CM

## 2022-04-13 DIAGNOSIS — I255 Ischemic cardiomyopathy: Secondary | ICD-10-CM

## 2022-04-13 DIAGNOSIS — M009 Pyogenic arthritis, unspecified: Secondary | ICD-10-CM

## 2022-04-13 DIAGNOSIS — E785 Hyperlipidemia, unspecified: Secondary | ICD-10-CM

## 2022-04-13 DIAGNOSIS — G8250 Quadriplegia, unspecified: Secondary | ICD-10-CM

## 2022-04-13 DIAGNOSIS — Z95810 Presence of automatic (implantable) cardiac defibrillator: Secondary | ICD-10-CM

## 2022-04-13 DIAGNOSIS — I4891 Unspecified atrial fibrillation: Secondary | ICD-10-CM

## 2022-04-13 DIAGNOSIS — S14107A Unspecified injury at C7 level of cervical spinal cord, initial encounter: Secondary | ICD-10-CM

## 2022-04-13 DIAGNOSIS — Z789 Other specified health status: Secondary | ICD-10-CM

## 2022-04-13 DIAGNOSIS — I509 Heart failure, unspecified: Secondary | ICD-10-CM

## 2022-04-13 DIAGNOSIS — K592 Neurogenic bowel, not elsewhere classified: Secondary | ICD-10-CM

## 2022-04-13 DIAGNOSIS — T82198S Other mechanical complication of other cardiac electronic device, sequela: Secondary | ICD-10-CM

## 2022-04-13 DIAGNOSIS — L8990 Pressure ulcer of unspecified site, unspecified stage: Secondary | ICD-10-CM

## 2022-04-13 DIAGNOSIS — N319 Neuromuscular dysfunction of bladder, unspecified: Secondary | ICD-10-CM

## 2022-04-13 DIAGNOSIS — N200 Calculus of kidney: Secondary | ICD-10-CM

## 2022-04-13 DIAGNOSIS — I499 Cardiac arrhythmia, unspecified: Secondary | ICD-10-CM

## 2022-04-13 DIAGNOSIS — E7800 Pure hypercholesterolemia, unspecified: Secondary | ICD-10-CM

## 2022-04-13 DIAGNOSIS — I219 Acute myocardial infarction, unspecified: Secondary | ICD-10-CM

## 2022-04-13 DIAGNOSIS — I472 VT (ventricular tachycardia) (HC Code): Secondary | ICD-10-CM

## 2022-04-13 DIAGNOSIS — I5022 Chronic systolic (congestive) heart failure: Secondary | ICD-10-CM

## 2022-04-13 DIAGNOSIS — A419 Sepsis, unspecified organism: Secondary | ICD-10-CM

## 2022-04-13 DIAGNOSIS — Z973 Presence of spectacles and contact lenses: Secondary | ICD-10-CM

## 2022-04-13 DIAGNOSIS — M869 Osteomyelitis, unspecified: Secondary | ICD-10-CM

## 2022-04-13 DIAGNOSIS — I480 Paroxysmal atrial fibrillation: Secondary | ICD-10-CM

## 2022-04-13 DIAGNOSIS — Z993 Dependence on wheelchair: Secondary | ICD-10-CM

## 2022-04-13 DIAGNOSIS — E119 Type 2 diabetes mellitus without complications: Secondary | ICD-10-CM

## 2022-04-13 NOTE — Patient Instructions
Cardioversions performed by Dr. Dell Ponto PRIOR to the cardioversion please do the following:  - You will have a transesophageal echocardiogram performed prior to the cardioversion - Obtain blood work  prior to the procedure  - Nothing to eat after midnight tonight through the time of the procedure. You can take  water as necessary to take medications  -  Have someone provide transportation for you on the day of the procedure - taking you to and from the hospital. You cannot drive yourself home.  **IMPORTANT: If you have any questions or concerns about the above instructions, or in general, please contact Dr. Lilian Coma office. The nurses should call you to reiterate these instructions. Anticoagulant (Eliquis)If any falls or bleeding, please contact Dr. Lilian Coma office. Future procedures/surgeries by providers other than Dr. Dell Ponto given your cardiac implantable electronic device (CIED) All future procedures/surgeries performed by other providers should be performed under the following conditions: 1) It should be performed in the hospital with cardiac monitoring 2) Magnet should be applied over generator (a.k.a. battery of device) if electrocautery/cautery is used. Alternatively, if necessary, defibrillator function can be temporarily programmed off during the procedure.   3) Cautery/electrocautery should be minimized 4) anticoagulant(Eliquis) should not be stopped for more than 2 days before the surgery, preferably less if possible.5) If surgery/procedure is performed within 6 months of having a device implanted or battery (a.k.a. generator) changed, antibiotics need to be taken as a prophylaxis prior to the procedure. The surgeon should provide the information on this. *IMPORTANT:  If you, your providers, or the surgeons have any questions or concerns about the above instructions or in general, please call Dr. Lilian Coma office.Follow-up: 1) Follow-up will be on 04/28/22 with Raeford Razor, APRN and Dr. Dell Ponto in 4 months , unless you feel you need to be seen earlier.
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
## 2022-04-14 ENCOUNTER — Inpatient Hospital Stay: Admit: 2022-04-14 | Discharge: 2022-04-14 | Payer: PRIVATE HEALTH INSURANCE | Primary: Internal Medicine

## 2022-04-14 ENCOUNTER — Encounter
Admit: 2022-04-14 | Payer: PRIVATE HEALTH INSURANCE | Attending: Student in an Organized Health Care Education/Training Program | Primary: Internal Medicine

## 2022-04-14 ENCOUNTER — Encounter: Admit: 2022-04-14 | Payer: PRIVATE HEALTH INSURANCE | Attending: Cardiovascular Disease | Primary: Internal Medicine

## 2022-04-14 ENCOUNTER — Ambulatory Visit: Admit: 2022-04-14 | Payer: PRIVATE HEALTH INSURANCE | Attending: Certified Registered" | Primary: Internal Medicine

## 2022-04-14 ENCOUNTER — Inpatient Hospital Stay
Admit: 2022-04-14 | Discharge: 2022-04-14 | Payer: PRIVATE HEALTH INSURANCE | Attending: Family | Primary: Internal Medicine

## 2022-04-14 DIAGNOSIS — E119 Type 2 diabetes mellitus without complications: Secondary | ICD-10-CM

## 2022-04-14 DIAGNOSIS — E7800 Pure hypercholesterolemia, unspecified: Secondary | ICD-10-CM

## 2022-04-14 DIAGNOSIS — Z7982 Long term (current) use of aspirin: Secondary | ICD-10-CM

## 2022-04-14 DIAGNOSIS — I2510 Atherosclerotic heart disease of native coronary artery without angina pectoris: Secondary | ICD-10-CM

## 2022-04-14 DIAGNOSIS — I4819 Other persistent atrial fibrillation: Secondary | ICD-10-CM

## 2022-04-14 DIAGNOSIS — K592 Neurogenic bowel, not elsewhere classified: Secondary | ICD-10-CM

## 2022-04-14 DIAGNOSIS — Z7902 Long term (current) use of antithrombotics/antiplatelets: Secondary | ICD-10-CM

## 2022-04-14 DIAGNOSIS — Z880 Allergy status to penicillin: Secondary | ICD-10-CM

## 2022-04-14 DIAGNOSIS — M869 Osteomyelitis, unspecified: Secondary | ICD-10-CM

## 2022-04-14 DIAGNOSIS — Z794 Long term (current) use of insulin: Secondary | ICD-10-CM

## 2022-04-14 DIAGNOSIS — Z882 Allergy status to sulfonamides status: Secondary | ICD-10-CM

## 2022-04-14 DIAGNOSIS — E669 Obesity, unspecified: Secondary | ICD-10-CM

## 2022-04-14 DIAGNOSIS — Z6833 Body mass index (BMI) 33.0-33.9, adult: Secondary | ICD-10-CM

## 2022-04-14 DIAGNOSIS — N319 Neuromuscular dysfunction of bladder, unspecified: Secondary | ICD-10-CM

## 2022-04-14 DIAGNOSIS — I081 Rheumatic disorders of both mitral and tricuspid valves: Secondary | ICD-10-CM

## 2022-04-14 DIAGNOSIS — Z87891 Personal history of nicotine dependence: Secondary | ICD-10-CM

## 2022-04-14 DIAGNOSIS — Z888 Allergy status to other drugs, medicaments and biological substances status: Secondary | ICD-10-CM

## 2022-04-14 DIAGNOSIS — K219 Gastro-esophageal reflux disease without esophagitis: Secondary | ICD-10-CM

## 2022-04-14 DIAGNOSIS — Z79899 Other long term (current) drug therapy: Secondary | ICD-10-CM

## 2022-04-14 DIAGNOSIS — S14107A Unspecified injury at C7 level of cervical spinal cord, initial encounter: Secondary | ICD-10-CM

## 2022-04-14 DIAGNOSIS — I509 Heart failure, unspecified: Secondary | ICD-10-CM

## 2022-04-14 DIAGNOSIS — Z973 Presence of spectacles and contact lenses: Secondary | ICD-10-CM

## 2022-04-14 DIAGNOSIS — I499 Cardiac arrhythmia, unspecified: Secondary | ICD-10-CM

## 2022-04-14 DIAGNOSIS — N390 Urinary tract infection, site not specified: Secondary | ICD-10-CM

## 2022-04-14 DIAGNOSIS — Z881 Allergy status to other antibiotic agents status: Secondary | ICD-10-CM

## 2022-04-14 DIAGNOSIS — G8250 Quadriplegia, unspecified: Secondary | ICD-10-CM

## 2022-04-14 DIAGNOSIS — L8990 Pressure ulcer of unspecified site, unspecified stage: Secondary | ICD-10-CM

## 2022-04-14 DIAGNOSIS — Z955 Presence of coronary angioplasty implant and graft: Secondary | ICD-10-CM

## 2022-04-14 DIAGNOSIS — L409 Psoriasis, unspecified: Secondary | ICD-10-CM

## 2022-04-14 DIAGNOSIS — N200 Calculus of kidney: Secondary | ICD-10-CM

## 2022-04-14 DIAGNOSIS — Z993 Dependence on wheelchair: Secondary | ICD-10-CM

## 2022-04-14 DIAGNOSIS — Z7901 Long term (current) use of anticoagulants: Secondary | ICD-10-CM

## 2022-04-14 DIAGNOSIS — I219 Acute myocardial infarction, unspecified: Secondary | ICD-10-CM

## 2022-04-14 DIAGNOSIS — A419 Sepsis, unspecified organism: Secondary | ICD-10-CM

## 2022-04-14 DIAGNOSIS — Z789 Other specified health status: Secondary | ICD-10-CM

## 2022-04-14 DIAGNOSIS — I4891 Unspecified atrial fibrillation: Secondary | ICD-10-CM

## 2022-04-14 DIAGNOSIS — Z95810 Presence of automatic (implantable) cardiac defibrillator: Secondary | ICD-10-CM

## 2022-04-14 MED ORDER — NOREPINEPHRINE BITARTRATE 1 MG/ML INTRAVENOUS SOLUTION
1 mg/mL | INTRAVENOUS | Status: DC | PRN
Start: 2022-04-14 — End: 2022-04-14
  Administered 2022-04-14: 16:00:00 1 mL/h via INTRAVENOUS

## 2022-04-14 MED ORDER — HYDROCORTISONE 1 % TOPICAL CREAM
1 % | Status: DC
Start: 2022-04-14 — End: 2022-04-14

## 2022-04-14 MED ORDER — HYDROCORTISONE 1 % TOPICAL CREAM
1 % | Freq: Two times a day (BID) | TOPICAL | Status: DC | PRN
Start: 2022-04-14 — End: 2022-04-14
  Administered 2022-04-14: 17:00:00 1 % via TOPICAL

## 2022-04-14 MED ORDER — PROPOFOL 10 MG/ML INTRAVENOUS EMULSION
10 mg/mL | Status: DC | PRN
Start: 2022-04-14 — End: 2022-04-14
  Administered 2022-04-14: 16:00:00 10 mL/h

## 2022-04-14 MED ORDER — LACTATED RINGERS INTRAVENOUS SOLUTION
INTRAVENOUS | Status: DC | PRN
Start: 2022-04-14 — End: 2022-04-14
  Administered 2022-04-14: 16:00:00 via INTRAVENOUS

## 2022-04-14 MED ORDER — PROPOFOL 10 MG/ML INTRAVENOUS EMULSION
10 mg/mL | INTRAVENOUS | Status: DC | PRN
Start: 2022-04-14 — End: 2022-04-14
  Administered 2022-04-14 (×2): 10 mg/mL via INTRAVENOUS

## 2022-04-14 NOTE — H&P
62 yo with MDT ICD initially placed 2006, with fidelis lead capped and new RV lead implanted 2017.  Found by PCP to be in afib.  Here today for TEE/DCCV given interruption in eliquis.PE unchanged from previous visit.Pre-Procedural Assessment for Electrophysiology Study with Moderate Sedation Physician Airway Assessment:Dept of Anesthesia is involved in this procedure and will be responsible for moderate sedation and/or anesthesia during this procedure.History & Physical Attestation: Prior to this procedure I have completed (or reviewed and attested to) a History and Physical Exam written within the last 30 days, which is in the patient record. The patient has been assessed and examined prior to this procedure and no changes were noted unless otherwise documented here: None.Electronically Signed by Astrid Divine, APRN, April 14, 2022

## 2022-04-14 NOTE — Anesthesia Pre-Procedure Evaluation
This is a 62 y.o. female scheduled for CARDIOVERSION.Review of Systems/ Medical HistoryPatient summary, EKG/Cardiac Studies , Labs, pre-procedure vitals, height, weight and NPO status reviewed.No previous anesthesia concernsAnesthesia Evaluation: Estimated body mass index is 33 kg/m? as calculated from the following:  Height as of this encounter: 5' 10 (1.778 m).  Weight as of this encounter: 104.3 kg. CC/HPI: 62 yo woman with new onset AFib scheduled for TEE and possible cardioversion. PMHx otherwise notable for quadriplegia following C7 spinal cord injury with cervical fusion, DM, CAD s/p MI with stents, CHF (LVEF last 35%), ICD, GERD.Past Surgical History:  No date: AMPUTATION; Left    Comment:  toe--5th [id number] and 2017: CARDIAC DEFIBRILLATOR PLACEMENT; LeftNo date: CERVICAL FUSION4/2015 - 2005: CORONARY ANGIOPLASTY WITH STENT PLACEMENT    Comment:  3 stents2016: HIP SURGERY; Left    Comment:  Removal of left hip secondary to osteomyletisNo date: LITHOTRIPSY    Comment:  multipleCardiovascular:Patient has a history of: hypercholesterolemia. -Exercise tolerance: unable to test -Coronary Artery Disease: CAD, MI and cardiac stents -Device(s): ICD-Other Cardiovascular: CHF.    TTE 2018 (OSH):. Neuromuscular: -Comments: C7 injury with quadriplegia.Skeletal/Skin:  -Comments: Cervical fusion.Gastrointestinal/Genitourinary: -Gastrointestinal Disorders:  Patient has GERD.-Nutritional Disorders: Pt is obese per BMI definition-Endocrine/Metabolic: -Diabetes mellitus:  Patient has diabetes mellitus.Physical ExamCardiovascular:    Rhythm: irregularPulmonary:  normal exam  Patient's breath sounds clear to auscultationAirway:  Mallampati: IITM distance: >3 FBNeck ROM: fullMouth Opening: >3cmDental:  Additional findings: Cracked upper left molar. Diffuse plaque. No loose teeth.Anesthesia PlanASA 3 The primary anesthesia plan is  general. GA natural airwayAnesthesia informed consent obtained. Consent obtained from: patientUse of blood products: consented    Consent Comment: General anesthesia and associated risks (including but not limited to possibility of allergic reaction, dental injury, hemodynamic changes) reviewed with patient.  Side effects including nausea and sore throat also discussed. Patient is agreeable to plan. All questions answered.Plan discussed with Attending and CRNA.Anesthesiologist's Pre Op NoteI personally evaluated and examined the patient prior to the intra-operative phase of care on the day of the procedure.Marland Kitchen

## 2022-04-14 NOTE — Anesthesia Procedure Notes
History & Physical Attestation and Pre Procedure Assessment for Moderate Sedation AssessmentHistory & Physical exam: Prior to this procedure I have completed (or reviewed and attested to) a History and Physical Exam written within the last 30 days, which is in the patient record. The patient has been assessed and examined prior to this procedure and no changes were noted unless documented in the following notes.Notes: n/aPhysician Airway Assessment: Dept of Anesthesia is involved in this procedure and will be responsible for moderate sedation and/or anesthesia during this procedure.Procedural Sedation Risk Assessment:Additional risks, if any, related to procedural sedation are noted here: Electronically Signed by Astrid Divine, APRN, April 14, 2022

## 2022-04-14 NOTE — Other
Operative Diagnosis:Pre-op:   new Afib, needs TEE Patient Coded Diagnosis   Pre-op diagnosis: Persistent atrial fibrillation (HC CODE) (HC Code)  Post-op diagnosis: Persistent atrial fibrillation (HC CODE) (HC Code)  Patient Diagnosis   Pre-op diagnosis: new Afib, needs TEE  Post-op diagnosis:     Post-op diagnosis:   * Persistent atrial fibrillation (HC CODE) (HC Code) [I48.19]Operative Procedure(s) :Procedure(s) (LRB):CARDIOVERSION (N/A)Post-op Procedure & Diagnosis ConfirmationPost-op Diagnosis: Post-op Diagnosis updated (see notes)     - Atrial fibrillationPost-op Procedure: Post-op Procedure confirmed (no changes)

## 2022-04-14 NOTE — Anesthesia Pre-Procedure Evaluation
This is a 62 y.o. female scheduled for TEE W COMPLETE DOPPLER AND CFI IF IND IMG ENHANCEMENT AND OR 3D.Review of Systems/ Medical HistoryPatient summary, EKG/Cardiac Studies , Labs and pre-procedure vitals, height, weight reviewed.No previous anesthesia concernsAnesthesia Evaluation: Estimated body mass index is 31.57 kg/m as calculated from the following:  Height as of 04/13/22: 5' 10 (1.778 m).  Weight as of 04/15/21: 99.8 kg. CC/HPI: NOTE: THIS PATIENT HAS NOT YET BEEN SEEN. PREFILLED FROM CHART.62 yo woman with new onset AFib scheduled for TEE and possible cardioversion. PMHx otherwise notable for quadriplegia following C7 spinal cord injury with cervical fusion, DM, CAD s/p MI with stents, CHF (LVEF last 35%), ICD, GERD.Past Surgical History:  No date: AMPUTATION; Left    Comment:  toe--5th [id number] and 2017: CARDIAC DEFIBRILLATOR PLACEMENT; LeftNo date: CERVICAL FUSION4/2015 - 2005: CORONARY ANGIOPLASTY WITH STENT PLACEMENT    Comment:  3 stents2016: HIP SURGERY; Left    Comment:  Removal of left hip secondary to osteomyletisNo date: LITHOTRIPSY    Comment:  multipleCardiovascular:Patient has a history of: hypercholesterolemia. -Exercise tolerance: unable to test -Coronary Artery Disease: CAD, MI and cardiac stents -Device(s): ICD-Other Cardiovascular: CHF.    TTE 2018 (OSH):. Neuromuscular: -Comments: C7 injury with quadriplegia.Skeletal/Skin:  -Comments: Cervical fusion.Gastrointestinal/Genitourinary: -Gastrointestinal Disorders:  Patient has GERD.-Nutritional Disorders: Pt is obese per BMI definition-Endocrine/Metabolic: -Diabetes mellitus:  Patient has diabetes mellitus. Anesthesia PlanASA 3

## 2022-04-14 NOTE — Plan of Care
Plan of Care Overview/ Patient Status

## 2022-04-14 NOTE — Other
Post Anesthesia Transfer of Care NotePatient: Tracy Orion MurphyProcedure(s) Performed: Procedure(s) (LRB):CARDIOVERSION (N/A) Last Vitals:  Vitals Value Taken Time BP 99/54 04/14/22 1126 Temp 97.8 04/14/22 1126 Pulse 68 04/14/22 1126 Resp 16 04/14/22 1126 SpO2 95 04/14/22 1126 POSTOP HANDOFF :      Patient Location:  Phase II     [place] Assessment (score of 8 or greater qualifies pt for phase II location):         Activity:  2 - able to move 4 extremities voluntarily or on command        Respiratory:  2 - able to breathe deeply and cough freely        Circulatory:  2 - blood pressure +/- 20% of pre-anesthetic level        Consciousness:  1 - arousable on calling        O2 Saturation:  2 - able to maintain oxygen saturation > 92% on room air             TOTAL:  9     Level of Consciousness:  Awake     VS stable since last recorded intra-op set? Yes       Oxygen source: room airIntra-operative Intake & Output and Antibiotics as per Anesthesia record and discussed with the RN.

## 2022-04-14 NOTE — Anesthesia Post-Procedure Evaluation
Anesthesia Post-op NotePatient: Tracy Orion MurphyProcedure(s):  Procedure(s) (LRB):CARDIOVERSION (N/A) Last Vitals:  I have reviewed the post-operative vital signs.POSTOP EVALUATION:      Patient Recovery Location:  Phase II     Vital Signs Status:  Stable     Patient Participation:  Patient participated     Mental Status:  Awake     Respiratory Status:  Acceptable     Airway Patency:  Patent     Cardiovascular/Hydration Status:  Stable     Pain Management:  Satisfactory to patient     Nausea/Vomiting Status:  Satisfactory to patientThere were no known notable events for this encounter.

## 2022-04-14 NOTE — Brief Op Note
Electrical Cardioversion Procedure NoteTEE was performed and showed no LAA clot/thrombus.Procedure Date: 1/3/2024Pre-Operative Diagnosis: afibPost-Operative Diagnosis: SRProcedure:	Clinical cytogeneticist:	Astrid Divine NPAnesthesia:	PropofolDrains:	NoneEstimated Blood Loss:  MinimalComplications:	NoneIndications: afibSpecimen removed:  NoneAdditional Studies ordered:  NoneDrains:  None Time out was performed prior to procedure.Procedure:Informed consent was obtained.  The patient was brought to the EP lab in the fasting state.  She received sedation via the anesthesia service. When she had been adequately sedated, she was cardioverted with a single 360J biphasic shock.  This restored sinus rhythm.  The patient awoke spontaneously, and was returned to her room in good condition. Astrid Divine APRNEP [facility]

## 2022-04-14 NOTE — Discharge Instructions
No driving today.You have just received electricity to reset your heart back to a normal rhythm.In the event you experience a burn to the chest or back area you can apply hydrocortisone cream 1% over the counter to the area twice a day x3-5 days or until clear.Any questions please call your physician.

## 2022-04-16 ENCOUNTER — Telehealth: Admit: 2022-04-16 | Payer: PRIVATE HEALTH INSURANCE | Primary: Internal Medicine

## 2022-04-16 NOTE — Telephone Encounter
Call from [facility] nurse-[facility]- [phone]-calling to confirm Asa and Plavix were discontinued by Dr.Schoenfeld(pt reported this to her). Reviewed with Blake Divine that per Dr.Schoenfeld pt was supposed to check with Dr.[name] who [facility] was not familiar with about stopping them.  Provided contact information for DR.[name]'s office.

## 2022-04-21 ENCOUNTER — Encounter: Admit: 2022-04-21 | Payer: PRIVATE HEALTH INSURANCE | Primary: Internal Medicine

## 2022-04-21 ENCOUNTER — Encounter: Admit: 2022-04-21 | Payer: PRIVATE HEALTH INSURANCE | Attending: Cardiovascular Disease | Primary: Internal Medicine

## 2022-04-21 DIAGNOSIS — Z95810 Presence of automatic (implantable) cardiac defibrillator: Secondary | ICD-10-CM

## 2022-04-28 ENCOUNTER — Encounter
Admit: 2022-04-28 | Payer: PRIVATE HEALTH INSURANCE | Attending: Vascular and Interventional Radiology | Primary: Internal Medicine

## 2022-05-03 ENCOUNTER — Ambulatory Visit: Payer: BC Managed Care – PPO | Admitting: Student

## 2022-05-03 VITALS — BP 141/65 | HR 76 | Ht 68.0 in | Wt 161.2 lb

## 2022-05-03 DIAGNOSIS — I10 Essential (primary) hypertension: Secondary | ICD-10-CM

## 2022-05-03 DIAGNOSIS — Z23 Encounter for immunization: Secondary | ICD-10-CM

## 2022-05-03 DIAGNOSIS — M7711 Lateral epicondylitis, right elbow: Secondary | ICD-10-CM

## 2022-05-03 DIAGNOSIS — J449 Chronic obstructive pulmonary disease, unspecified: Secondary | ICD-10-CM

## 2022-05-03 MED ORDER — ANORO ELLIPTA 62.5-25 MCG/ACT IN AEPB
1.0000 | INHALATION_SPRAY | Freq: Every day | RESPIRATORY_TRACT | 3 refills | Status: DC
Start: 2022-05-03 — End: 2022-05-03

## 2022-05-03 MED ORDER — ANORO ELLIPTA 62.5-25 MCG/ACT IN AEPB: 1.0000 | INHALATION_SPRAY | Freq: Every day | RESPIRATORY_TRACT | 3 refills | Status: DC

## 2022-05-03 MED ORDER — HYDROCHLOROTHIAZIDE 25 MG PO TABS: 25.0000 mg | ORAL_TABLET | Freq: Every day | ORAL | 3 refills | Status: DC

## 2022-05-03 NOTE — Assessment & Plan Note (Signed)
Believe patient most likely has lateral epicondylitis on examination and through history.  Discussed symptomatic measures of icing the area and exercises provided for patient on AVS.  Would like to avoid NSAID use given patient history of anemia. -Monitor at future visits -Consider sports medicine referral if still not improved for injection

## 2022-05-03 NOTE — Patient Instructions (Addendum)
It was great to see you! Thank you for allowing me to participate in your care!   Our plans for today:  - I refilled your inhaler and blood pressure medicine  What exercises should I do if I have tennis elbow? If your doctor says it's okay, do the following wrist stretch, finger stretch, and grip exercises daily and the other exercises three times a week, every other day (for example, Mondays, Wednesdays, and Fridays). Also, repeat the downward wrist stretch at the end of your exercise program.  You can do the stretching exercises before you play sports. Don't do the strengthening exercises before you play sports because your muscles could get tired and you could get injured more easily.  It is important that you use smooth and controlled motions. Don't jerk your wrist while doing the exercises. Put ice on your elbow until it is numb after you exercise.  FINGER STRETCH WITH RUBBER BAND Place a rubber band around your thumb and fingers, and slightly cup your hand. Gently spread your thumb and fingers apart then back together. Repeat 10 times for three sets. Do this exercise one or two times a day.  GRIP Hold a soft object (for example, putty or a small rubber ball) in your hand and squeeze the object continuously for 10 to 15 minutes, two or three times a day.  DOWNWARD WRIST STRETCH Hold one arm straight out in front of you, and hold the hand with the other hand. Slowly bend your wrist down (and slightly out) until you feel a stretch. Hold for 15 to 30 seconds, then relax. Repeat two or three times. Do this exercise two or three times a day.  WRIST CURL (PALM UP, PALM DOWN) Lay your forearm flat across a table with your palm facing upward. Place a weight or exercise band across your palm for resistance. Use your other hand to pull the wrist back toward your body. Slowly (over five seconds) move the wrist back to the original position. Repeat this exercise with the palm facing downward while  holding the suspended weight or exercise band. Repeat each exercise 10 times for three sets.  ELBOW CURLS (PALM UP, PALM DOWN) Step out so that one foot is in front of the other. Place one end of an exercise band under your back foot, and hold the other end with your hand using one of two grips. Pull the band up with your hand, and curl your arm toward your shoulder (see drawing 1). You can use a dumbbell or barbell instead of an exercise band.  Repeat the curl exercise 10 times for three sets using both of the following grips: palm facing upward, and palm facing downward.  FOREARM PULL (OPTIONAL) Stand with your knees slightly bent. Hold the weight bar at shoulder level with your palms down and your upper arms close to the sides of your body. Push the weight down then back up (see drawing 2). Repeat 10 times for three sets.   FOREARM TWIST (OPTIONAL) Sit with your forearm supported. Hold a hammer with your palm down. Gently rotate your forearm upward then downward as far as you can before feeling pain (See drawing 3). If rotation is uncomfortable, move your hand closer to the hammer head. Repeat 10 times for three sets. You can use a dumbbell with a weight on one side instead of a hammer.    Take care and seek immediate care sooner if you develop any concerns.  Gerrit Heck, MD

## 2022-05-03 NOTE — Progress Notes (Signed)
    SUBJECTIVE:   CHIEF COMPLAINT / HPI:   COPD Not symptomatic and feels controlled. Needs refill of her Anoro ellipta as her inhaler doses were done today.  No longer smoking since 2019.  HTN At home usually normal when she check - this morning 116/63. Took the last dose of her HCTZ this morning and needs refills.   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  She says she has been having elbow pain recently and has been taking Tylenol as needed for this.  Denies any fevers or systemic symptoms  PERTINENT  PMH / PSH:   OBJECTIVE:   BP (!) 141/65   Pulse 76   Ht '5\' 8"'$  (1.727 m)   Wt 161 lb 4 oz (73.1 kg)   LMP 04/12/2004   SpO2 100%   BMI 24.52 kg/m   General: Well appearing, NAD, awake, alert, responsive to questions Head: Normocephalic atraumatic CV: Regular rate and rhythm no murmurs rubs or gallops Respiratory: Clear to ausculation bilaterally, no wheezes rales or crackles, chest rises symmetrically,  no increased work of breathing Extremities: Moves upper and lower extremities freely, left lateral epicondyle with mild pain on active motion, no erythema or warmth, no significant tenderness to palpation, NVI  ASSESSMENT/PLAN:   Hypertension And says her home readings have been all within normal limits at 116/63 this morning.  She has been adherent to her HCTZ.  She says that every time she comes here it is elevated.  Discussed having her bring in home readings and cuff at next visit in 2 weeks.  Could consider ambulatory blood pressure monitoring next visit if still elevated in office. -HCTZ refilled -2-week follow-up with blood pressure log  COPD (chronic obstructive pulmonary disease) ([place]) Appears stable currently.  Does not have dyspnea on examination or through history. -Anoro Ellipta refilled  Lateral epicondylitis of right elbow Believe patient most likely has lateral epicondylitis on examination  and through history.  Discussed symptomatic measures of icing the area and exercises provided for patient on AVS.  Would like to avoid NSAID use given patient history of anemia. -Monitor at future visits -Consider sports medicine referral if still not improved for injection   Gerrit Heck, MD [place]

## 2022-05-03 NOTE — Assessment & Plan Note (Signed)
And says her home readings have been all within normal limits at 116/63 this morning.  She has been adherent to her HCTZ.  She says that every time she comes here it is elevated.  Discussed having her bring in home readings and cuff at next visit in 2 weeks.  Could consider ambulatory blood pressure monitoring next visit if still elevated in office. -HCTZ refilled -2-week follow-up with blood pressure log

## 2022-05-03 NOTE — Assessment & Plan Note (Signed)
Appears stable currently.  Does not have dyspnea on examination or through history. -Anoro Ellipta refilled

## 2022-05-13 ENCOUNTER — Encounter: Admit: 2022-05-13 | Payer: PRIVATE HEALTH INSURANCE | Attending: Internal Medicine | Primary: Internal Medicine

## 2022-05-13 DIAGNOSIS — L03119 Cellulitis of unspecified part of limb: Secondary | ICD-10-CM

## 2022-05-17 NOTE — Progress Notes (Unsigned)
    SUBJECTIVE:   CHIEF COMPLAINT / HPI:   Hypertension: Patient is a 62 y.o. female who present today for follow up of hypertension.   Patient endorses {rwdmsmartlistproblems:24882}  Home medications include: HCTZ 25 mg Patient endorses taking these medications as prescribed.*** Denies any headache, vision changes, shortness of breath, lower extremity swelling or chest pain   Most recent creatinine trend:  Lab Results  Component Value Date   CREATININE 0.71 10/30/2021   CREATININE 0.86 04/24/2021   CREATININE 0.70 03/23/2021   Patient {rwdoesdoesnot:24881} check blood pressure at home.  Patient {HAS HAS BZM:08022} had a BMP in the past 1 year.  Prediabetes   PERTINENT  PMH / PSH: ***  OBJECTIVE:   LMP 04/12/2004   ***  ASSESSMENT/PLAN:   No problem-specific Assessment & Plan notes found for this encounter.     Gerrit Heck, MD [place]

## 2022-05-18 ENCOUNTER — Other Ambulatory Visit: Payer: Self-pay | Admitting: Family Medicine

## 2022-05-18 ENCOUNTER — Ambulatory Visit (INDEPENDENT_AMBULATORY_CARE_PROVIDER_SITE_OTHER): Payer: BC Managed Care – PPO | Admitting: Student

## 2022-05-18 ENCOUNTER — Encounter: Payer: Self-pay | Admitting: Student

## 2022-05-18 ENCOUNTER — Ambulatory Visit: Payer: Self-pay

## 2022-05-18 VITALS — BP 150/78 | HR 79 | Ht 68.0 in | Wt 161.0 lb

## 2022-05-18 DIAGNOSIS — R7303 Prediabetes: Secondary | ICD-10-CM | POA: Diagnosis not present

## 2022-05-18 DIAGNOSIS — M79672 Pain in left foot: Secondary | ICD-10-CM

## 2022-05-18 DIAGNOSIS — M7711 Lateral epicondylitis, right elbow: Secondary | ICD-10-CM

## 2022-05-18 DIAGNOSIS — I10 Essential (primary) hypertension: Secondary | ICD-10-CM | POA: Diagnosis not present

## 2022-05-18 LAB — POCT GLYCOSYLATED HEMOGLOBIN (HGB A1C): HbA1c, POC (prediabetic range): 6.1 % (ref 5.7–6.4)

## 2022-05-18 NOTE — Patient Instructions (Signed)
It was great to see you! Thank you for allowing me to participate in your care!   I recommend that you always bring your medications to each appointment as this makes it easy to ensure we are on the correct medications and helps [place] not miss when refills are needed.  Our plans for today:  - Please stop at the front desk to set up an appointment for ambulatory BP monitoring with Dr. Valentina Lucks - Referring you to sports medicine for your elbow pain -Referral placed in [facility] [place].  Address: [address] [place], [place] [address] Phone: [phone]  We are checking some labs today, I will call you if they are abnormal will send you a MyChart message or a letter if they are normal.  If you do not hear about your labs in the next 2 weeks please let us know.  Take care and seek immediate care sooner if you develop any concerns. Please remember to show up 15 minutes before your scheduled appointment time!  Gerrit Heck, MD [place]

## 2022-05-18 NOTE — Assessment & Plan Note (Signed)
Continues to have elbow pain.  Would like referral for sports medicine for potential injections. -Sports med referral placed

## 2022-05-18 NOTE — Assessment & Plan Note (Signed)
Still has elevated pressures here in office.  Home blood pressures are very good.  Did not bring home cuff in today. -Pharmacy referral for ambulatory blood pressure monitoring -HCTZ 25 mg daily -BMP, CBC

## 2022-05-18 NOTE — Assessment & Plan Note (Signed)
6 months since last A1c. -Obtain A1c today

## 2022-05-19 LAB — BASIC METABOLIC PANEL
BUN/Creatinine Ratio: 21 (ref 12–28)
BUN: 15 mg/dL (ref 8–27)
CO2: 26 mmol/L (ref 20–29)
Calcium: 9.9 mg/dL (ref 8.7–10.3)
Chloride: 103 mmol/L (ref 96–106)
Creatinine, Ser: 0.72 mg/dL (ref 0.57–1.00)
Glucose: 129 mg/dL — ABNORMAL HIGH (ref 70–99)
Potassium: 3.4 mmol/L — ABNORMAL LOW (ref 3.5–5.2)
Sodium: 144 mmol/L (ref 134–144)
eGFR: 96 mL/min/{1.73_m2} (ref >59)

## 2022-05-19 LAB — CBC
Hematocrit: 35.2 % (ref 34.0–46.6)
Hemoglobin: 11.8 g/dL (ref 11.1–15.9)
MCH: 32.2 pg (ref 26.6–33.0)
MCHC: 33.5 g/dL (ref 31.5–35.7)
MCV: 96 fL (ref 79–97)
Platelets: 349 10*3/uL (ref 150–450)
RBC: 3.66 x10E6/uL — ABNORMAL LOW (ref 3.77–5.28)
RDW: 12.5 % (ref 11.7–15.4)
WBC: 8.5 10*3/uL (ref 3.4–10.8)

## 2022-05-24 ENCOUNTER — Telehealth: Admit: 2022-05-24 | Payer: PRIVATE HEALTH INSURANCE | Attending: Infectious Diseases | Primary: Internal Medicine

## 2022-05-24 NOTE — Telephone Encounter
[facility] MESSAGETime of call:   4:23 PMCaller:   SheilaCaller's relationship to patient:  Self   Calling from (pharmacy, hospital, agency, etc.):  n/a   Reason for call:   Patient called requesting consults  appointment scheduled for February 14,2024 as tele health visits due to weather  please called to advise.If not feeling well, what are symptoms:  n/a   If having symptoms, how long have the symptoms been present:  n/a   Does caller request to speak to someone urgently?  no   If yes, warm transferred to:  n/aProvider:Gleeson, Rodman Key, MDLast clinic visit date: Best telephone number for callback:   [phone] Best time to return call (encourage patient to be available for callback):   Anytime .Permission to leave message:  yes   [facility] Scheduler

## 2022-05-25 ENCOUNTER — Encounter: Admit: 2022-05-25 | Payer: PRIVATE HEALTH INSURANCE | Primary: Internal Medicine

## 2022-05-25 ENCOUNTER — Other Ambulatory Visit: Payer: Self-pay | Admitting: Family Medicine

## 2022-05-25 DIAGNOSIS — Z1231 Encounter for screening mammogram for malignant neoplasm of breast: Secondary | ICD-10-CM

## 2022-05-25 NOTE — Telephone Encounter
Called patient in regards to appointment  request to convert  Consult appointment to tele health  left voicemail . Ok to convert to tele health per office.

## 2022-05-26 ENCOUNTER — Ambulatory Visit: Admit: 2022-05-26 | Payer: PRIVATE HEALTH INSURANCE | Attending: Infectious Diseases | Primary: Internal Medicine

## 2022-05-26 ENCOUNTER — Telehealth: Admit: 2022-05-26 | Payer: PRIVATE HEALTH INSURANCE | Attending: Infectious Diseases | Primary: Internal Medicine

## 2022-05-26 DIAGNOSIS — M869 Osteomyelitis, unspecified: Secondary | ICD-10-CM

## 2022-05-26 DIAGNOSIS — L03119 Cellulitis of unspecified part of limb: Secondary | ICD-10-CM
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
## 2022-05-26 NOTE — Telephone Encounter
L/M to call office regarding today's appointment.Appointment should be F2F please do not cancel appointment without speaking to Dr.Gleeson.

## 2022-06-07 ENCOUNTER — Ambulatory Visit: Payer: Medicare HMO | Admitting: Pharmacist

## 2022-06-10 ENCOUNTER — Telehealth: Admit: 2022-06-10 | Payer: PRIVATE HEALTH INSURANCE | Attending: Infectious Diseases | Primary: Internal Medicine

## 2022-06-10 NOTE — Telephone Encounter
Faxed last visit note from Dr. Vanessa Ralphs to [phone].

## 2022-06-10 NOTE — Telephone Encounter
[facility] MESSAGETime of call:   11:30 AMCaller:   [place] Caller's relationship to patient:  n/a  Calling from [employer], hospital, agency, etc.):  Dr. Arbutus Leas office   Reason for call:   Referring office called to request Dr. Georgie Chard note from 05/26/22, thanks! Fax [phone] not feeling well, what are symptoms:  n/a   If having symptoms, how long have the symptoms been present:  n/a   Does caller request to speak to someone urgently?  no   If yes, warm transferred to:  n/aProvider:  Dr. Cordella Register clinic visit date:  2/14/24Best telephone number for callback:  [phone] time to return call (encourage patient to be available for callback):   Anytime Permission to leave message:  yes   [facility] Referral Specialist

## 2022-06-11 ENCOUNTER — Telehealth: Admit: 2022-06-11 | Payer: PRIVATE HEALTH INSURANCE | Attending: Infectious Diseases | Primary: Internal Medicine

## 2022-06-11 MED ORDER — DOXYCYCLINE HYCLATE 100 MG TABLET
100 mg | Freq: Two times a day (BID) | ORAL | Status: AC
Start: 2022-06-11 — End: 2022-06-11

## 2022-06-11 MED ORDER — DOXYCYCLINE HYCLATE 100 MG TABLET
100 mg | ORAL_TABLET | Freq: Two times a day (BID) | ORAL | 1 refills | Status: AC
Start: 2022-06-11 — End: ?

## 2022-06-11 NOTE — Telephone Encounter
L/M to call office regarding [place] scan on 06/14/22 @2 :25pm on [address] In [place].

## 2022-06-11 NOTE — Telephone Encounter
patient stated that dr Vanessa Ralphs would refill her doxycycline can [facility] send to [facility] [address] [place]

## 2022-06-14 ENCOUNTER — Ambulatory Visit: Admit: 2022-06-14 | Payer: PRIVATE HEALTH INSURANCE | Primary: Internal Medicine

## 2022-06-14 LAB — C-REACTIVE PROTEIN     (CRP): C-REACTIVE PROTEIN: 5.2 mg/L (ref <8.0)

## 2022-06-14 LAB — BLOOD CULTURE

## 2022-06-15 ENCOUNTER — Encounter: Payer: Self-pay | Admitting: Pharmacist

## 2022-06-15 ENCOUNTER — Ambulatory Visit (INDEPENDENT_AMBULATORY_CARE_PROVIDER_SITE_OTHER): Payer: Medicare HMO | Admitting: Pharmacist

## 2022-06-15 VITALS — BP 146/77 | HR 79 | Wt 161.4 lb

## 2022-06-15 DIAGNOSIS — I10 Essential (primary) hypertension: Secondary | ICD-10-CM

## 2022-06-15 NOTE — Progress Notes (Signed)
Reviewed and agree with Dr Graylin Shiver plan.

## 2022-06-15 NOTE — Assessment & Plan Note (Signed)
History of hypertension diagnosed in 2013 currently taking hydrochlorothiazide 25 mg once daily with goal systolic blood pressure of <130. Patient has a recent history of elevated blood pressure readings in office. States she is adherent to her home medication. Blood pressure in clinic today is above goal. - Continue hydrochlorothiazide 25 mg once daily - Initiate 24-hour continuous blood pressure monitoring - Plan for patient to return in 1 day to review results from blood pressure monitor

## 2022-06-15 NOTE — Progress Notes (Signed)
   S:     Chief Complaint  Patient presents with   Medication Management    Amb BP Evaluation   62 y.o. female who presents for hypertension evaluation, education, and management.  PMH is significant for COPD.  Patient was referred and last seen by Primary Care Provider, Dr. Jinny Sanders, on 05/18/2022.   At last visit, blood pressure was elevated in office. Patient monitors blood pressure at home and reports pressures at home are at goal.  Patient endorses taking all home medications as prescribed. Denies headaches or vision changes.  Diagnosed with Hypertension in the year of 2013.    Discussed procedure for wearing the monitor and gave patient written instructions. Monitor was placed on non-dominant arm with instructions to return in the morning.   Current BP Medications include:  Hydrodiuril (hydrochlorothiazide) 25 mg once daily  Antihypertensives tried in the past include: lisinopril, amlodipine   O:  Review of Systems  All other systems reviewed and are negative.   Physical Exam Constitutional:      Appearance: Normal appearance. She is normal weight.  Neurological:     Mental Status: She is alert.  Psychiatric:        Mood and Affect: Mood normal.        Behavior: Behavior normal.        Thought Content: Thought content normal.     Last 3 Office BP readings: BP Readings from Last 3 Encounters:  06/15/22 (!) 146/77  05/18/22 (!) 150/78  05/03/22 (!) 141/65    Clinical Atherosclerotic Cardiovascular Disease (ASCVD): No  The 10-year ASCVD risk score (Arnett DK, et al., 2019) is: 23.9%   Values used to calculate the score:     Age: 65 years     Sex: Female     Is Non-Hispanic African American: Yes     Diabetic: No     Tobacco smoker: Yes     Systolic Blood Pressure: 123456 mmHg     Is BP treated: Yes     HDL Cholesterol: 45 mg/dL     Total Cholesterol: 227 mg/dL  Basic Metabolic Panel    Component Value Date/Time   NA 144 05/18/2022 1705   K 3.4 (L)  05/18/2022 1705   CL 103 05/18/2022 1705   CO2 26 05/18/2022 1705   GLUCOSE 129 (H) 05/18/2022 1705   GLUCOSE 142 (H) 12/21/2016 1146   BUN 15 05/18/2022 1705   CREATININE 0.72 05/18/2022 1705   CREATININE 0.71 10/21/2014 0854   CALCIUM 9.9 05/18/2022 1705   GFRNONAA 84 02/11/2020 1125   GFRNONAA >89 10/21/2014 0854   GFRAA 97 02/11/2020 1125   GFRAA >89 10/21/2014 0854     ABPM Study Data: Arm Placement left arm  A/P: History of hypertension diagnosed in 2013 currently taking hydrochlorothiazide 25 mg once daily with goal systolic blood pressure of <130. Patient has a recent history of elevated blood pressure readings in office. States she is adherent to her home medication. Blood pressure in clinic today is above goal. - Continue hydrochlorothiazide 25 mg once daily - Initiate 24-hour continuous blood pressure monitoring - Plan for patient to return in 1 day to review results from blood pressure monitor  Written patient instructions provided. Patient verbalized understanding of treatment plan.  Total time in face to face counseling 30 minutes.  Follow-up:  Pharmacist in 1 day Patient seen with Louanne Belton PharmD PGY-1 Pharmacy Resident and Estelle June, PharmD Candidate.

## 2022-06-15 NOTE — Patient Instructions (Signed)
Blood Pressure Activity Diary Time Lying down/ Sleeping Walking/ Exercise Stressed/ Angry Headache/ Pain Dizzy  9 AM       10 AM       11 AM       12 PM       1 PM       2 PM       Time Lying down/ Sleeping Walking/ Exercise Stressed/ Angry Headache/ Pain Dizzy  3 PM       4 PM        5 PM       6 PM       7 PM       8 PM       Time Lying down/ Sleeping Walking/ Exercise Stressed/ Angry Headache/ Pain Dizzy  9 PM       10 PM       11 PM       12 AM       1 AM       2 AM       3 AM       Time Lying down/ Sleeping Walking/ Exercise Stressed/ Angry Headache/ Pain Dizzy  4 AM       5 AM       6 AM       7 AM       8 AM       9 AM       10 AM        Time you woke up: _________                  Time you went to sleep:__________  Come back tomorrow at 8:30 to have the monitor removed Call the [facility] if you have any questions before then [phone])  Wearing the Blood Pressure Monitor The cuff will inflate every 20 minutes during the day and every 30 minutes while you sleep. Your blood pressure readings will NOT display after cuff inflation Fill out the blood pressure-activity diary during the day, especially during activities that may affect your reading -- such as exercise, stress, walking, taking your blood pressure medications  Important things to know: Avoid taking the monitor off for the next 24 hours, unless it causes you discomfort or pain. Do NOT get the monitor wet and do NOT dry to clean the monitor with any cleaning products. Do NOT put the monitor on anyone else's arm. When the cuff inflates, avoid excess movement. Let the cuffed arm hang loosely, slightly away from the body. Avoid flexing the muscles or moving the hand/fingers. When you go to sleep, make sure that the hose is not kinked. Remember to fill out the blood pressure activity diary. If you experience severe pain or unusual pain (not associated with getting your blood pressure  checked), remove the monitor.  Troubleshooting:  Code  Troubleshooting   1  Check cuff position, tighten cuff   2, 3  Remain still during reading   4, 87  Check air hose connections and make sure cuff is tight   85, 89  Check hose connections and make tubing is not crimped   86  Push START/STOP to restart reading   88, 91  Retry by pushing START/STOP   90  Replace batteries. If problem persists, remove monitor and bring back to   clinic at follow up   97, 98, 99  Service required - Remove monitor and bring back to clinic at  follow up

## 2022-06-16 ENCOUNTER — Ambulatory Visit (INDEPENDENT_AMBULATORY_CARE_PROVIDER_SITE_OTHER): Payer: Medicare HMO | Admitting: Pharmacist

## 2022-06-16 DIAGNOSIS — I10 Essential (primary) hypertension: Secondary | ICD-10-CM

## 2022-06-16 NOTE — Progress Notes (Signed)
Reviewed and agree with Dr Graylin Shiver plan.

## 2022-06-16 NOTE — Progress Notes (Signed)
   S:     Chief Complaint  Patient presents with   Medication Management    Blood Pressure Follow-Up   62 y.o. female who presents for hypertension evaluation, education, and management.  Diagnosed with Hypertension in the year of 2013.    Medication compliance is reported to be adherent.  Current BP Medications include:  HCTZ '25mg'$   Antihypertensives tried in the past include: Lisinopril    Day #2 - Patient returns to clinic and reports they were able to wear the Ambulatory Blood Pressure Cuff for the entire 24 evaluation period.   O:  Review of Systems  All other systems reviewed and are negative.   Physical Exam Constitutional:      Appearance: Normal appearance.  Neurological:     Mental Status: She is alert.  Psychiatric:        Mood and Affect: Mood normal.        Behavior: Behavior normal.        Thought Content: Thought content normal.     Last 3 Office BP readings: BP Readings from Last 3 Encounters:  06/15/22 (!) 146/77  05/18/22 (!) 150/78  05/03/22 (!) 123XX123    Basic Metabolic Panel    Component Value Date/Time   NA 144 05/18/2022 1705   K 3.4 (L) 05/18/2022 1705   CL 103 05/18/2022 1705   CO2 26 05/18/2022 1705   GLUCOSE 129 (H) 05/18/2022 1705   GLUCOSE 142 (H) 12/21/2016 1146   BUN 15 05/18/2022 1705   CREATININE 0.72 05/18/2022 1705   CREATININE 0.71 10/21/2014 0854   CALCIUM 9.9 05/18/2022 1705   GFRNONAA 84 02/11/2020 1125   GFRNONAA >89 10/21/2014 0854   GFRAA 97 02/11/2020 1125   GFRAA >89 10/21/2014 0854    Renal function: UACR was ordered   ABPM Study Data: Arm Placement left arm  Overall Mean 24hr BP:   132/69 mmHg  HR: 83 Daytime Mean BP:  142/74 mmHg  HR: 84  Nighttime Mean BP:  113/58 mmHg  HR: 81  Dipping Pattern: Yes.    Sys:20.4% Dia:21.3%   [normal dipping ~10-20%]  For Office Goal Goal BP of <130/80:  ABPM thresholds: Overall BP < 125/75, daytime BP <130/80 mmHg, sleeptime BP <110/65 mmHg    A/P: History of  hypertension for > 10 years currently taking HCTZ '25mg'$  daily with goal presssure of <130/80.  Found to have isolated systolic hypertension and persistently elevated systolic of ~ 123456 and well controlled diastolic blood pressure of ~ 69mHg with 24-hour ambulatory blood pressure evaluation.  Average AWAKE blood pressure of 142/74 mmHg.  Nocturnal dipping pattern is normal.   Changes to medications -Continued HCTZ '25mg'$  -UACR obtained today Results reviewed and written information provided.    Follow-up for tomorrow 3/7:  Communicate results of UACR by telephone after 2:00PM and plan of action with regards to antihypertensive treatment (ARB or CCB).  Patient verbalized understanding of treatment plan.  Total time in face to face counseling 20 minutes.   Patient seen with JEstelle June PharmD Candidate.

## 2022-06-16 NOTE — Patient Instructions (Signed)
Urine evaluation today.   Plan to call you tomorrow 3/7 after 2:00 PM and discuss results and medication plan.

## 2022-06-16 NOTE — Assessment & Plan Note (Signed)
History of hypertension for > 10 years currently taking HCTZ '25mg'$  daily with goal presssure of <130/80.  Found to have isolated systolic hypertension and persistently elevated systolic of ~ 123456 and well controlled diastolic blood pressure of ~ 3mHg with 24-hour ambulatory blood pressure evaluation.  Average AWAKE blood pressure of 142/74 mmHg.  Nocturnal dipping pattern is normal.   Changes to medications -Continued HCTZ '25mg'$  -UACR obtained today Results reviewed and written information provided.    Follow-up for tomorrow 3/7:  Communicate results of UACR by telephone after 2:00PM and plan of action with regards to antihypertensive treatment (ARB or CCB).

## 2022-06-18 ENCOUNTER — Telehealth: Payer: Self-pay | Admitting: Pharmacist

## 2022-06-18 DIAGNOSIS — I10 Essential (primary) hypertension: Secondary | ICD-10-CM

## 2022-06-18 LAB — MICROALBUMIN / CREATININE URINE RATIO
Creatinine, Urine: 117.6 mg/dL
Microalb/Creat Ratio: 5 mg/g creat (ref 0–29)
Microalbumin, Urine: 6.2 ug/mL

## 2022-06-18 MED ORDER — AMLODIPINE BESYLATE 2.5 MG PO TABS: 2.5000 mg | ORAL_TABLET | Freq: Every day | ORAL | 3 refills | Status: DC

## 2022-06-18 NOTE — Telephone Encounter (Signed)
Contacted patient with follow-up result of UACR and hypertension therapy adjustment - change.   I shared with her that her urine protein evaluation was good with minimal amount of protein in her urine.   Initiated 2.'5mg'$  amlodipine once daily.  Patient educated on purpose, proper use and potential adverse effects of peripheral edema.  Following instruction patient verbalized understanding of treatment plan. New precription sent to local pharmacy.   Follow-up blood pressure check scheduled for 3/25 - Rx Clinic.

## 2022-06-21 NOTE — Telephone Encounter (Signed)
Reviewed and agree with Dr Koval's plan.   

## 2022-06-25 ENCOUNTER — Encounter: Admit: 2022-06-25 | Payer: PRIVATE HEALTH INSURANCE | Primary: Internal Medicine

## 2022-06-25 ENCOUNTER — Encounter: Admit: 2022-06-25 | Payer: PRIVATE HEALTH INSURANCE | Attending: Cardiovascular Disease | Primary: Internal Medicine

## 2022-06-25 DIAGNOSIS — Z95810 Presence of automatic (implantable) cardiac defibrillator: Secondary | ICD-10-CM

## 2022-07-05 ENCOUNTER — Encounter: Payer: Self-pay | Admitting: Pharmacist

## 2022-07-05 ENCOUNTER — Ambulatory Visit (INDEPENDENT_AMBULATORY_CARE_PROVIDER_SITE_OTHER): Payer: Medicare HMO | Admitting: Pharmacist

## 2022-07-05 VITALS — BP 132/73 | HR 77 | Wt 159.6 lb

## 2022-07-05 DIAGNOSIS — R1013 Epigastric pain: Secondary | ICD-10-CM | POA: Diagnosis not present

## 2022-07-05 DIAGNOSIS — I10 Essential (primary) hypertension: Secondary | ICD-10-CM | POA: Diagnosis not present

## 2022-07-05 MED ORDER — FAMOTIDINE 20 MG PO TABS: 20.0000 mg | ORAL_TABLET | Freq: Two times a day (BID) | ORAL | 3 refills | Status: DC

## 2022-07-05 NOTE — Progress Notes (Signed)
   S:     Chief Complaint  Patient presents with   Medication Management    Primary hypertension   62 y.o. female who presents for hypertension evaluation, education, and management.  PMH is significant for HTN.  Patient was referred and last seen by Primary Care Provider, Dr. Jinny Sanders, on 05/18/22.  At last visit, HCTZ 25 mg was continued and patient brought log of home BP readings which were elevated.   Today, patient arrives in good spirits and presents without assistance. Denies dizziness, headache, blurred vision, swelling.   Patient reports hypertension was diagnosed in 2013.   Patient is adherent to medications.  Current antihypertensives include: Amlodipine 2.5 mg, HCTZ 25 mg  Antihypertensives tried in the past include: None  Reported home BP readings from 3/10-3/25: Morning readings range from [id number] systolic and [id number] diastolic; afternoon readings range from 0000000 systolic and 70 diastolic.   O:  Review of Systems  All other systems reviewed and are negative.   Physical Exam Constitutional:      Appearance: Normal appearance.  Neurological:     Mental Status: She is alert.  Psychiatric:        Mood and Affect: Mood normal.        Behavior: Behavior normal.        Thought Content: Thought content normal.        Judgment: Judgment normal.     Last 3 Office BP readings: BP Readings from Last 3 Encounters:  07/05/22 132/73  06/15/22 (!) 146/77  05/18/22 (!) 150/78    BMET    Component Value Date/Time   NA 144 05/18/2022 1705   K 3.4 (L) 05/18/2022 1705   CL 103 05/18/2022 1705   CO2 26 05/18/2022 1705   GLUCOSE 129 (H) 05/18/2022 1705   GLUCOSE 142 (H) 12/21/2016 1146   BUN 15 05/18/2022 1705   CREATININE 0.72 05/18/2022 1705   CREATININE 0.71 10/21/2014 0854   CALCIUM 9.9 05/18/2022 1705   GFRNONAA 84 02/11/2020 1125   GFRNONAA >89 10/21/2014 0854   GFRAA 97 02/11/2020 1125   GFRAA >89 10/21/2014 0854    Clinical ASCVD: No The 10-year  ASCVD risk score (Arnett DK, et al., 2019) is: 18.5%   Values used to calculate the score:     Age: 80 years     Sex: Female     Is Non-Hispanic African American: Yes     Diabetic: No     Tobacco smoker: Yes     Systolic Blood Pressure: [id number] mmHg     Is BP treated: Yes     HDL Cholesterol: 45 mg/dL     Total Cholesterol: 227 mg/dL   A/P: Hypertension diagnosed 2013 and currently on medications. BP goal < 130/80 mmHg. Patient is adherent to medications. BP is partially controlled due to elevated home readings and slightly elevated in-office systolic reading  -Continued amlodipine 2.5 mg daily -Continued HCTZ 25 mg daily -Encouraged patient to check BP at home and bring log of readings to next visit. Counseled on proper use of home BP cuff.   Dyspepsia - controlled on famotidine 20mg  - Refill provided  Results reviewed and written information provided.    Written patient instructions provided. Patient verbalized understanding of treatment plan.  Total time in face to face counseling 20 minutes.    Patient seen with Estelle June, PharmD Candidate.

## 2022-07-05 NOTE — Assessment & Plan Note (Signed)
Hypertension diagnosed 2013 and currently on medications. BP goal < 130/80 mmHg. Patient is adherent to medications. BP is partially controlled due to elevated home readings and slightly elevated in-office systolic reading  -Continued amlodipine 2.5 mg daily -Continued HCTZ 25 mg daily -Encouraged patient to check BP at home and bring log of readings to next visit. Counseled on proper use of home BP cuff.

## 2022-07-05 NOTE — Progress Notes (Signed)
Reviewed and agree with Dr Koval's plan.   

## 2022-07-05 NOTE — Patient Instructions (Addendum)
It was nice seeing you today! Keep up the good work!

## 2022-07-07 ENCOUNTER — Ambulatory Visit
Admission: RE | Admit: 2022-07-07 | Discharge: 2022-07-07 | Disposition: A | Discharge status: Home / Self Care | Payer: BC Managed Care – PPO | Source: Ambulatory Visit | Attending: Family Medicine | Admitting: Family Medicine

## 2022-07-07 DIAGNOSIS — Z1231 Encounter for screening mammogram for malignant neoplasm of breast: Secondary | ICD-10-CM

## 2022-07-08 ENCOUNTER — Encounter: Admit: 2022-07-08 | Payer: PRIVATE HEALTH INSURANCE | Attending: Medical | Primary: Internal Medicine

## 2022-07-08 ENCOUNTER — Encounter: Payer: Self-pay | Admitting: Family Medicine

## 2022-07-08 MED ORDER — DOXYCYCLINE HYCLATE 100 MG TABLET
100 mg | ORAL_TABLET | 1 refills | Status: AC
Start: 2022-07-08 — End: ?

## 2022-08-05 ENCOUNTER — Encounter: Admit: 2022-08-05 | Payer: PRIVATE HEALTH INSURANCE | Attending: Medical | Primary: Internal Medicine

## 2022-08-05 MED ORDER — DOXYCYCLINE HYCLATE 100 MG TABLET
100 mg | ORAL_TABLET | 1 refills | Status: AC
Start: 2022-08-05 — End: ?

## 2022-08-31 ENCOUNTER — Encounter: Admit: 2022-08-31 | Payer: PRIVATE HEALTH INSURANCE | Attending: Cardiovascular Disease | Primary: Internal Medicine

## 2022-08-31 DIAGNOSIS — I255 Ischemic cardiomyopathy: Secondary | ICD-10-CM

## 2022-08-31 DIAGNOSIS — E785 Hyperlipidemia, unspecified: Secondary | ICD-10-CM

## 2022-08-31 DIAGNOSIS — E119 Type 2 diabetes mellitus without complications: Secondary | ICD-10-CM

## 2022-08-31 DIAGNOSIS — M869 Osteomyelitis, unspecified: Secondary | ICD-10-CM

## 2022-08-31 DIAGNOSIS — I472 VT (ventricular tachycardia) (HC Code): Secondary | ICD-10-CM

## 2022-08-31 DIAGNOSIS — Z95810 Presence of automatic (implantable) cardiac defibrillator: Secondary | ICD-10-CM

## 2022-08-31 DIAGNOSIS — T82198S Other mechanical complication of other cardiac electronic device, sequela: Secondary | ICD-10-CM

## 2022-08-31 DIAGNOSIS — I5022 Chronic systolic (congestive) heart failure: Secondary | ICD-10-CM

## 2022-09-10 ENCOUNTER — Encounter: Admit: 2022-09-10 | Payer: PRIVATE HEALTH INSURANCE | Attending: Internal Medicine | Primary: Internal Medicine

## 2022-09-10 ENCOUNTER — Telehealth: Admit: 2022-09-10 | Payer: PRIVATE HEALTH INSURANCE | Attending: Infectious Diseases | Primary: Internal Medicine

## 2022-09-10 MED ORDER — DOXYCYCLINE HYCLATE 100 MG TABLET
100 mg | ORAL_TABLET | Freq: Two times a day (BID) | ORAL | 1 refills | Status: AC
Start: 2022-09-10 — End: ?

## 2022-09-10 NOTE — Telephone Encounter
Patient requesting a refill for doxy.[facility] [address]

## 2022-09-24 ENCOUNTER — Ambulatory Visit: Admit: 2022-09-24 | Payer: PRIVATE HEALTH INSURANCE | Primary: Internal Medicine

## 2022-09-29 ENCOUNTER — Other Ambulatory Visit: Payer: Self-pay | Admitting: Student

## 2022-09-29 DIAGNOSIS — J449 Chronic obstructive pulmonary disease, unspecified: Secondary | ICD-10-CM

## 2022-10-01 ENCOUNTER — Telehealth: Admit: 2022-10-01 | Payer: PRIVATE HEALTH INSURANCE | Attending: Infectious Diseases | Primary: Internal Medicine

## 2022-10-01 ENCOUNTER — Encounter: Admit: 2022-10-01 | Payer: PRIVATE HEALTH INSURANCE | Attending: Infectious Diseases | Primary: Internal Medicine

## 2022-10-01 DIAGNOSIS — M25422 Effusion, left elbow: Secondary | ICD-10-CM

## 2022-10-01 NOTE — Unmapped
Received call from pt stating that she is scheduled for a [place] scan on July 3rd but can't do the contrast it causes rashes on her please call [phone]

## 2022-10-01 NOTE — Unmapped
Patient stated she has reactions from contrast dye which causes her rashes. She will not call [place] scan department to schedule until sure of kind of test needed. Will forward to Dr. Vanessa Ralphs.

## 2022-10-06 ENCOUNTER — Encounter: Admit: 2022-10-06 | Payer: PRIVATE HEALTH INSURANCE | Attending: Infectious Diseases | Primary: Internal Medicine

## 2022-10-06 MED ORDER — DOXYCYCLINE HYCLATE 100 MG TABLET
100 mg | ORAL_TABLET | Freq: Two times a day (BID) | ORAL | 1 refills | Status: AC
Start: 2022-10-06 — End: ?

## 2022-10-07 ENCOUNTER — Ambulatory Visit: Admit: 2022-10-07 | Payer: PRIVATE HEALTH INSURANCE | Primary: Internal Medicine

## 2022-10-07 NOTE — Unmapped
Called patient to inform that [place] scan order was change to Non contrast and that she needs to follow-up with Radiology to reschedule. Patient agreed with the plan. She also requested for Doxycycline refill in which refill request was sent to provider. Addendum from Dr. Vanessa Ralphs. [name], MD  [name], RN; [name]; [name], JuliseCaller: Unspecified (3 days ago, 12:34 PM)Can we please see if we can get this scan done before Friday, and maybe get her an appointment to see me before Friday?I prescribed the doxy back in February - she never followed up with me or with her imaging, I really have no sense of what is going on with her clinically at this point. I really think she needs to have the scan and be seen (in person) ASAP. I would rather see if we can get all this done this week before just refilling her medication, especially since she has not been following up consistently. I am adding Tracy [name] and Tracy [name] on this to see if they can help expedite things.[name] RN Notes :Tracy [name]. Family member answered ? Mom.Informed to have Tracy [name] scan scheduled as soon as possible and get an appointment with Dr. Vanessa Ralphs.Doxycycline refill will be determine after procedure.June 27,2024 Hi Tracy [name],I put in a refill for doxy for the pt - I just gave her enough to get her to the appointment on 10/19/22. She really needs to keep that appointment (and the [place] appointment) because I am not comfortable refilling the medication beyond that without seeing her, since it has been so long since her last appointment.Can you let her know when you are able?Thanks,[name] RN Notes :Informed patient of antibiotics sent by provider. Dose until July 9th only. Emphasized importance of medications and to keep [place] scan appt. Patient verbalized  education.

## 2022-10-07 NOTE — Unmapped
SOCIAL WORK NOTEPatient Name: Tracy Nissley MurphyMedical Record Number: [id number] Date of Birth: [birth year]Social Work Follow Up  Flowsheet Row Most Recent Value Admission Information  Document Type Progress Note Reason for Current Social Work Involvement Resources Visitor Restriction in Place No Intervention Assessment and referral to community resources  Assessment and Referral to [employer] Source of Information Patient Record Reviewed Yes Level of Care Ambulatory What medium(s) of communication were used with patient/family/caregiver? Telephone Identified Clinical/Disposition, Issues/Barriers: Transportation Intervention(s)/Summary 15 minutes spent via case.  Per consulted with Tracy Mao, RN patient will need transportation for her Radiology appointment on tomorrow.   I spoke with Tracy [name] who stated she is going through a lot of personal issues right now.  Tracy [name] stated that she will not be able to make the appointment on tomorrow, because she does not have transportation.  She also stated she is in a wheelchair and her Zenaida Niece that she uses for daily transportation is being repaired and not sure how much longer it will take to get repaired. Tracy [name] stated her finance' is in ICU(this just happened earlier this week and not sure if he is going to make it or not).  She  also stated that she tried to use a handivan(wheelchair transportation), but that will cost her $500 each way and she can't afford that amount.  Tracy [name] stated this is just not a good time for her to come in and she know for a fact that she will not be able to make the appointment tomorrow.  Tracy [name] stated unfortunately she was told that if she didn't come in tomorrow she will not be able to get her medications and that is stressing her out also.  Tracy [name] stated if the doctors can work with her until next week, she will try other means to get here.  However, she stated with everything that is going on right now in her personal life, she is very overwhelmed and just can't deal with it, it's too much for her.  I Informed Tracy [name] to reach out to her insurance company and see if they have transportation resources that she may benefit from.  She's aware that at this time, I can't provide her with any transportation resources.  I will inform Wynnett, RN of patient's current situation.  No other social work assistance requested at this time. Collaboration with Treatment Team/Community Providers/Family: Scientist, product/process development, RN Referrals/Resources Provided: None Outcome Resolved Handoff Required? No Next Steps/Plan (including hand-off): Tracy [name] will follow up with me as needed.  Social work remains available. Signature: Gerlene Burdock, BSW Contact Information: [phone]

## 2022-10-08 ENCOUNTER — Ambulatory Visit: Admit: 2022-10-08 | Payer: PRIVATE HEALTH INSURANCE | Primary: Internal Medicine

## 2022-10-13 ENCOUNTER — Ambulatory Visit: Admit: 2022-10-13 | Payer: PRIVATE HEALTH INSURANCE | Primary: Internal Medicine

## 2022-10-13 ENCOUNTER — Other Ambulatory Visit: Payer: Self-pay | Admitting: Internal Medicine

## 2022-10-14 LAB — COMPLETE METABOLIC PANEL WITH GFR
AG Ratio: 1.7 (calc) (ref 1.0–2.5)
ALT: 15 U/L (ref 6–29)
AST: 18 U/L (ref 10–35)
Albumin: 4.7 g/dL (ref 3.6–5.1)
Alkaline phosphatase (APISO): 84 U/L (ref 37–153)
BUN: 15 mg/dL (ref 7–25)
CO2: 27 mmol/L (ref 20–32)
Calcium: 10.1 mg/dL (ref 8.6–10.4)
Chloride: 100 mmol/L (ref 98–110)
Creat: 0.75 mg/dL (ref 0.50–1.05)
Globulin: 2.8 g/dL (calc) (ref 1.9–3.7)
Glucose, Bld: 103 mg/dL — ABNORMAL HIGH (ref 65–99)
Potassium: 3.6 mmol/L (ref 3.5–5.3)
Sodium: 140 mmol/L (ref 135–146)
Total Bilirubin: 0.3 mg/dL (ref 0.2–1.2)
Total Protein: 7.5 g/dL (ref 6.1–8.1)
eGFR: 91 mL/min/{1.73_m2} (ref >=60)

## 2022-10-14 LAB — CBC
HCT: 37 % (ref 35.0–45.0)
Hemoglobin: 12.4 g/dL (ref 11.7–15.5)
MCH: 32.5 pg (ref 27.0–33.0)
MCHC: 33.5 g/dL (ref 32.0–36.0)
MCV: 96.9 fL (ref 80.0–100.0)
MPV: 9.4 fL (ref 7.5–12.5)
Platelets: 335 10*3/uL (ref 140–400)
RBC: 3.82 10*6/uL (ref 3.80–5.10)
RDW: 12.2 % (ref 11.0–15.0)
WBC: 6.9 10*3/uL (ref 3.8–10.8)

## 2022-10-14 LAB — LIPID PANEL
Cholesterol: 175 mg/dL (ref <200)
HDL: 59 mg/dL (ref >=50)
LDL Cholesterol (Calc): 83 mg/dL (calc)
Non-HDL Cholesterol (Calc): 116 mg/dL (calc) (ref <130)
Total CHOL/HDL Ratio: 3 (calc) (ref <5.0)
Triglycerides: 236 mg/dL — ABNORMAL HIGH (ref <150)

## 2022-10-14 LAB — VITAMIN D 25 HYDROXY (VIT D DEFICIENCY, FRACTURES): Vit D, 25-Hydroxy: 21 ng/mL — ABNORMAL LOW (ref 30–100)

## 2022-10-14 LAB — TSH: TSH: 0.95 mIU/L (ref 0.40–4.50)

## 2022-10-15 ENCOUNTER — Telehealth: Admit: 2022-10-15 | Payer: PRIVATE HEALTH INSURANCE | Attending: Infectious Diseases | Primary: Internal Medicine

## 2022-10-15 NOTE — Telephone Encounter
Scheduled Tracy [name] for 7/16 @ [facility]. When I called to inform the patient, she stated her boyfriend had a heart attack and is being taken off life support today. So, she wishes to not make any further appointments at this time. However, I did tentatively schedule a f/u for her on 8/6 w/ Dr. Vanessa Ralphs.

## 2022-10-19 ENCOUNTER — Ambulatory Visit: Admit: 2022-10-19 | Payer: PRIVATE HEALTH INSURANCE | Attending: Infectious Diseases | Primary: Internal Medicine

## 2022-10-22 ENCOUNTER — Encounter: Admit: 2022-10-22 | Payer: PRIVATE HEALTH INSURANCE | Attending: Cardiovascular Disease | Primary: Internal Medicine

## 2022-10-22 ENCOUNTER — Encounter: Admit: 2022-10-22 | Payer: PRIVATE HEALTH INSURANCE | Primary: Internal Medicine

## 2022-10-22 DIAGNOSIS — Z95810 Presence of automatic (implantable) cardiac defibrillator: Secondary | ICD-10-CM

## 2022-10-26 ENCOUNTER — Ambulatory Visit: Admit: 2022-10-26 | Payer: PRIVATE HEALTH INSURANCE | Primary: Internal Medicine

## 2022-10-28 ENCOUNTER — Telehealth: Payer: Self-pay | Admitting: Family Medicine

## 2022-10-28 NOTE — Telephone Encounter (Signed)
Received paperwork for housing accomodation. Unsure specific need for this.   Front Team- please call patient and schedule for visit to discuss. Okay to be virtual (with me or PGY2/3) to discuss.   Thank you Terisa Starr, MD  [facility] Medicine Teaching Service

## 2022-11-01 NOTE — Telephone Encounter (Signed)
Called and scheduled patient appointment to discuss paper work   Thanks [employer]

## 2022-11-11 ENCOUNTER — Telehealth: Admit: 2022-11-11 | Payer: PRIVATE HEALTH INSURANCE | Primary: Internal Medicine

## 2022-11-11 NOTE — Telephone Encounter
Pt came in with enterococcus faecalis uti in setting of stones, got stent, got antibitoics, was to go home but then impaired mental status, echo density on TTE but may be chronic, they wanted MRI of head to evaluate mental status, Pt was equivocating re TEE prior to altered mentation. I indicated that old lead may not be MRI compatible though new one and new generator are. If needs to be done could consider programming to asynchronous at 80bpm with monitoring during procedureMark Denton Ar, MDProfessor of MedicineYale UAL Corporation of MedicineReceived message from on call service Dr Johny Blamer called from [facility] Requesting information on AICD device MRI compatibilityPlease advise Thank you

## 2022-11-16 ENCOUNTER — Ambulatory Visit: Admit: 2022-11-16 | Payer: PRIVATE HEALTH INSURANCE | Attending: Infectious Diseases | Primary: Internal Medicine

## 2022-11-24 NOTE — Progress Notes (Signed)
    SUBJECTIVE:   CHIEF COMPLAINT: housing accomodation HPI:   [name] is a 62 y.o.  with history notable for HTN and obstructive lung disease presenting for form discussion.   PERTINENT  PMH / PSH/Family/Social History : ***  OBJECTIVE:   LMP 04/12/2004   Today's weight:  Review of prior weights: There were no vitals filed for this visit.  ***  ASSESSMENT/PLAN:   No problem-specific Assessment & Plan notes found for this encounter.     {    This will disappear when note is signed, click to select method of visit    :1}  Terisa Starr, MD  Family Medicine Teaching Service  [facility]

## 2022-11-25 ENCOUNTER — Ambulatory Visit (INDEPENDENT_AMBULATORY_CARE_PROVIDER_SITE_OTHER): Payer: Medicare HMO | Admitting: Family Medicine

## 2022-11-25 ENCOUNTER — Other Ambulatory Visit: Payer: Self-pay

## 2022-11-25 ENCOUNTER — Encounter: Payer: Self-pay | Admitting: Family Medicine

## 2022-11-25 VITALS — BP 148/63 | HR 95 | Ht 68.0 in | Wt 161.6 lb

## 2022-11-25 DIAGNOSIS — J449 Chronic obstructive pulmonary disease, unspecified: Secondary | ICD-10-CM

## 2022-11-25 DIAGNOSIS — Z23 Encounter for immunization: Secondary | ICD-10-CM

## 2022-11-25 DIAGNOSIS — M25521 Pain in right elbow: Secondary | ICD-10-CM

## 2022-11-25 DIAGNOSIS — I10 Essential (primary) hypertension: Secondary | ICD-10-CM

## 2022-11-25 DIAGNOSIS — M7711 Lateral epicondylitis, right elbow: Secondary | ICD-10-CM

## 2022-11-25 DIAGNOSIS — Z87891 Personal history of nicotine dependence: Secondary | ICD-10-CM | POA: Diagnosis not present

## 2022-11-25 MED ORDER — TRIAMCINOLONE ACETONIDE 0.5 % EX OINT
1.0000 | TOPICAL_OINTMENT | Freq: Two times a day (BID) | CUTANEOUS | 1 refills | Status: AC
Start: 2022-11-25 — End: ?

## 2022-11-25 MED ORDER — ALBUTEROL SULFATE HFA 108 (90 BASE) MCG/ACT IN AERS
2.0000 | INHALATION_SPRAY | Freq: Four times a day (QID) | RESPIRATORY_TRACT | 0 refills | Status: DC | PRN
Start: 2022-11-25 — End: 2023-01-31

## 2022-11-25 MED ORDER — DICLOFENAC SODIUM 2 % EX SOLN
2.0000 | Freq: Two times a day (BID) | CUTANEOUS | 3 refills | Status: DC | PRN
Start: 2022-11-25 — End: 2023-06-16

## 2022-11-25 MED ORDER — SHINGRIX 50 MCG/0.5ML IM SUSR
0.5000 mL | INTRAMUSCULAR | 0 refills | Status: AC
Start: 2022-11-25 — End: 2023-01-25

## 2022-11-25 NOTE — Assessment & Plan Note (Signed)
Doing well at home  She will bring her home cuff to follow up to validate readings

## 2022-11-25 NOTE — Assessment & Plan Note (Signed)
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

## 2022-11-25 NOTE — Patient Instructions (Addendum)
It was wonderful to see you today.  Please bring ALL of your medications with you to every visit.   Today we talked about:  I filled out your form for your COPD  I sent in an as needed inhaler called Albuterol to use for coughing  You will be called about your low dose CT scan   Bring in your blood pressure cuff from home at your next visit  We reached out the [facility] for biopsy results  I sent in a cream to apply twice a day   For your elbow I do think this is tennis elbow I recommend Physical Therapy I have also referred you to Sports Medicine for possible ultrasound I sent in a gel to apply twice   You are due for A Shingles vaccine I recommend going to your pharmacy to get this I have sent them a prescription  You can go any time   Please follow up in 3 months   Thank you for choosing [facility].   Please call [phone] with any questions about today's appointment.  Please be sure to schedule follow up at the front  desk before you leave today.   Terisa Starr, MD  Family Medicine

## 2022-11-25 NOTE — Assessment & Plan Note (Signed)
Likely cause of paroxysms of coughing Low dose CT ordered Unlikely due to medication side effect Rx albuterol Given limitations in exertion due to severe dyspnea requiring her to stop--form for first floor apartment completed

## 2022-12-01 ENCOUNTER — Encounter: Admit: 2022-12-01 | Payer: PRIVATE HEALTH INSURANCE | Primary: Internal Medicine

## 2022-12-03 ENCOUNTER — Ambulatory Visit (HOSPITAL_COMMUNITY)
Admission: RE | Admit: 2022-12-03 | Discharge: 2022-12-03 | Disposition: A | Discharge status: Home / Self Care | Payer: BC Managed Care – PPO | Source: Ambulatory Visit | Attending: Family Medicine | Admitting: Family Medicine

## 2022-12-03 DIAGNOSIS — Z87891 Personal history of nicotine dependence: Secondary | ICD-10-CM | POA: Insufficient documentation

## 2022-12-08 ENCOUNTER — Ambulatory Visit (INDEPENDENT_AMBULATORY_CARE_PROVIDER_SITE_OTHER): Payer: BC Managed Care – PPO | Admitting: Sports Medicine

## 2022-12-08 VITALS — BP 138/74 | Ht 68.0 in | Wt 160.0 lb

## 2022-12-08 DIAGNOSIS — M7711 Lateral epicondylitis, right elbow: Secondary | ICD-10-CM

## 2022-12-08 MED ORDER — MELOXICAM 15 MG PO TABS: 15.0000 mg | ORAL_TABLET | Freq: Every day | ORAL | 2 refills | Status: DC

## 2022-12-08 MED ORDER — NITROGLYCERIN 0.2 MG/HR TD PT24: MEDICATED_PATCH | TRANSDERMAL | 1 refills | Status: DC

## 2022-12-08 NOTE — Progress Notes (Signed)
PCP: Westley Chandler, MD  SUBJECTIVE:   HPI:  Patient is a 62 y.o. female here with chief complaint of right lateral elbow pain.  She states the pain has been present for over 6 months.  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  Pain locates just superior to the lateral epicondyle and radiates down the posterior aspect of the forearm to the dorsum of the hand.  She denies any numbness or tingling.  Pain is worse with repetitive wrist extension.  She has tried Tylenol with minimal relief.  She was referred over to the sports medicine office as she has not improved with conservative management.  ROS:     See HPI  PERTINENT  PMH / PSH FH / / SH:  Past Medical, Surgical, Social, and Family History Reviewed & Updated in the EMR.  Pertinent findings include:  Arthritis, Hypertension  No Known Allergies   OBJECTIVE:  BP 138/74   Ht 5\' 8"  (1.727 m)   Wt 160 lb (72.6 kg)   LMP 04/12/2004   BMI 24.33 kg/m   PHYSICAL EXAM:  GEN: Alert and Oriented, NAD, comfortable in exam room RESP: Unlabored respirations, symmetric chest rise PSY: normal mood, congruent affect   MSK EXAM: Right elbow without deformity, erythema, ecchymosis, atrophy or swelling. She has full range of motion of the elbow in extension, flexion, supination and pronation.  Right wrist and digits also with full range of motion. Strength 5 out of 5 throughout. Reproducible tenderness to palpation over the lateral epicondyle and the proximal portion of the common extensor tendon. Reproduced pain at lateral epicondyle with resisted wrist and finger extension.   ASSESSMENT & PLAN:  1. Chronic lateral epicondylitis of right elbow History and exam most consistent with exacerbation of chronic lateral epicondylitis though differential also includes radial tunnel syndrome.  No significant radicular symptoms.  She has failed to improve with conservative therapy and home  exercises.  Given that corticosteroid injections do not have great data supporting long-term benefit we discussed trialing formal physical therapy which she is amenable to.  With the chronicity of this tendinitis we will incorporate the nitroglycerin protocol into her therapy.  I reviewed the side effects to monitor for with nitroglycerin use such as hypotension, headaches, adhesive skin irritation.  I also recommended relative rest, focusing on wrist flexion lifting rather than lifting with wrist extension.  Prescription sent for once daily meloxicam 15 mg advised to avoid other NSAIDs while taking.  Follow-up in 6 weeks and can consider ECSWT at subsequent visits or corticosteroid injection if patient is doing very poorly from a pain perspective.   Glean Salen, MD PGY-4, Sports Medicine Fellow [facility]  Addendum:  Patient seen in the office by fellow.  His history, exam, plan of care were precepted with me.  Norton Blizzard MD Marrianne Mood

## 2022-12-08 NOTE — Patient Instructions (Addendum)
You have lateral epicondylitis Try to avoid painful activities as much as possible. Ice the area 3-4 times a day for 15 minutes at a time. Tylenol 1000mg  three times daily and Meloxicam 15 mg once daily as needed for pain Counterforce brace as directed can help unload area - wear this regularly if it provides you with relief. Hammer rotation exercise, wrist extension exercise with 1 pound weight - 3 sets of 10 once a day.   Stretching - hold for 20-30 seconds and repeat 3 times. We referred you to physical therapy started the nitroglycerin protocol. Can consider shock wave therapy or injections in the future if not improving. Follow up in 6 weeks.  Nitroglycerin Protocol Apply 1/4 nitroglycerin patch to affected area daily. Change position of patch within the affected area every 24 hours. You may experience a headache during the first 1-2 weeks of using the patch, these should subside. If you experience headaches after beginning nitroglycerin patch treatment, you may take your preferred over the counter pain reliever. Another side effect of the nitroglycerin patch is skin irritation or rash related to patch adhesive. Please notify our office if you develop more severe headaches or rash, and stop the patch. Tendon healing with nitroglycerin patch may require 12 to 24 weeks depending on the extent of injury. Men should not use if taking Viagra, Cialis, or Levitra.  Do not use if you have migraines or rosacea.

## 2022-12-13 ENCOUNTER — Other Ambulatory Visit: Payer: Self-pay | Admitting: Family Medicine

## 2022-12-13 DIAGNOSIS — R1013 Epigastric pain: Secondary | ICD-10-CM

## 2022-12-14 ENCOUNTER — Telehealth: Payer: Self-pay | Admitting: Family Medicine

## 2022-12-14 NOTE — Telephone Encounter (Signed)
Called with CT results, repeat in 1 year. Discussed atherosclerosis and emphysema.  She is already on a statin. Terisa Starr, MD  Family Medicine Teaching Service

## 2022-12-21 ENCOUNTER — Ambulatory Visit: Admit: 2022-12-21 | Payer: PRIVATE HEALTH INSURANCE | Attending: Infectious Diseases | Primary: Internal Medicine

## 2022-12-22 ENCOUNTER — Telehealth: Admit: 2022-12-22 | Payer: PRIVATE HEALTH INSURANCE | Primary: Internal Medicine

## 2022-12-22 NOTE — Telephone Encounter
No show appointment with Dr. Vanessa Ralphs at [facility] on 9/10.  Attempted to call to discuss rescheduling and plan of care.  Number not in service. Per chart, patient recently hospitalized and was discharged to [facility] in [place] with RN supervisor Chase Picket) who was unaware of the appointment yesterday.  She will review with patient and facility MD whether to continue follow up at [facility] or a practice that is in closer geographical location. No further appointments scheduled at this time at [facility]

## 2023-01-06 ENCOUNTER — Encounter: Admit: 2023-01-06 | Payer: PRIVATE HEALTH INSURANCE | Primary: Internal Medicine

## 2023-01-06 ENCOUNTER — Telehealth: Admit: 2023-01-06 | Payer: PRIVATE HEALTH INSURANCE | Attending: Cardiovascular Disease | Primary: Internal Medicine

## 2023-01-06 NOTE — Telephone Encounter
 Tracy [name] was called regarding evidence of being disconnected  from [employer]. Unable to reach patient and will continue to follow as per [facility] Remote Monitoring Guidelines, this was 1st attempt Left a voicemail on patients phone.Provided clinic phone number, Technical Support phone number for implantable cardiac device vendor and [employer] Fact sheet

## 2023-01-07 ENCOUNTER — Telehealth: Admit: 2023-01-07 | Payer: PRIVATE HEALTH INSURANCE | Attending: Cardiovascular Disease | Primary: Internal Medicine

## 2023-01-07 NOTE — Telephone Encounter
 pt was dc'd from the hospital and was taken off most of her heart meds and she wants to know should she start them, pls call pt

## 2023-01-07 NOTE — Telephone Encounter
 Returned call to patient left message with call back number.

## 2023-01-07 NOTE — Telephone Encounter
 pt just got out of the hospital and sent over a transmission

## 2023-01-12 ENCOUNTER — Encounter: Payer: Self-pay | Admitting: Gastroenterology

## 2023-01-19 ENCOUNTER — Ambulatory Visit (INDEPENDENT_AMBULATORY_CARE_PROVIDER_SITE_OTHER): Payer: BC Managed Care – PPO | Admitting: Sports Medicine

## 2023-01-19 VITALS — BP 134/70 | Ht 68.0 in | Wt 160.0 lb

## 2023-01-19 DIAGNOSIS — M7711 Lateral epicondylitis, right elbow: Secondary | ICD-10-CM | POA: Diagnosis not present

## 2023-01-19 NOTE — Patient Instructions (Addendum)
You have lateral epicondylitis Try to avoid painful activities as much as possible. Wear the wrist brace provided today while working and sleeping.  Ice the area 3-4 times a day for 15 minutes at a time. Tylenol 1000mg  three times daily and Meloxicam 15 mg once daily as needed for pain Counterforce brace as directed can help unload area - wear this regularly if it provides you with relief. Hammer rotation exercise, wrist extension exercise with 1 pound weight - 3 sets of 10 once a day.   Stretching - hold for 20-30 seconds and repeat 3 times. We referred you to physical therapy started the nitroglycerin protocol. Can consider shock wave therapy or injections in the future if not improving. Follow up in 6 weeks.   Nitroglycerin Protocol Apply 1/4 nitroglycerin patch to affected area daily. Change position of patch within the affected area every 24 hours. You may experience a headache during the first 1-2 weeks of using the patch, these should subside. If you experience headaches after beginning nitroglycerin patch treatment, you may take your preferred over the counter pain reliever. Another side effect of the nitroglycerin patch is skin irritation or rash related to patch adhesive. Please notify our office if you develop more severe headaches or rash, and stop the patch. Tendon healing with nitroglycerin patch may require 12 to 24 weeks depending on the extent of injury. Men should not use if taking Viagra, Cialis, or Levitra.  Do not use if you have migraines or rosacea.    Call Outpatient Rehab to Reschedule your PT appt [address] [place], [place], [place] [address] Phone: [phone]

## 2023-01-19 NOTE — Progress Notes (Unsigned)
PCP: Westley Chandler, MD  SUBJECTIVE:   HPI:  Patient is a 62 y.o. female here for 6wk f/u of right lateral epicondylitis. Last visit was referred to PT, provided NG protocol as well as Rx for Meloxicam. Patient did not go to PT or start NG protocol, but has been taking the Mobic. She doesn't feel as though she has made any progress in the interim. 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 She is willing to do PT. She does not have a wrist brace.  ROS:     See HPI  PERTINENT  PMH / PSH FH / / SH:  Past Medical, Surgical, Social, and Family History Reviewed & Updated in the EMR.  Pertinent findings include:  Arthritis, HTN  No Known Allergies  OBJECTIVE:  BP 134/70   Ht 5\' 8"  (1.727 m)   Wt 160 lb (72.6 kg)   LMP 04/12/2004   BMI 24.33 kg/m   PHYSICAL EXAM:  GEN: Alert and Oriented, NAD, comfortable in exam room RESP: Unlabored respirations, symmetric chest rise PSY: normal mood, congruent affect   Right Elbow MSK exam: No deformity, erythema, ecchymosis, atrophy or swelling. She has full range of motion of the elbow in extension, flexion, supination and pronation.  Right wrist and digits also with full range of motion. Strength 5 out of 5 throughout. Reproducible tenderness to palpation over the lateral epicondyle and the proximal portion of the common extensor tendon. Reproduced pain at lateral epicondyle with resisted wrist and finger extension, unchanged    ASSESSMENT & PLAN:  1. Chronic Lateral epicondylitis of right elbow No interval improvement, however patient has yet to implement PT and NG protocol. Willing to do this, so was provided contact info for PT office to schedule. Has available refill on Meloxicam and advised it's ok to continue. Did provide the patient a wrist brace to wear at work and night to limit repetitive extension stress. Again reviewed potential next steps to be ECSWT, though patient  endorses the OOP cost of this to be exclusive, and CSI, though discussed the lack of long-term benefit from this therapy. F/u 6 weeks after starting PT and NG protocol.   Glean Salen, MD PGY-4, Sports Medicine Fellow [facility]  Addendum:  Patient seen in the office by fellow.  His history, exam, plan of care were precepted with me.  Norton Blizzard MD Marrianne Mood

## 2023-01-20 ENCOUNTER — Encounter: Admit: 2023-01-20 | Payer: PRIVATE HEALTH INSURANCE | Primary: Internal Medicine

## 2023-01-20 ENCOUNTER — Encounter: Admit: 2023-01-20 | Payer: PRIVATE HEALTH INSURANCE | Attending: Cardiovascular Disease | Primary: Internal Medicine

## 2023-01-20 DIAGNOSIS — Z95810 Presence of automatic (implantable) cardiac defibrillator: Secondary | ICD-10-CM

## 2023-01-27 NOTE — Progress Notes (Signed)
    SUBJECTIVE:   CHIEF COMPLAINT: BP check  HPI:   [name] is a 62 y.o.  with history notable for HTN  presenting for follow up.   The patient reports the eczema on her foot is improving.  No longer pruritic.  The patient is compliance with her antihypertensive medications.  She brings in a log today and shows remarkably well controlled blood pressures at home.  When compared to our in office his cuff her home cuff has been validated today. PERTINENT  PMH / PSH/Family/Social History : Updated and reviewed as appropriate  OBJECTIVE:   BP 136/62   Pulse 79   Ht 5\' 8"  (1.727 m)   Wt 160 lb 6.4 oz (72.8 kg)   LMP 04/12/2004   SpO2 100%   BMI 24.39 kg/m   Today's weight:  Last Weight  Most recent update: 01/31/2023  8:30 AM    Weight  72.8 kg (160 lb 6.4 oz)            Review of prior weights: Filed Weights   01/31/23 0830  Weight: 160 lb 6.4 oz (72.8 kg)    Regular rhythm lungs clear bilaterally to auscultation.  She has some hyperpigmented seborrheic keratoses on her face.  ASSESSMENT/PLAN:   Assessment & Plan Primary hypertension Taking meloxicam will repeat BMP in today. When compared to her home cuff is actually higher than ours.  We will keep her on the same medications for now. Elevated cholesterol Repeat direct LDL Need for shingles vaccine Rx printed  Tubular adenoma Has upcoming colonoscopy. Vitamin B12 deficiency Reports a remote history of this with another provider.  She eats a varied diet we will repeat today. Chronic obstructive pulmonary disease, unspecified COPD type (HCC) Refilled inhalers   HCM  She has an upcoming colonoscopy and flu shot was given today. Terisa Starr, MD  Family Medicine Teaching Service  [facility]

## 2023-01-31 ENCOUNTER — Encounter: Payer: Self-pay | Admitting: Family Medicine

## 2023-01-31 ENCOUNTER — Other Ambulatory Visit: Payer: Self-pay

## 2023-01-31 ENCOUNTER — Ambulatory Visit (INDEPENDENT_AMBULATORY_CARE_PROVIDER_SITE_OTHER): Payer: Medicare HMO | Admitting: Family Medicine

## 2023-01-31 VITALS — BP 136/62 | HR 79 | Ht 68.0 in | Wt 160.4 lb

## 2023-01-31 DIAGNOSIS — E7800 Pure hypercholesterolemia, unspecified: Secondary | ICD-10-CM | POA: Diagnosis not present

## 2023-01-31 DIAGNOSIS — I10 Essential (primary) hypertension: Secondary | ICD-10-CM | POA: Diagnosis not present

## 2023-01-31 DIAGNOSIS — Z23 Encounter for immunization: Secondary | ICD-10-CM

## 2023-01-31 DIAGNOSIS — J449 Chronic obstructive pulmonary disease, unspecified: Secondary | ICD-10-CM

## 2023-01-31 DIAGNOSIS — E538 Deficiency of other specified B group vitamins: Secondary | ICD-10-CM

## 2023-01-31 DIAGNOSIS — D369 Benign neoplasm, unspecified site: Secondary | ICD-10-CM

## 2023-01-31 MED ORDER — ALBUTEROL SULFATE HFA 108 (90 BASE) MCG/ACT IN AERS
2.0000 | INHALATION_SPRAY | Freq: Four times a day (QID) | RESPIRATORY_TRACT | 0 refills | Status: DC | PRN
Start: 2023-01-31 — End: 2023-06-06

## 2023-01-31 MED ORDER — SHINGRIX 50 MCG/0.5ML IM SUSR
0.5000 mL | INTRAMUSCULAR | 0 refills | Status: AC
Start: 2023-01-31 — End: 2023-04-02

## 2023-01-31 MED ORDER — ANORO ELLIPTA 62.5-25 MCG/ACT IN AEPB
1.0000 | INHALATION_SPRAY | Freq: Every day | RESPIRATORY_TRACT | 3 refills | Status: DC
Start: 2023-01-31 — End: 2023-06-06

## 2023-01-31 NOTE — Assessment & Plan Note (Signed)
Taking meloxicam will repeat BMP in today. When compared to her home cuff is actually higher than ours.  We will keep her on the same medications for now.

## 2023-01-31 NOTE — Patient Instructions (Addendum)
It was wonderful to see you today.  Please bring ALL of your medications with you to every visit.   Today we talked about:   - Checking blood work  - I will message or call with results  Remember to get the shingles vaccine at your pharmacy   - We will keep in mind that your home cuff reads a bit higher than our validated cuff    Please follow up in 3 months   Thank you for choosing [facility].   Please call [phone] with any questions about today's appointment.  Please be sure to schedule follow up at the front  desk before you leave today.   Terisa Starr, MD  Family Medicine

## 2023-01-31 NOTE — Assessment & Plan Note (Signed)
Refilled inhalers 

## 2023-01-31 NOTE — Assessment & Plan Note (Signed)
Has upcoming colonoscopy

## 2023-02-01 LAB — LDL CHOLESTEROL, DIRECT: LDL Direct: 79 mg/dL (ref 0–99)

## 2023-02-01 LAB — BASIC METABOLIC PANEL
BUN/Creatinine Ratio: 16 (ref 12–28)
BUN: 14 mg/dL (ref 8–27)
CO2: 25 mmol/L (ref 20–29)
Calcium: 9.6 mg/dL (ref 8.7–10.3)
Chloride: 101 mmol/L (ref 96–106)
Creatinine, Ser: 0.87 mg/dL (ref 0.57–1.00)
Glucose: 113 mg/dL — ABNORMAL HIGH (ref 70–99)
Potassium: 3.5 mmol/L (ref 3.5–5.2)
Sodium: 141 mmol/L (ref 134–144)
eGFR: 76 mL/min/{1.73_m2} (ref >59)

## 2023-02-01 LAB — VITAMIN B12: Vitamin B-12: 465 pg/mL (ref 232–1245)

## 2023-02-11 NOTE — Therapy (Signed)
OUTPATIENT PHYSICAL THERAPY ELBOW EVALUATION   Patient Name: [name] MRN: [id number] DOB:25-Aug-1960, 62 y.o., female Today's Date: 02/14/2023  END OF SESSION:  PT End of Session - 02/14/23 1433     Visit Number 1    Number of Visits 17    Date for PT Re-Evaluation 04/11/23    Authorization Type aetna medicare/BCBS    Authorization Time Period no auth per appt notes    PT Start Time 1435    PT Stop Time 1519    PT Time Calculation (min) 44 min    Activity Tolerance Patient tolerated treatment well    Behavior During Therapy [facility] for tasks assessed/performed             Past Medical History:  Diagnosis Date   Arthritis    Hypertension 2005   Past Surgical History:  Procedure Laterality Date   AXILLARY LYMPH NODE BIOPSY     BREAST BIOPSY     BREAST EXCISIONAL BIOPSY Left    CESAREAN SECTION     x 1   DIAGNOSTIC LAPAROSCOPY     for persistant right ovarian cyst   Patient Active Problem List   Diagnosis Date Noted   Lateral epicondylitis of right elbow 05/03/2022   Tubular adenoma 04/09/2018   Elevated ferritin 02/23/2018   Former heavy tobacco smoker 06/15/2017   Alcohol use    COPD (chronic obstructive pulmonary disease) (HCC) 12/21/2016   Hypertension 03/08/2012    PCP: Westley Chandler, MD  REFERRING PROVIDER: Marisa Cyphers, MD  REFERRING DIAG: [phone] (ICD-10-CM) - Right elbow pain  THERAPY DIAG:  Pain in right elbow  Muscle weakness (generalized)  Rationale for Evaluation and Treatment: Rehabilitation  ONSET DATE: about a year ago   SUBJECTIVE:                                                                                                                                                                                      SUBJECTIVE STATEMENT: 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 Gradual onset about a year ago denies any change in activities  or MOI. Slowly worsening. Has chronic numbness in R thumb from prior surgery, and chronic numbness in R middle finger from childhood injury, but no N/T otherwise. Doesn't notice any swelling. Pt states she has a brace but can't use it at work, does help around the house though.  Hand dominance: Right  PERTINENT HISTORY:  HTN, COPD  PAIN:  Are you having pain: 8-9/10 Location/description: R forearm, dorsally. aching Best-worst over past week: 0-9/10  - aggravating factors:  work activities (lifting, repetitive manual tasks), cooking tasks, sleeping (prolonged bending) - Easing factors: massage, brace  PRECAUTIONS: None   WEIGHT BEARING RESTRICTIONS: No  FALLS:  Has patient fallen in last 6 months? No  LIVING ENVIRONMENT: Apartment, 1 level Lives with son (2 y.o) Pt does majority of housework  OCCUPATION: Works full time as a Financial risk analyst - has to lift up to 25-30#, repetitive tasks  PLOF: Independent  PATIENT GOALS: reduce pain  NEXT MD VISIT: November 20th  OBJECTIVE:  Note: Objective measures were completed at Evaluation unless otherwise noted.  DIAGNOSTIC FINDINGS:  No imaging noted in chart  PATIENT SURVEYS:  FOTO 60 current, 64 predicted  COGNITION: Overall cognitive status: Within functional limits for tasks assessed     SENSATION: Mildly diminished R thumb and R middle finger (both chronic per pt), otherwise intact and symmetrical BIL UE   UPPER EXTREMITY ROM:   ROM Right eval Left eval  Elbow flexion 140 deg painless Full   Elbow extension Full * Full   Wrist flexion 65 deg * 70 deg  Wrist extension 55 deg   Wrist ulnar deviation    Wrist radial deviation    Wrist pronation >80 deg   Wrist supination >80 deg   (Blank rows = not tested) (Key: WFL = within functional limits not formally assessed, * = concordant pain, s = stiffness/stretching sensation, NT = not tested)   UPPER EXTREMITY MMT:  MMT Right eval Left eval  Elbow flexion 5 5  Elbow  extension 4- * 4  Wrist flexion 4+ * 4+  Wrist extension 4+ 4+  Wrist radial deviation    Wrist pronation    Wrist supination    Grip strength (lbs) 30# 45#  (Blank rows = not tested) (Key: WFL = within functional limits not formally assessed, * = concordant pain, s = stiffness/stretching sensation, NT = not tested)  Comment:   JOINT MOBILITY TESTING:  NT  PALPATION:  Concordant tenderness lateral triceps, lateral epicondyle, wrist extensors, brachioradialis    TODAY'S TREATMENT:                                                                                                                                         [facility] Adult PT Treatment:                                                DATE: 02/14/23 Therapeutic Exercise: Towel gripping with 5sec hold, 10 reps Wrist extension isometric 5sec hold, 5 reps HEP handout + education, rationale for interventions, relevant anatomy/physiology     PATIENT EDUCATION: Education details: Pt education on PT impairments, prognosis, and POC. Informed consent. Rationale for interventions, safe/appropriate HEP performance Person educated: Patient Education method: Explanation, Demonstration, Tactile cues, Verbal cues Education comprehension:  verbalized understanding, returned demonstration, verbal cues required, tactile cues required, and needs further education    HOME EXERCISE PROGRAM: Access Code: [email or web address] URL: [email or web address] Date: 02/14/2023 Prepared by: Fransisco Hertz  Exercises - Seated Gripping Towel  - 2-3 x daily - 1 sets - 8 reps - 3-5sec hold - Isometric Wrist Extension Pronated  - 2-3 x daily - 1 sets - 5 reps - 3-5sec hold  ASSESSMENT:  CLINICAL IMPRESSION: Patient is a pleasant 62 y.o. woman who was seen today for physical therapy evaluation and treatment for R elbow pain ongoing for past year without overt MOI but does endorse repetitive manual tasks for work. Endorses increased symptom irritability with  lifting and other manual tasks. On exam she demonstrates weakness at R elbow and wrist, reduced wrist mobility on RUE, and concordant tenderness to palpation of wrist extensors and triceps. Given repetitive nature of her job and lifting demands, anticipate pt would benefit from strengthening of aforementioned musculature and skilled PT to address relevant deficits. Tolerates HEP well with muscular fatigue but no reported increase in pain, no adverse events. Pt departs today's session in no acute distress, all voiced concerns/questions addressed appropriately from PT perspective.    OBJECTIVE IMPAIRMENTS: decreased activity tolerance, decreased endurance, decreased mobility, decreased ROM, decreased strength, impaired UE functional use, and pain.   ACTIVITY LIMITATIONS: carrying, lifting, and sleeping  PARTICIPATION LIMITATIONS: meal prep, cleaning, laundry, community activity, and occupation  PERSONAL FACTORS: Age, Time since onset of injury/illness/exacerbation, and 1-2 comorbidities: HTN, COPD  are also affecting patient's functional outcome.   REHAB POTENTIAL: Good  CLINICAL DECISION MAKING: Stable/uncomplicated  EVALUATION COMPLEXITY: Low   GOALS: Goals reviewed with patient? Yes  SHORT TERM GOALS: Target date: 03/14/2023 Pt will demonstrate appropriate understanding and performance of initially prescribed HEP in order to facilitate improved independence with management of symptoms.  Baseline: HEP provided on eval Goal status: INITIAL   2. Pt will report at least 25% decrease in overall pain levels in past week in order to facilitate improved tolerance to basic ADLs/mobility.   Baseline: 0-9/10  Goal status: INITIAL    LONG TERM GOALS: Target date: 04/11/2023 Pt will score 64 on FOTO in order to demonstrate improved perception of function due to symptoms. Baseline: 60 Goal status: INITIAL  2.  Pt will demonstrate grossly symmetrical wrist AROM without pain in order to facilitate  improved tolerance to daily tasks. Baseline: see ROM chart above Goal status: INITIAL  3.  Pt will demonstrate grip strength that is at least 5# from symmetrical in order to demonstrate improved functional strength. Baseline: see MMT chart above Goal status: INITIAL  4. Pt will report/demonstrate ability to lift up to 30# for 3 repetitions with less than 2 point increase in pain on NPS in order to indicate improved tolerance/independence with work tasks.  Baseline: NT on eval, pt endorses pain with lifting at work  Goal status: INITIAL   5. Pt will report at least 50% decrease in overall pain levels in past week in order to facilitate improved tolerance to basic ADLs/mobility.   Baseline: 0-9/10  Goal status: INITIAL    PLAN:  PT FREQUENCY: 1-2x/week  PT DURATION: 8 weeks  PLANNED INTERVENTIONS: 97164- PT Re-evaluation, 97110-Therapeutic exercises, 97530- Therapeutic activity, 97112- Neuromuscular re-education, 97535- Self Care, [id number]- Manual therapy, [phone]- Gait training, Patient/Family education, Balance training, Stair training, Taping, Dry Needling, Joint mobilization, Cryotherapy, and Moist heat  PLAN FOR NEXT SESSION: Review/update HEP PRN. Work on [employer] as appropriate with emphasis on  wrist extensor and triceps strength, wrist mobility, and grip strength. Symptom modification strategies as indicated/appropriate.    Ashley Murrain PT, DPT 02/14/2023 3:27 PM

## 2023-02-14 ENCOUNTER — Ambulatory Visit: Payer: Medicare HMO | Attending: Family Medicine | Admitting: Physical Therapy

## 2023-02-14 ENCOUNTER — Encounter: Payer: Self-pay | Admitting: Physical Therapy

## 2023-02-14 ENCOUNTER — Other Ambulatory Visit: Payer: Self-pay

## 2023-02-14 DIAGNOSIS — M25521 Pain in right elbow: Secondary | ICD-10-CM | POA: Diagnosis present

## 2023-02-14 DIAGNOSIS — M6281 Muscle weakness (generalized): Secondary | ICD-10-CM | POA: Diagnosis present

## 2023-02-22 ENCOUNTER — Other Ambulatory Visit: Payer: Self-pay | Admitting: *Deleted

## 2023-02-22 DIAGNOSIS — E785 Hyperlipidemia, unspecified: Secondary | ICD-10-CM

## 2023-02-22 MED ORDER — ATORVASTATIN CALCIUM 20 MG PO TABS: 20.0000 mg | ORAL_TABLET | Freq: Every day | ORAL | 3 refills | Status: DC

## 2023-03-01 ENCOUNTER — Ambulatory Visit: Payer: Medicare HMO | Admitting: Physical Therapy

## 2023-03-01 ENCOUNTER — Encounter: Payer: Self-pay | Admitting: Physical Therapy

## 2023-03-01 DIAGNOSIS — M25521 Pain in right elbow: Secondary | ICD-10-CM | POA: Diagnosis not present

## 2023-03-01 DIAGNOSIS — M6281 Muscle weakness (generalized): Secondary | ICD-10-CM

## 2023-03-01 NOTE — Therapy (Signed)
OUTPATIENT PHYSICAL THERAPY ELBOW EVALUATION   Patient Name: [name] MRN: [id number] DOB:08/04/1960, 62 y.o., female Today's Date: 03/01/2023  END OF SESSION:  PT End of Session - 03/01/23 1633     Visit Number 2    Number of Visits 17    Date for PT Re-Evaluation 04/11/23    Authorization Type aetna medicare/BCBS    Authorization Time Period no auth per appt notes    PT Start Time 1547    PT Stop Time 1632    PT Time Calculation (min) 45 min    Activity Tolerance Patient tolerated treatment well    Behavior During Therapy [facility] for tasks assessed/performed              Past Medical History:  Diagnosis Date   Arthritis    Hypertension 2005   Past Surgical History:  Procedure Laterality Date   AXILLARY LYMPH NODE BIOPSY     BREAST BIOPSY     BREAST EXCISIONAL BIOPSY Left    CESAREAN SECTION     x 1   DIAGNOSTIC LAPAROSCOPY     for persistant right ovarian cyst   Patient Active Problem List   Diagnosis Date Noted   Lateral epicondylitis of right elbow 05/03/2022   Tubular adenoma 04/09/2018   Elevated ferritin 02/23/2018   Former heavy tobacco smoker 06/15/2017   Alcohol use    COPD (chronic obstructive pulmonary disease) (HCC) 12/21/2016   Hypertension 03/08/2012    PCP: Westley Chandler, MD  REFERRING PROVIDER: Marisa Cyphers, MD  REFERRING DIAG: [phone] (ICD-10-CM) - Right elbow pain  THERAPY DIAG:  Pain in right elbow  Muscle weakness (generalized)  Rationale for Evaluation and Treatment: Rehabilitation  ONSET DATE: about a year ago   SUBJECTIVE:                                                                                                                                                                                      SUBJECTIVE STATEMENT: 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 Last week I was out Thursday and Friday.   With R elbow pain goes down pain.  I am a 9/10 today.  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 Gradual onset about a year ago denies any change in activities or MOI. Slowly worsening. Has chronic numbness in R thumb from prior surgery, and chronic numbness in R middle finger from childhood injury, but no N/T otherwise. Doesn't notice any swelling. Pt states she has a brace but can't use it at work, does help around  the house though.  Hand dominance: Right  PERTINENT HISTORY:  HTN, COPD  PAIN:  Are you having pain: 8-9/10 Location/description: R forearm, dorsally. aching Best-worst over past week: 0-9/10  - aggravating factors: work activities (lifting, repetitive manual tasks), cooking tasks, sleeping (prolonged bending) - Easing factors: massage, brace  PRECAUTIONS: None   WEIGHT BEARING RESTRICTIONS: No  FALLS:  Has patient fallen in last 6 months? No  LIVING ENVIRONMENT: Apartment, 1 level Lives with son (49 y.o) Pt does majority of housework  OCCUPATION: Works full time as a Financial risk analyst - has to lift up to 25-30#, repetitive tasks  PLOF: Independent  PATIENT GOALS: reduce pain  NEXT MD VISIT: November 20th  OBJECTIVE:  Note: Objective measures were completed at Evaluation unless otherwise noted.  DIAGNOSTIC FINDINGS:  No imaging noted in chart  PATIENT SURVEYS:  FOTO 60 current, 64 predicted  COGNITION: Overall cognitive status: Within functional limits for tasks assessed     SENSATION: Mildly diminished R thumb and R middle finger (both chronic per pt), otherwise intact and symmetrical BIL UE   UPPER EXTREMITY ROM:   ROM Right eval Left eval  Elbow flexion 140 deg painless Full   Elbow extension Full * Full   Wrist flexion 65 deg * 70 deg  Wrist extension 55 deg   Wrist ulnar deviation    Wrist radial deviation    Wrist pronation >80 deg   Wrist supination >80 deg   (Blank rows = not tested) (Key: WFL = within functional limits not formally assessed, * = concordant  pain, s = stiffness/stretching sensation, NT = not tested)   UPPER EXTREMITY MMT:  MMT Right eval Left eval  Elbow flexion 5 5  Elbow extension 4- * 4  Wrist flexion 4+ * 4+  Wrist extension 4+ 4+  Wrist radial deviation    Wrist pronation    Wrist supination    Grip strength (lbs) 30# 45#  (Blank rows = not tested) (Key: WFL = within functional limits not formally assessed, * = concordant pain, s = stiffness/stretching sensation, NT = not tested)  Comment:   JOINT MOBILITY TESTING:  NT  PALPATION:  Concordant tenderness lateral triceps, lateral epicondyle, wrist extensors, brachioradialis    TODAY'S TREATMENT:    [facility] Adult PT Treatment:                                                DATE: 03-01-23 Therapeutic Exercise: all on right UE UBE  UE  for 2.5 min forward and backward with VC forposture Towel gripping with 5sec hold, 10 reps Wrist extension isometric 5sec hold, 5 reps Seated Eccentric Wrist Extension 3 x 10 reps - 3 sec hold Seated Eccentric Wrist Flexion with Dumbbell   3 x 10 reps - 3 sec hold Standing Wrist Flexion Stretch   3 reps - 30 sec hold Standing Wrist Extension Stretch 3 reps - 30 sec hold Seated Elbow Manual Massage Clockwise  3 minutes Manual Therapy: Ice massage over R lateral epicondyle 8 min Seated Elbow Manual Massage Clockwise/ educated on friction massage  Self Care: Education on tennis elbow  Education about egronomics  in her kitchen at [employer]  [facility] Adult PT Treatment:                                                DATE: 02/14/23 Therapeutic Exercise: Towel gripping with 5sec hold, 10 reps Wrist extension isometric 5sec hold, 5 reps HEP handout + education, rationale for interventions, relevant anatomy/physiology     PATIENT EDUCATION: Education details: Pt education on PT  impairments, prognosis, and POC. Informed consent. Rationale for interventions, safe/appropriate HEP performance Person educated: Patient Education method: Explanation, Demonstration, Tactile cues, Verbal cues Education comprehension: verbalized understanding, returned demonstration, verbal cues required, tactile cues required, and needs further education    HOME EXERCISE PROGRAM: Access Code: [email or web address] URL: [email or web address] Date: 02/14/2023 Prepared by: Fransisco Hertz  Exercises - Seated Gripping Towel  - 2-3 x daily - 1 sets - 8 reps - 3-5sec hold - Isometric Wrist Extension Pronated  - 2-3 x daily - 1 sets - 5 reps - 3-5sec hold Added 03-01-23  - Seated Eccentric Wrist Extension  - 1 x daily - 7 x weekly - 3 sets - 10 reps - 3 sec hold - Seated Eccentric Wrist Flexion with Dumbbell  - 1 x daily - 7 x weekly - 3 sets - 10 reps - 3 sec hold - Standing Wrist Flexion Stretch  - 2-3 x daily - 7 x weekly - 1 sets - 3 reps - 30 sec hold - Standing Wrist Extension Stretch  - 2-3 x daily - 7 x weekly - 1 sets - 3 reps - 30 sec hold - Seated Elbow Manual Massage Clockwise  - 1 x daily - 7 x weekly - 3 sets - 10 reps  Patient Education - Tennis Elbow - Ergonomics for Wrist, Elbow, or Forearm Discomfort ASSESSMENT:  CLINICAL IMPRESSION: Pt returns with 9/10 pain in R elbow running down to wrist.  Session concentrated on education about condition and ergonomics for her job as a Financial risk analyst at a [employer].  Pt educated about ice massage to do at home for pain reduction.  HEP updated and return demo to ensure correct execution.  Pt left with all questions answered from PT perspective and pt stated she had reduced pain at end of session but no specific NPS.  EVAL Patient is a pleasant 63 y.o. woman who was seen today for physical therapy evaluation and treatment for R elbow pain ongoing for past year without overt MOI but does endorse repetitive manual tasks for work. Endorses  increased symptom irritability with lifting and other manual tasks. On exam she demonstrates weakness at R elbow and wrist, reduced wrist mobility on RUE, and concordant tenderness to palpation of wrist extensors and triceps. Given repetitive nature of her job and lifting demands, anticipate pt would benefit from strengthening of aforementioned musculature and skilled PT to address relevant deficits. Tolerates HEP well with muscular fatigue but no reported increase in pain, no adverse events. Pt departs today's session in no acute distress, all voiced concerns/questions addressed appropriately from PT perspective.    OBJECTIVE IMPAIRMENTS: decreased activity tolerance, decreased endurance, decreased mobility, decreased ROM, decreased strength, impaired UE functional use, and pain.   ACTIVITY LIMITATIONS: carrying, lifting, and sleeping  PARTICIPATION LIMITATIONS: meal prep, cleaning, laundry, community activity, and occupation  PERSONAL FACTORS: Age, Time since onset of injury/illness/exacerbation, and 1-2 comorbidities: HTN, COPD  are also affecting patient's functional outcome.  REHAB POTENTIAL: Good  CLINICAL DECISION MAKING: Stable/uncomplicated  EVALUATION COMPLEXITY: Low   GOALS: Goals reviewed with patient? Yes  SHORT TERM GOALS: Target date: 03/14/2023 Pt will demonstrate appropriate understanding and performance of initially prescribed HEP in order to facilitate improved independence with management of symptoms.  Baseline: HEP provided on eval Goal status: INITIAL   2. Pt will report at least 25% decrease in overall pain levels in past week in order to facilitate improved tolerance to basic ADLs/mobility.   Baseline: 0-9/10  Goal status: INITIAL    LONG TERM GOALS: Target date: 04/11/2023 Pt will score 64 on FOTO in order to demonstrate improved perception of function due to symptoms. Baseline: 60 Goal status: INITIAL  2.  Pt will demonstrate grossly symmetrical wrist AROM  without pain in order to facilitate improved tolerance to daily tasks. Baseline: see ROM chart above Goal status: INITIAL  3.  Pt will demonstrate grip strength that is at least 5# from symmetrical in order to demonstrate improved functional strength. Baseline: see MMT chart above Goal status: INITIAL  4. Pt will report/demonstrate ability to lift up to 30# for 3 repetitions with less than 2 point increase in pain on NPS in order to indicate improved tolerance/independence with work tasks.  Baseline: NT on eval, pt endorses pain with lifting at work  Goal status: INITIAL   5. Pt will report at least 50% decrease in overall pain levels in past week in order to facilitate improved tolerance to basic ADLs/mobility.   Baseline: 0-9/10  Goal status: INITIAL    PLAN:  PT FREQUENCY: 1-2x/week  PT DURATION: 8 weeks  PLANNED INTERVENTIONS: 97164- PT Re-evaluation, 97110-Therapeutic exercises, 97530- Therapeutic activity, 97112- Neuromuscular re-education, 97535- Self Care, 82956- Manual therapy, [id number]- Gait training, Patient/Family education, Balance training, Stair training, Taping, Dry Needling, Joint mobilization, Cryotherapy, and Moist heat  PLAN FOR NEXT SESSION: Review/update HEP PRN. Work on [employer] exercises as appropriate with emphasis on wrist extensor and triceps strength, wrist mobility, and grip strength. Symptom modification strategies as indicated/appropriate.   Garen Lah, PT, ATRIC Certified Exercise Expert for the Aging Adult  03/01/23 4:41 PM Phone: [phone] Fax: [phone]

## 2023-03-02 ENCOUNTER — Ambulatory Visit: Payer: BC Managed Care – PPO | Admitting: Sports Medicine

## 2023-03-03 ENCOUNTER — Encounter: Payer: Self-pay | Admitting: Physical Therapy

## 2023-03-03 ENCOUNTER — Ambulatory Visit: Payer: Medicare HMO | Admitting: Physical Therapy

## 2023-03-03 DIAGNOSIS — M25521 Pain in right elbow: Secondary | ICD-10-CM | POA: Diagnosis not present

## 2023-03-03 DIAGNOSIS — M6281 Muscle weakness (generalized): Secondary | ICD-10-CM

## 2023-03-03 NOTE — Therapy (Signed)
OUTPATIENT PHYSICAL THERAPY TREATMENT NOTE   Patient Name: [name] MRN: [id number] DOB:Nov 22, 1960, 62 y.o., female Today's Date: 03/03/2023  END OF SESSION:  PT End of Session - 03/03/23 1445     Visit Number 3    Number of Visits 17    Date for PT Re-Evaluation 04/11/23    Authorization Type aetna medicare/BCBS    Authorization Time Period no auth per appt notes    PT Start Time 1446    PT Stop Time 1524    PT Time Calculation (min) 38 min    Activity Tolerance Patient tolerated treatment well    Behavior During Therapy WFL for tasks assessed/performed               Past Medical History:  Diagnosis Date   Arthritis    Hypertension 2005   Past Surgical History:  Procedure Laterality Date   AXILLARY LYMPH NODE BIOPSY     BREAST BIOPSY     BREAST EXCISIONAL BIOPSY Left    CESAREAN SECTION     x 1   DIAGNOSTIC LAPAROSCOPY     for persistant right ovarian cyst   Patient Active Problem List   Diagnosis Date Noted   Lateral epicondylitis of right elbow 05/03/2022   Tubular adenoma 04/09/2018   Elevated ferritin 02/23/2018   Former heavy tobacco smoker 06/15/2017   Alcohol use    COPD (chronic obstructive pulmonary disease) (HCC) 12/21/2016   Hypertension 03/08/2012    PCP: Westley Chandler, MD  REFERRING PROVIDER: Marisa Cyphers, MD  REFERRING DIAG: [phone] (ICD-10-CM) - Right elbow pain  THERAPY DIAG:  Pain in right elbow  Muscle weakness (generalized)  Rationale for Evaluation and Treatment: Rehabilitation  ONSET DATE: about a year ago   SUBJECTIVE:                                                                                                                                                                                      SUBJECTIVE STATEMENT: 03/03/2023 Pt reports 9.5-10/10 pain after just getting off work. Felt good after last session with some relief that persisted until she returned to work. Has used ice with some relief.    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 Gradual onset about a year ago denies any change in activities or MOI. Slowly worsening. Has chronic numbness in R thumb from prior surgery, and chronic numbness in R middle finger from childhood injury, but no N/T otherwise. Doesn't notice any swelling. Pt states she has a brace but can't use it at work, does help  around the house though.  Hand dominance: Right  PERTINENT HISTORY:  HTN, COPD  PAIN:  Are you having pain: 9.5-10/10 Location/description: R forearm, dorsally. aching Best-worst over past week: 0-9/10  - aggravating factors: work activities (lifting, repetitive manual tasks), cooking tasks, sleeping (prolonged bending) - Easing factors: massage, brace  PRECAUTIONS: None   WEIGHT BEARING RESTRICTIONS: No  FALLS:  Has patient fallen in last 6 months? 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  DIAGNOSTIC FINDINGS:  No imaging noted in chart  PATIENT SURVEYS:  FOTO 60 current, 64 predicted  COGNITION: Overall cognitive status: Within functional limits for tasks assessed     SENSATION: Mildly diminished R thumb and R middle finger (both chronic per pt), otherwise intact and symmetrical BIL UE   UPPER EXTREMITY ROM:   ROM Right eval Left eval  Elbow flexion 140 deg painless Full   Elbow extension Full * Full   Wrist flexion 65 deg * 70 deg  Wrist extension 55 deg   Wrist ulnar deviation    Wrist radial deviation    Wrist pronation >80 deg   Wrist supination >80 deg   (Blank rows = not tested) (Key: WFL =  within functional limits not formally assessed, * = concordant pain, s = stiffness/stretching sensation, NT = not tested)   UPPER EXTREMITY MMT:  MMT Right eval Left eval  Elbow flexion 5 5  Elbow extension 4- * 4  Wrist flexion 4+ * 4+  Wrist extension 4+ 4+  Wrist radial deviation    Wrist pronation    Wrist supination    Grip strength (lbs) 30# 45#  (Blank rows = not tested) (Key: WFL = within functional limits not formally assessed, * = concordant pain, s = stiffness/stretching sensation, NT = not tested)  Comment:   JOINT MOBILITY TESTING:  NT  PALPATION:  Concordant tenderness lateral triceps, lateral epicondyle, wrist extensors, brachioradialis    TODAY'S TREATMENT:    [facility] Adult PT Treatment:                                                DATE: 03/03/23 Therapeutic Exercise: Wrist flexion stretch 3x30sec Wrist extension stretch 3x30sec Wrist extension isometric 3x15sec ~45 deg extension cues for setup Green band row w/ towel grip 2x10 cues for setup and reduced elbow compensations Green band shoulder ext w/ towel grip 2x10 cues for form and reduced compensations  Yellow band finger splays x12 cues for comfortable ROM HEP review/education  Manual Therapy: Seated w/ elbow supported, RUE STM wrist extensor bundle, brachioradialis Passive pin and stretch wrist extensor bundle into wrist flexion Trigger point release brachioradialis, wrist extensor bundle Radial head mobs grade 2-3 with pt reporting good relief with repetition    [facility] Adult PT Treatment:                                                DATE: 03-01-23  Therapeutic Exercise: all on right UE UBE  UE  for 2.5 min forward and backward with VC forposture Towel gripping with 5sec hold, 10 reps Wrist extension isometric 5sec hold, 5 reps Seated Eccentric Wrist Extension 3 x 10 reps - 3 sec hold Seated Eccentric Wrist Flexion with Dumbbell   3 x 10 reps - 3 sec hold Standing Wrist Flexion Stretch   3 reps -  30 sec hold Standing Wrist Extension Stretch 3 reps - 30 sec hold Seated Elbow Manual Massage Clockwise  3 minutes Manual Therapy: Ice massage over R lateral epicondyle 8 min Seated Elbow Manual Massage Clockwise/ educated on friction massage  Self Care: Education on tennis elbow  Education about egronomics  in her kitchen at [employer]                                                                                                                                         [facility] Adult PT Treatment:                                                DATE: 02/14/23 Therapeutic Exercise: Towel gripping with 5sec hold, 10 reps Wrist extension isometric 5sec hold, 5 reps HEP handout + education, rationale for interventions, relevant anatomy/physiology     PATIENT EDUCATION: Education details: rationale for interventions, HEP  Person educated: Patient Education method: Explanation, Demonstration, Tactile cues, Verbal cues Education comprehension: verbalized understanding, returned demonstration, verbal cues required, tactile cues required, and needs further education    HOME EXERCISE PROGRAM: Access Code: [email or web address] URL: [email or web address] Date: 02/14/2023 Prepared by: Fransisco Hertz  Exercises - Seated Gripping Towel  - 2-3 x daily - 1 sets - 8 reps - 3-5sec hold - Isometric Wrist Extension Pronated  - 2-3 x daily - 1 sets - 5 reps - 3-5sec hold Added 03-01-23  - Seated Eccentric Wrist Extension  - 1 x daily - 7 x weekly - 3 sets - 10 reps - 3 sec hold - Seated Eccentric Wrist Flexion with Dumbbell  - 1 x daily - 7 x weekly - 3 sets - 10 reps - 3 sec hold - Standing Wrist Flexion Stretch  - 2-3 x daily - 7 x weekly - 1 sets - 3 reps - 30 sec hold - Standing Wrist Extension Stretch  - 2-3 x daily - 7 x weekly - 1 sets - 3 reps - 30 sec hold - Seated Elbow Manual Massage Clockwise  - 1 x daily - 7 x weekly - 3 sets - 10 reps  Patient Education -  Tennis Elbow - Ergonomics for Wrist, Elbow, or Forearm Discomfort   ASSESSMENT:  CLINICAL IMPRESSION: 03/03/2023 Pt arrives w increased pain after work activities today, rated 9.5-10/10. Session initiated with manual  as above, pt with trigger points throughout dorsal forearm and responds well to intervention with reduction in pain to 7/10. Also reports relief with stretches and strength exercises. No adverse events, pt reports 5/10 on departure. Deferring HEP update today but did discuss possible benefit of tennis elbow brace as pt reports good relief with pressure to extensor bundle and states she is unable to wear her current brace at work. Recommend continuing along current POC in order to address relevant deficits and improve functional tolerance. Pt departs today's session in no acute distress, all voiced questions/concerns addressed appropriately from PT perspective.    EVAL Patient is a pleasant 62 y.o. woman who was seen today for physical therapy evaluation and treatment for R elbow pain ongoing for past year without overt MOI but does endorse repetitive manual tasks for work. Endorses increased symptom irritability with lifting and other manual tasks. On exam she demonstrates weakness at R elbow and wrist, reduced wrist mobility on RUE, and concordant tenderness to palpation of wrist extensors and triceps. Given repetitive nature of her job and lifting demands, anticipate pt would benefit from strengthening of aforementioned musculature and skilled PT to address relevant deficits. Tolerates HEP well with muscular fatigue but no reported increase in pain, no adverse events. Pt departs today's session in no acute distress, all voiced concerns/questions addressed appropriately from PT perspective.    OBJECTIVE IMPAIRMENTS: decreased activity tolerance, decreased endurance, decreased mobility, decreased ROM, decreased strength, impaired UE functional use, and pain.   ACTIVITY LIMITATIONS:  carrying, lifting, and sleeping  PARTICIPATION LIMITATIONS: meal prep, cleaning, laundry, community activity, and occupation  PERSONAL FACTORS: Age, Time since onset of injury/illness/exacerbation, and 1-2 comorbidities: HTN, COPD  are also affecting patient's functional outcome.   REHAB POTENTIAL: Good  CLINICAL DECISION MAKING: Stable/uncomplicated  EVALUATION COMPLEXITY: Low   GOALS: Goals reviewed with patient? Yes  SHORT TERM GOALS: Target date: 03/14/2023 Pt will demonstrate appropriate understanding and performance of initially prescribed HEP in order to facilitate improved independence with management of symptoms.  Baseline: HEP provided on eval Goal status: INITIAL   2. Pt will report at least 25% decrease in overall pain levels in past week in order to facilitate improved tolerance to basic ADLs/mobility.   Baseline: 0-9/10  Goal status: INITIAL    LONG TERM GOALS: Target date: 04/11/2023 Pt will score 64 on FOTO in order to demonstrate improved perception of function due to symptoms. Baseline: 60 Goal status: INITIAL  2.  Pt will demonstrate grossly symmetrical wrist AROM without pain in order to facilitate improved tolerance to daily tasks. Baseline: see ROM chart above Goal status: INITIAL  3.  Pt will demonstrate grip strength that is at least 5# from symmetrical in order to demonstrate improved functional strength. Baseline: see MMT chart above Goal status: INITIAL  4. Pt will report/demonstrate ability to lift up to 30# for 3 repetitions with less than 2 point increase in pain on NPS in order to indicate improved tolerance/independence with work tasks.  Baseline: NT on eval, pt endorses pain with lifting at work  Goal status: INITIAL   5. Pt will report at least 50% decrease in overall pain levels in past week in order to facilitate improved tolerance to basic ADLs/mobility.   Baseline: 0-9/10  Goal status: INITIAL    PLAN:  PT FREQUENCY: 1-2x/week  PT  DURATION: 8 weeks  PLANNED INTERVENTIONS: 97164- PT Re-evaluation, 97110-Therapeutic exercises, 97530- Therapeutic activity, [id number]- Neuromuscular re-education, 97535- Self Care, 65784- Manual therapy, [phone]- Gait training, Patient/Family  education, Balance training, Stair training, Taping, Dry Needling, Joint mobilization, Cryotherapy, and Moist heat  PLAN FOR NEXT SESSION: Review/update HEP PRN. Work on [employer] exercises as appropriate with emphasis on wrist extensor and triceps strength, wrist mobility, and grip strength. Symptom modification strategies as indicated/appropriate.    Ashley Murrain PT, DPT 03/03/2023 3:28 PM

## 2023-03-07 ENCOUNTER — Ambulatory Visit (AMBULATORY_SURGERY_CENTER): Payer: Medicare HMO | Admitting: *Deleted

## 2023-03-07 VITALS — Ht 68.0 in | Wt 160.0 lb

## 2023-03-07 DIAGNOSIS — Z860100 Personal history of colon polyps, unspecified: Secondary | ICD-10-CM

## 2023-03-07 NOTE — Progress Notes (Signed)
Pt's name and DOB verified at the beginning of the pre-visit wit 2 identifiers  Pt denies any difficulty with ambulating,sitting, laying down or rolling side to side  Pt has issues with ambulation   Pt has no issues moving head neck or swallowing  No egg or soy allergy known to patient   No issues known to pt with past sedation with any surgeries or procedures  Pt denies having issues being intubated  No FH of Malignant Hyperthermia  Pt is not on diet pills or shots  Pt is not on home 02   Pt is not on blood thinners    Pt denies issues with constipation   Pt is not on dialysis  Pt denise any abnormal heart rhythms   Pt denies any upcoming cardiac testing  Pt encouraged to use to use Singlecare or Goodrx to reduce cost   Patient's chart reviewed by Cathlyn Parsons CNRA prior to pre-visit and patient appropriate for the [facility].  Pre-visit completed and red dot placed by patient's name on their procedure day (on provider's schedule).  .  Visit by phone  Pt states weight is 160 LB  Instructed pt why it is important to and  to call if they have any changes in health or new medications. Directed them to the # given and on instructions.     Instructions reviewed. Pt given both [facility] main # and MD on call # prior to instructions.  Pt states understanding. Instructed to review again prior to procedure. Pt states they will.   Instructions sent by mail with coupon and by My Chart   Coupon sent via text to mobile phone and pt verified they received it

## 2023-03-07 NOTE — Therapy (Signed)
OUTPATIENT PHYSICAL THERAPY TREATMENT NOTE   Patient Name: [name] MRN: [id number] DOB:[birth year], 62 y.o., female Today's Date: 03/08/2023  END OF SESSION:  PT End of Session - 03/08/23 1426     Visit Number 4    Number of Visits 17    Date for PT Re-Evaluation 04/11/23    Authorization Type aetna medicare/BCBS    Authorization Time Period no auth per appt notes    PT Start Time 1427    PT Stop Time 1512    PT Time Calculation (min) 45 min    Activity Tolerance Patient tolerated treatment well;No increased pain    Behavior During Therapy WFL for tasks assessed/performed                Past Medical History:  Diagnosis Date   Arthritis    COPD (chronic obstructive pulmonary disease) (HCC)    Hyperlipidemia    Hypertension 2005   Past Surgical History:  Procedure Laterality Date   AXILLARY LYMPH NODE BIOPSY     BREAST BIOPSY     BREAST EXCISIONAL BIOPSY Left    CESAREAN SECTION     x 1   DIAGNOSTIC LAPAROSCOPY     for persistant right ovarian cyst   Patient Active Problem List   Diagnosis Date Noted   Lateral epicondylitis of right elbow 05/03/2022   Tubular adenoma 04/09/2018   Elevated ferritin 02/23/2018   Former heavy tobacco smoker 06/15/2017   Alcohol use    COPD (chronic obstructive pulmonary disease) (HCC) 12/21/2016   Hypertension 03/08/2012    PCP: Westley Chandler, MD  REFERRING PROVIDER: Marisa Cyphers, MD  REFERRING DIAG: [phone] (ICD-10-CM) - Right elbow pain  THERAPY DIAG:  Pain in right elbow  Muscle weakness (generalized)  Rationale for Evaluation and Treatment: Rehabilitation  ONSET DATE: about a year ago   SUBJECTIVE:                                                                                                                                                                                      SUBJECTIVE STATEMENT: 03/08/2023 Pt reports 7/10 pain today, "not too bad, feeling pretty good". Felt good relief after  last session and feels work has been less painful, doing exercises before work.    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 Gradual onset about a year ago denies any change in activities or MOI. Slowly worsening. Has chronic numbness in R thumb from prior surgery, and chronic numbness in R middle finger from childhood injury, but no N/T otherwise. Doesn't notice any  swelling. Pt states she has a brace but can't use it at work, does help around the house though.  Hand dominance: Right  PERTINENT HISTORY:  HTN, COPD  PAIN:  Are you having pain: 7/10 Location/description: R forearm, dorsally. aching Best-worst over past week: 0-9/10  - aggravating factors: work activities (lifting, repetitive manual tasks), cooking tasks, sleeping (prolonged bending) - Easing factors: massage, brace  PRECAUTIONS: None   WEIGHT BEARING RESTRICTIONS: No  FALLS:  Has patient fallen in last 6 months? No  LIVING ENVIRONMENT: Apartment, 1 level Lives with son (40 y.o) Pt does majority of housework  OCCUPATION: Works full time as a Financial risk analyst - has to lift up to 25-30#, repetitive tasks  PLOF: Independent  PATIENT GOALS: reduce pain  NEXT MD VISIT: December 4th   OBJECTIVE:  Note: Objective measures were completed at Evaluation unless otherwise noted.  DIAGNOSTIC FINDINGS:  No imaging noted in chart  PATIENT SURVEYS:  FOTO 60 current, 64 predicted  COGNITION: Overall cognitive status: Within functional limits for tasks assessed     SENSATION: Mildly diminished R thumb and R middle finger (both chronic per pt), otherwise intact and symmetrical BIL UE   UPPER EXTREMITY ROM:   ROM Right eval Left eval  Elbow flexion 140 deg painless Full   Elbow extension Full * Full   Wrist flexion 65 deg * 70 deg  Wrist extension 55 deg   Wrist ulnar deviation    Wrist radial deviation    Wrist pronation >80  deg   Wrist supination >80 deg   (Blank rows = not tested) (Key: WFL = within functional limits not formally assessed, * = concordant pain, s = stiffness/stretching sensation, NT = not tested)   UPPER EXTREMITY MMT:  MMT Right eval Left eval  Elbow flexion 5 5  Elbow extension 4- * 4  Wrist flexion 4+ * 4+  Wrist extension 4+ 4+  Wrist radial deviation    Wrist pronation    Wrist supination    Grip strength (lbs) 30# 45#  (Blank rows = not tested) (Key: WFL = within functional limits not formally assessed, * = concordant pain, s = stiffness/stretching sensation, NT = not tested)  Comment:   JOINT MOBILITY TESTING:  NT  PALPATION:  Concordant tenderness lateral triceps, lateral epicondyle, wrist extensors, brachioradialis    TODAY'S TREATMENT:    [facility] Adult PT Treatment:                                                DATE: 03/08/23 Therapeutic Exercise: Towel grip blue band row; 3x10 cues for posture and reduced elbow compensations Towel grip blue band shoulder ext 2x10 cues for posture Wrist ext isometrics 2x5 in wrist extended position Wrist ext isometric 2x5 in neutral position Elbow flexion isometric in neutral position x8  Yellow T bar U 2x5 Yellow T bar arch 2x5 HEP update + education/handout  Manual Therapy: Seated w/ elbow supported, RUE STM wrist extensor bundle, brachioradialis, tricep Passive pin and stretch wrist extensor bundle into wrist flexion in pronated and neutral positions Trigger point release brachioradialis, wrist extensor bundle Radial head mobs grade 2-3 with pt reporting good relief with repetition  Therapeutic Activity: Towel grip BIL KB 10# x61ft, 1 lap pronated, 1 lap supinated Suitcase carry towel grip 5# 151ft cues for posture/grip    [facility] Adult PT Treatment:  DATE: 03/03/23 Therapeutic Exercise: Wrist flexion stretch 3x30sec Wrist extension stretch 3x30sec Wrist extension isometric  3x15sec ~45 deg extension cues for setup Green band row w/ towel grip 2x10 cues for setup and reduced elbow compensations Green band shoulder ext w/ towel grip 2x10 cues for form and reduced compensations  Yellow band finger splays x12 cues for comfortable ROM HEP review/education  Manual Therapy: Seated w/ elbow supported, RUE STM wrist extensor bundle, brachioradialis Passive pin and stretch wrist extensor bundle into wrist flexion Trigger point release brachioradialis, wrist extensor bundle Radial head mobs grade 2-3 with pt reporting good relief with repetition    [facility] Adult PT Treatment:                                                DATE: 03-01-23 Therapeutic Exercise: all on right UE UBE  UE  for 2.5 min forward and backward with VC forposture Towel gripping with 5sec hold, 10 reps Wrist extension isometric 5sec hold, 5 reps Seated Eccentric Wrist Extension 3 x 10 reps - 3 sec hold Seated Eccentric Wrist Flexion with Dumbbell   3 x 10 reps - 3 sec hold Standing Wrist Flexion Stretch   3 reps - 30 sec hold Standing Wrist Extension Stretch 3 reps - 30 sec hold Seated Elbow Manual Massage Clockwise  3 minutes Manual Therapy: Ice massage over R lateral epicondyle 8 min Seated Elbow Manual Massage Clockwise/ educated on friction massage  Self Care: Education on tennis elbow  Education about egronomics  in her kitchen at [employer]                          PATIENT EDUCATION: Education details: rationale for interventions, HEP  Person educated: Patient Education method: Explanation, Demonstration, Actor cues, Verbal cues Education comprehension: verbalized understanding, returned demonstration, verbal cues required, tactile cues required, and needs further education    HOME EXERCISE PROGRAM: Access Code: [email or web address] URL: [email or web address] Date: 03/08/2023 Prepared by: Fransisco Hertz  Exercises - Standing Wrist Flexion Stretch  -  2-3 x daily - 7 x weekly - 1 sets - 3 reps - 30 sec hold - Standing Wrist Extension Stretch  - 2-3 x daily - 7 x weekly - 1 sets - 3 reps - 30 sec hold - Seated elbow manual massage clockwise of friction cross wise massage  - 1 x daily - 7 x weekly - 3 sets - 10 reps - Seated Isometric Elbow Flexion  - 2-3 x daily - 1 sets - 8 reps - 3-5sec hold - Isometric Wrist Extension Pronated  - 2-3 x daily - 1 sets - 8 reps - 3-5sec hold - Standing Shoulder Row with Anchored Resistance  - 2-3 x daily - 1 sets - 10 reps   ASSESSMENT:  CLINICAL IMPRESSION: 03/08/2023 Pt arrives w/ 7/10 pain on NPS, described as "not too bad, feeling pretty good", also endorses improved work tolerance. Today continues to endorse excellent response to manual as above. Following with continued progressions with grip/UE strengthening. Does report some fatigue in hand but overall tolerates well with steady reduction in pain, departing with 5/10 on NPS. No adverse events. Recommend continuing along current POC in order to address relevant deficits and improve functional tolerance. Pt departs today's session in no acute distress, all voiced questions/concerns addressed appropriately from PT perspective.  EVAL Patient is a pleasant 62 y.o. woman who was seen today for physical therapy evaluation and treatment for R elbow pain ongoing for past year without overt MOI but does endorse repetitive manual tasks for work. Endorses increased symptom irritability with lifting and other manual tasks. On exam she demonstrates weakness at R elbow and wrist, reduced wrist mobility on RUE, and concordant tenderness to palpation of wrist extensors and triceps. Given repetitive nature of her job and lifting demands, anticipate pt would benefit from strengthening of aforementioned musculature and skilled PT to address relevant deficits. Tolerates HEP well with muscular fatigue but no reported increase in pain, no adverse events. Pt departs today's  session in no acute distress, all voiced concerns/questions addressed appropriately from PT perspective.    OBJECTIVE IMPAIRMENTS: decreased activity tolerance, decreased endurance, decreased mobility, decreased ROM, decreased strength, impaired UE functional use, and pain.   ACTIVITY LIMITATIONS: carrying, lifting, and sleeping  PARTICIPATION LIMITATIONS: meal prep, cleaning, laundry, community activity, and occupation  PERSONAL FACTORS: Age, Time since onset of injury/illness/exacerbation, and 1-2 comorbidities: HTN, COPD  are also affecting patient's functional outcome.   REHAB POTENTIAL: Good  CLINICAL DECISION MAKING: Stable/uncomplicated  EVALUATION COMPLEXITY: Low   GOALS: Goals reviewed with patient? Yes  SHORT TERM GOALS: Target date: 03/14/2023 Pt will demonstrate appropriate understanding and performance of initially prescribed HEP in order to facilitate improved independence with management of symptoms.  Baseline: HEP provided on eval Goal status: INITIAL   2. Pt will report at least 25% decrease in overall pain levels in past week in order to facilitate improved tolerance to basic ADLs/mobility.   Baseline: 0-9/10  Goal status: INITIAL    LONG TERM GOALS: Target date: 04/11/2023 Pt will score 64 on FOTO in order to demonstrate improved perception of function due to symptoms. Baseline: 60 Goal status: INITIAL  2.  Pt will demonstrate grossly symmetrical wrist AROM without pain in order to facilitate improved tolerance to daily tasks. Baseline: see ROM chart above Goal status: INITIAL  3.  Pt will demonstrate grip strength that is at least 5# from symmetrical in order to demonstrate improved functional strength. Baseline: see MMT chart above Goal status: INITIAL  4. Pt will report/demonstrate ability to lift up to 30# for 3 repetitions with less than 2 point increase in pain on NPS in order to indicate improved tolerance/independence with work tasks.  Baseline: NT  on eval, pt endorses pain with lifting at work  Goal status: INITIAL   5. Pt will report at least 50% decrease in overall pain levels in past week in order to facilitate improved tolerance to basic ADLs/mobility.   Baseline: 0-9/10  Goal status: INITIAL    PLAN:  PT FREQUENCY: 1-2x/week  PT DURATION: 8 weeks  PLANNED INTERVENTIONS: 97164- PT Re-evaluation, 97110-Therapeutic exercises, 97530- Therapeutic activity, 97112- Neuromuscular re-education, 97535- Self Care, [id number]- Manual therapy, [phone]- Gait training, Patient/Family education, Balance training, Stair training, Taping, Dry Needling, Joint mobilization, Cryotherapy, and Moist heat  PLAN FOR NEXT SESSION: Review/update HEP PRN. Work on [employer] exercises as appropriate with emphasis on wrist extensor and triceps strength, wrist mobility, and grip strength. Symptom modification strategies as indicated/appropriate.    Ashley Murrain PT, DPT 03/08/2023 3:17 PM

## 2023-03-08 ENCOUNTER — Ambulatory Visit: Payer: Medicare HMO | Admitting: Physical Therapy

## 2023-03-08 ENCOUNTER — Encounter: Payer: Self-pay | Admitting: Physical Therapy

## 2023-03-08 DIAGNOSIS — M25521 Pain in right elbow: Secondary | ICD-10-CM

## 2023-03-08 DIAGNOSIS — M6281 Muscle weakness (generalized): Secondary | ICD-10-CM

## 2023-03-14 NOTE — Therapy (Signed)
OUTPATIENT PHYSICAL THERAPY TREATMENT NOTE   Patient Name: [name] MRN: [id number] DOB:1961-02-24, 62 y.o., female Today's Date: 03/15/2023  END OF SESSION:  PT End of Session - 03/15/23 1424     Visit Number 5    Number of Visits 17    Date for PT Re-Evaluation 04/11/23    Authorization Type [employer] medicare/[employer]    Authorization Time Period no auth per appt notes    PT Start Time 1425    PT Stop Time 1505    PT Time Calculation (min) 40 min    Activity Tolerance Patient tolerated treatment well;No increased pain    Behavior During Therapy WFL for tasks assessed/performed                 Past Medical History:  Diagnosis Date   Arthritis    COPD (chronic obstructive pulmonary disease) (HCC)    Hyperlipidemia    Hypertension 2005   Past Surgical History:  Procedure Laterality Date   AXILLARY LYMPH NODE BIOPSY     BREAST BIOPSY     BREAST EXCISIONAL BIOPSY Left    CESAREAN SECTION     x 1   DIAGNOSTIC LAPAROSCOPY     for persistant right ovarian cyst   Patient Active Problem List   Diagnosis Date Noted   Lateral epicondylitis of right elbow 05/03/2022   Tubular adenoma 04/09/2018   Elevated ferritin 02/23/2018   Former heavy tobacco smoker 06/15/2017   Alcohol use    COPD (chronic obstructive pulmonary disease) (HCC) 12/21/2016   Hypertension 03/08/2012    PCP: Westley Chandler, MD  REFERRING PROVIDER: Marisa Cyphers, MD  REFERRING DIAG: [phone] (ICD-10-CM) - Right elbow pain  THERAPY DIAG:  Pain in right elbow  Muscle weakness (generalized)  Rationale for Evaluation and Treatment: Rehabilitation  ONSET DATE: about a year ago   SUBJECTIVE:                                                                                                                                                                                      SUBJECTIVE STATEMENT: 03/15/2023 Pt denies any overt pain today, school closed. About 1/10 discomfort in elbow. Felt  good relief after last session until she returned to work the next day. No longer having as much of the distal symptoms. HEP going well, doing daily.    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 Gradual onset about a year ago denies any change in activities or MOI. Slowly worsening. Has chronic numbness in R thumb from prior surgery, and chronic numbness  in R middle finger from childhood injury, but no N/T otherwise. Doesn't notice any swelling. Pt states she has a brace but can't use it at work, does help around the house though.  Hand dominance: Right  PERTINENT HISTORY:  HTN, COPD  PAIN:  Are you having pain: 1/10 Best/worst over past week: 0-8/10  Per eval - Location/description: R forearm, dorsally. aching Best-worst over past week: 0-9/10  - aggravating factors: work activities (lifting, repetitive manual tasks), cooking tasks, sleeping (prolonged bending) - Easing factors: massage, brace  PRECAUTIONS: None   WEIGHT BEARING RESTRICTIONS: No  FALLS:  Has patient fallen in last 6 months? No  LIVING ENVIRONMENT: Apartment, 1 level Lives with son (36 y.o) Pt does majority of housework  OCCUPATION: Works full time as a Financial risk analyst - has to lift up to 25-30#, repetitive tasks  PLOF: Independent  PATIENT GOALS: reduce pain  NEXT MD VISIT: December 4th   OBJECTIVE:  Note: Objective measures were completed at Evaluation unless otherwise noted.  DIAGNOSTIC FINDINGS:  No imaging noted in chart  PATIENT SURVEYS:  FOTO 60 current, 64 predicted  COGNITION: Overall cognitive status: Within functional limits for tasks assessed     SENSATION: Mildly diminished R thumb and R middle finger (both chronic per pt), otherwise intact and symmetrical BIL UE   UPPER EXTREMITY ROM:   ROM Right eval Left eval  Elbow flexion 140 deg painless Full   Elbow extension Full * Full   Wrist  flexion 65 deg * 70 deg  Wrist extension 55 deg   Wrist ulnar deviation    Wrist radial deviation    Wrist pronation >80 deg   Wrist supination >80 deg   (Blank rows = not tested) (Key: WFL = within functional limits not formally assessed, * = concordant pain, s = stiffness/stretching sensation, NT = not tested)   UPPER EXTREMITY MMT:  MMT Right eval Left eval Right 03/15/23  Elbow flexion 5 5   Elbow extension 4- * 4 4+ *  Wrist flexion 4+ * 4+   Wrist extension 4+ 4+   Wrist radial deviation     Wrist pronation     Wrist supination     Grip strength (lbs) 30# 45# 45#  (Blank rows = not tested) (Key: WFL = within functional limits not formally assessed, * = concordant pain, s = stiffness/stretching sensation, NT = not tested)  Comment:   JOINT MOBILITY TESTING:  NT  PALPATION:  Concordant tenderness lateral triceps, lateral epicondyle, wrist extensors, brachioradialis    TODAY'S TREATMENT:    [facility] Adult PT Treatment:                                                DATE: 03/15/23 Therapeutic Exercise: Blue band row, towel grip 2x12 Blue band shoulder ext towel grip 2x12 Bicep focused face pulls at CC w/ towel grip; 7# 2x12 cues for positioning and form Tricep push down CC 7# 2x8 cues for reduced elbow compensations (has elbow cavitation on first set, nonpainful and no issues afterwards) Supported wrist flexion 3# 2x8 Yellow T bar U's x12 cues for elbow positioning Yellow T bar arches x12 cues for elbow positioning Yellow band wrist ext in neutral, elbow supported 2x8 Yellow band finger splays 2x12 Wrist flexion stretch 3x30sec hold Wrist extension stretch 3x30sec hold    [facility] Adult PT Treatment:  DATE: 03/08/23 Therapeutic Exercise: Towel grip blue band row; 3x10 cues for posture and reduced elbow compensations Towel grip blue band shoulder ext 2x10 cues for posture Wrist ext isometrics 2x5 in wrist extended  position Wrist ext isometric 2x5 in neutral position Elbow flexion isometric in neutral position x8  Yellow T bar U 2x5 Yellow T bar arch 2x5 HEP update + education/handout  Manual Therapy: Seated w/ elbow supported, RUE STM wrist extensor bundle, brachioradialis, tricep Passive pin and stretch wrist extensor bundle into wrist flexion in pronated and neutral positions Trigger point release brachioradialis, wrist extensor bundle Radial head mobs grade 2-3 with pt reporting good relief with repetition  Therapeutic Activity: Towel grip BIL KB 10# x42ft, 1 lap pronated, 1 lap supinated Suitcase carry towel grip 5# 130ft cues for posture/grip    [facility] Adult PT Treatment:                                                DATE: 03/03/23 Therapeutic Exercise: Wrist flexion stretch 3x30sec Wrist extension stretch 3x30sec Wrist extension isometric 3x15sec ~45 deg extension cues for setup Green band row w/ towel grip 2x10 cues for setup and reduced elbow compensations Green band shoulder ext w/ towel grip 2x10 cues for form and reduced compensations  Yellow band finger splays x12 cues for comfortable ROM HEP review/education  Manual Therapy: Seated w/ elbow supported, RUE STM wrist extensor bundle, brachioradialis Passive pin and stretch wrist extensor bundle into wrist flexion Trigger point release brachioradialis, wrist extensor bundle Radial head mobs grade 2-3 with pt reporting good relief with repetition                   PATIENT EDUCATION: Education details: rationale for interventions, HEP  Person educated: Patient Education method: Explanation, Demonstration, Tactile cues, Verbal cues Education comprehension: verbalized understanding, returned demonstration, verbal cues required, tactile cues required, and needs further education    HOME EXERCISE PROGRAM: Access Code: [email or web address] URL: [email or web address] Date: 03/08/2023 Prepared by: Fransisco Hertz  Exercises -  Standing Wrist Flexion Stretch  - 2-3 x daily - 7 x weekly - 1 sets - 3 reps - 30 sec hold - Standing Wrist Extension Stretch  - 2-3 x daily - 7 x weekly - 1 sets - 3 reps - 30 sec hold - Seated elbow manual massage clockwise of friction cross wise massage  - 1 x daily - 7 x weekly - 3 sets - 10 reps - Seated Isometric Elbow Flexion  - 2-3 x daily - 1 sets - 8 reps - 3-5sec hold - Isometric Wrist Extension Pronated  - 2-3 x daily - 1 sets - 8 reps - 3-5sec hold - Standing Shoulder Row with Anchored Resistance  - 2-3 x daily - 1 sets - 10 reps   ASSESSMENT:  CLINICAL IMPRESSION: 03/15/2023 Pt arrives w/ minimal pain (~1/10 discomfort), felt good after last session. Given improvement in pain trial of deferring manual today, instead focusing more on hand/wrist/elbow strengthening which pt tolerates quite well. No adverse events, reports some muscular fatigue but no increase in pain on departure. Noted improvement in MMT and grip strength compared to initial eval. Recommend continuing along current POC in order to address relevant deficits and improve functional tolerance. Pt departs today's session in no acute distress, all voiced questions/concerns addressed appropriately from PT perspective.    EVAL Patient is  a pleasant 62 y.o. woman who was seen today for physical therapy evaluation and treatment for R elbow pain ongoing for past year without overt MOI but does endorse repetitive manual tasks for work. Endorses increased symptom irritability with lifting and other manual tasks. On exam she demonstrates weakness at R elbow and wrist, reduced wrist mobility on RUE, and concordant tenderness to palpation of wrist extensors and triceps. Given repetitive nature of her job and lifting demands, anticipate pt would benefit from strengthening of aforementioned musculature and skilled PT to address relevant deficits. Tolerates HEP well with muscular fatigue but no reported increase in pain, no adverse events. Pt  departs today's session in no acute distress, all voiced concerns/questions addressed appropriately from PT perspective.    OBJECTIVE IMPAIRMENTS: decreased activity tolerance, decreased endurance, decreased mobility, decreased ROM, decreased strength, impaired UE functional use, and pain.   ACTIVITY LIMITATIONS: carrying, lifting, and sleeping  PARTICIPATION LIMITATIONS: meal prep, cleaning, laundry, community activity, and occupation  PERSONAL FACTORS: Age, Time since onset of injury/illness/exacerbation, and 1-2 comorbidities: HTN, COPD  are also affecting patient's functional outcome.   REHAB POTENTIAL: Good  CLINICAL DECISION MAKING: Stable/uncomplicated  EVALUATION COMPLEXITY: Low   GOALS: Goals reviewed with patient? Yes  SHORT TERM GOALS: Target date: 03/14/2023 Pt will demonstrate appropriate understanding and performance of initially prescribed HEP in order to facilitate improved independence with management of symptoms.  Baseline: HEP provided on eval 03/15/23: reports daily HEP adherence Goal status: MET  2. Pt will report at least 25% decrease in overall pain levels in past week in order to facilitate improved tolerance to basic ADLs/mobility.   Baseline: 0-9/10 03/15/23: 0-8/10 Goal status: ONGOING    LONG TERM GOALS: Target date: 04/11/2023 Pt will score 64 on FOTO in order to demonstrate improved perception of function due to symptoms. Baseline: 60 Goal status: INITIAL  2.  Pt will demonstrate grossly symmetrical wrist AROM without pain in order to facilitate improved tolerance to daily tasks. Baseline: see ROM chart above Goal status: INITIAL  3.  Pt will demonstrate grip strength that is at least 5# from symmetrical in order to demonstrate improved functional strength. Baseline: see MMT chart above Goal status: INITIAL  4. Pt will report/demonstrate ability to lift up to 30# for 3 repetitions with less than 2 point increase in pain on NPS in order to  indicate improved tolerance/independence with work tasks.  Baseline: NT on eval, pt endorses pain with lifting at work  Goal status: INITIAL   5. Pt will report at least 50% decrease in overall pain levels in past week in order to facilitate improved tolerance to basic ADLs/mobility.   Baseline: 0-9/10  Goal status: INITIAL    PLAN:  PT FREQUENCY: 1-2x/week  PT DURATION: 8 weeks  PLANNED INTERVENTIONS: 97164- PT Re-evaluation, 97110-Therapeutic exercises, 97530- Therapeutic activity, 97112- Neuromuscular re-education, 97535- Self Care, 82956- Manual therapy, [phone]- Gait training, Patient/Family education, Balance training, Stair training, Taping, Dry Needling, Joint mobilization, Cryotherapy, and Moist heat  PLAN FOR NEXT SESSION: Review/update HEP PRN. Work on [employer] exercises as appropriate with emphasis on wrist extensor and triceps strength, wrist mobility, and grip strength. Symptom modification strategies as indicated/appropriate.    Ashley Murrain PT, DPT 03/15/2023 3:10 PM

## 2023-03-15 ENCOUNTER — Encounter: Payer: Self-pay | Admitting: Physical Therapy

## 2023-03-15 ENCOUNTER — Ambulatory Visit: Payer: Medicare HMO | Attending: Sports Medicine | Admitting: Physical Therapy

## 2023-03-15 DIAGNOSIS — M25521 Pain in right elbow: Secondary | ICD-10-CM | POA: Insufficient documentation

## 2023-03-15 DIAGNOSIS — M6281 Muscle weakness (generalized): Secondary | ICD-10-CM | POA: Diagnosis present

## 2023-03-16 ENCOUNTER — Ambulatory Visit (INDEPENDENT_AMBULATORY_CARE_PROVIDER_SITE_OTHER): Payer: BC Managed Care – PPO | Admitting: Sports Medicine

## 2023-03-16 VITALS — BP 138/78 | Ht 68.0 in | Wt 160.0 lb

## 2023-03-16 DIAGNOSIS — M19042 Primary osteoarthritis, left hand: Secondary | ICD-10-CM

## 2023-03-16 DIAGNOSIS — M67442 Ganglion, left hand: Secondary | ICD-10-CM | POA: Diagnosis not present

## 2023-03-16 DIAGNOSIS — M7711 Lateral epicondylitis, right elbow: Secondary | ICD-10-CM

## 2023-03-16 NOTE — Patient Instructions (Signed)
You were seen for your lateral epicondylitis (tennis elbow) of the right arm and arthritis in the left middle finger. I recommend you continue the meloxicam and tylenol for both these issues.  If you need something additional for the finger you can use Voltaren gel (diclofenac) which can be picked up over the counter at the pharmacy. This can be applied up to every 6 hours as needed. Icing can also be helpful.  Keep up the physical therapy for the right elbow and let's plan to follow-up in 2 months.

## 2023-03-16 NOTE — Assessment & Plan Note (Signed)
Suspected that she has a ganglion cyst on the palmar aspect of the left third DIP joint secondary to her arthritis.  We discussed that since this is nontender nor cosmetically bothering her that I would not recommend any intervention for this at this time, however if it becomes symptomatic or if she would like something done for this we could consider ultrasound evaluation +/- aspiration though it is rather small.

## 2023-03-16 NOTE — Therapy (Signed)
OUTPATIENT PHYSICAL THERAPY TREATMENT NOTE   Patient Name: [name] MRN: [id number] DOB:1960-09-20, 62 y.o., female Today's Date: 03/17/2023  END OF SESSION:  PT End of Session - 03/17/23 1444     Visit Number 6    Number of Visits 17    Date for PT Re-Evaluation 04/11/23    Authorization Type aetna medicare/BCBS    Authorization Time Period no auth per appt notes    PT Start Time 1445    PT Stop Time 1524    PT Time Calculation (min) 39 min    Activity Tolerance Patient tolerated treatment well;No increased pain    Behavior During Therapy WFL for tasks assessed/performed                  Past Medical History:  Diagnosis Date   Arthritis    COPD (chronic obstructive pulmonary disease) (HCC)    Hyperlipidemia    Hypertension 2005   Past Surgical History:  Procedure Laterality Date   AXILLARY LYMPH NODE BIOPSY     BREAST BIOPSY     BREAST EXCISIONAL BIOPSY Left    CESAREAN SECTION     x 1   DIAGNOSTIC LAPAROSCOPY     for persistant right ovarian cyst   Patient Active Problem List   Diagnosis Date Noted   Arthritis of finger of left hand 03/16/2023   Ganglion cyst of finger of left hand 03/16/2023   Lateral epicondylitis of right elbow 05/03/2022   Tubular adenoma 04/09/2018   Elevated ferritin 02/23/2018   Former heavy tobacco smoker 06/15/2017   Alcohol use    COPD (chronic obstructive pulmonary disease) (HCC) 12/21/2016   Hypertension 03/08/2012    PCP: Westley Chandler, MD  REFERRING PROVIDER: Marisa Cyphers, MD  REFERRING DIAG: [id number] (ICD-10-CM) - Right elbow pain  THERAPY DIAG:  Pain in right elbow  Muscle weakness (generalized)  Rationale for Evaluation and Treatment: Rehabilitation  ONSET DATE: about a year ago   SUBJECTIVE:                                                                                                                                                                                      SUBJECTIVE  STATEMENT: 03/17/2023 Felt good after last session, no soreness. Bothering her more today, had  a lot to do at work. Having elbow and wrist pain ~9/10 today with tingling in hand. Pt states visit with physician went well, no concerns, encouraged her to continue w/ PT.   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 Gradual onset about a  year ago denies any change in activities or MOI. Slowly worsening. Has chronic numbness in R thumb from prior surgery, and chronic numbness in R middle finger from childhood injury, but no N/T otherwise. Doesn't notice any swelling. Pt states she has a brace but can't use it at work, does help around the house though.  Hand dominance: Right  PERTINENT HISTORY:  HTN, COPD  PAIN:  Are you having pain: 9/10 Best/worst over past week: 0-8/10  Per eval - Location/description: R forearm, dorsally. aching Best-worst over past week: 0-9/10  - aggravating factors: work activities (lifting, repetitive manual tasks), cooking tasks, sleeping (prolonged bending) - Easing factors: massage, brace  PRECAUTIONS: None   WEIGHT BEARING RESTRICTIONS: No  FALLS:  Has patient fallen in last 6 months? No  LIVING ENVIRONMENT: Apartment, 1 level Lives with son (75 y.o) Pt does majority of housework  OCCUPATION: Works full time as a Financial risk analyst - has to lift up to 25-30#, repetitive tasks  PLOF: Independent  PATIENT GOALS: reduce pain  NEXT MD VISIT: about 8 weeks from beginning of December per pt report  OBJECTIVE:  Note: Objective measures were completed at Evaluation unless otherwise noted.  DIAGNOSTIC FINDINGS:  No imaging noted in chart  PATIENT SURVEYS:  FOTO 60 current, 64 predicted  COGNITION: Overall cognitive status: Within functional limits for tasks assessed     SENSATION: Mildly diminished R thumb and R middle finger (both chronic per pt), otherwise intact and  symmetrical BIL UE   UPPER EXTREMITY ROM:   ROM Right eval Left eval  Elbow flexion 140 deg painless Full   Elbow extension Full * Full   Wrist flexion 65 deg * 70 deg  Wrist extension 55 deg   Wrist ulnar deviation    Wrist radial deviation    Wrist pronation >80 deg   Wrist supination >80 deg   (Blank rows = not tested) (Key: WFL = within functional limits not formally assessed, * = concordant pain, s = stiffness/stretching sensation, NT = not tested)   UPPER EXTREMITY MMT:  MMT Right eval Left eval Right 03/15/23  Elbow flexion 5 5   Elbow extension 4- * 4 4+ *  Wrist flexion 4+ * 4+   Wrist extension 4+ 4+   Wrist radial deviation     Wrist pronation     Wrist supination     Grip strength (lbs) 30# 45# 45#  (Blank rows = not tested) (Key: WFL = within functional limits not formally assessed, * = concordant pain, s = stiffness/stretching sensation, NT = not tested)  Comment:   JOINT MOBILITY TESTING:  NT  PALPATION:  Concordant tenderness lateral triceps, lateral epicondyle, wrist extensors, brachioradialis    TODAY'S TREATMENT:    [facility] Adult PT Treatment:                                                DATE: 03/17/23 Therapeutic Exercise: Wrist extension stretch 3x30sec RUE Wrist flexion stretch 3x30sec RUE Yellow band finger splays 2x15 w/ education on home setup Yellow band wrist ext in neutral 2x15 education on home setup Green band bicep curl 2x10 Green band tricep pull 2x10 Blue flexbar U 2x8  Blue flexbar arch 2x8 Black band row x8 HEP handout + education  Manual Therapy: Seated w/ elbow support; STM wrist extensor bundle, distal triceps, brachioradialis Trigger point release throughout dorsal  aspect of forearm Active pin and stretch wrist extensors + active wrist extension x5   [facility] Adult PT Treatment:                                                DATE: 03/15/23 Therapeutic Exercise: Blue band row, towel grip 2x12 Blue band shoulder ext towel  grip 2x12 Bicep focused face pulls at CC w/ towel grip; 7# 2x12 cues for positioning and form Tricep push down CC 7# 2x8 cues for reduced elbow compensations (has elbow cavitation on first set, nonpainful and no issues afterwards) Supported wrist flexion 3# 2x8 Yellow T bar U's x12 cues for elbow positioning Yellow T bar arches x12 cues for elbow positioning Yellow band wrist ext in neutral, elbow supported 2x8 Yellow band finger splays 2x12 Wrist flexion stretch 3x30sec hold Wrist extension stretch 3x30sec hold    [facility] Adult PT Treatment:                                                DATE: 03/08/23 Therapeutic Exercise: Towel grip blue band row; 3x10 cues for posture and reduced elbow compensations Towel grip blue band shoulder ext 2x10 cues for posture Wrist ext isometrics 2x5 in wrist extended position Wrist ext isometric 2x5 in neutral position Elbow flexion isometric in neutral position x8  Yellow T bar U 2x5 Yellow T bar arch 2x5 HEP update + education/handout  Manual Therapy: Seated w/ elbow supported, RUE STM wrist extensor bundle, brachioradialis, tricep Passive pin and stretch wrist extensor bundle into wrist flexion in pronated and neutral positions Trigger point release brachioradialis, wrist extensor bundle Radial head mobs grade 2-3 with pt reporting good relief with repetition  Therapeutic Activity: Towel grip BIL KB 10# x72ft, 1 lap pronated, 1 lap supinated Suitcase carry towel grip 5# 148ft cues for posture/grip               PATIENT EDUCATION: Education details: rationale for interventions, HEP  Person educated: Patient Education method: Explanation, Demonstration, Tactile cues, Verbal cues Education comprehension: verbalized understanding, returned demonstration, verbal cues required, tactile cues required, and needs further education    HOME EXERCISE PROGRAM: Access Code: [email or web address] URL: [email or web address] Date: 03/17/2023 Prepared  by: Fransisco Hertz  Exercises - Standing Wrist Flexion Stretch  - 2-3 x daily - 7 x weekly - 1 sets - 3 reps - 30 sec hold - Standing Wrist Extension Stretch  - 2-3 x daily - 7 x weekly - 1 sets - 3 reps - 30 sec hold - Seated elbow manual massage clockwise of friction cross wise massage  - 1 x daily - 7 x weekly - 3 sets - 10 reps - Isometric Wrist Extension Pronated  - 2-3 x daily - 1 sets - 8 reps - 3-5sec hold - Standing Shoulder Row with Anchored Resistance  - 2-3 x daily - 1 sets - 10 reps - Finger Spreading with Resistance Band  - 2-3 x daily - 1 sets - 10 reps - Wrist Extension with Resistance  - 2-3 x daily - 1 sets - 10 reps - Standing Single Arm Elbow Flexion with Resistance  - 2-3 x daily - 1 sets - 10 reps   ASSESSMENT:  CLINICAL IMPRESSION: 03/17/2023  Pt arrives w/ 9/10 pain and tingling in hand after work activities today but endorses good response to last session and overall continuing to improve. Today initiating with manual and stretching for symptom modification w/ good relief. Following this with more direct wrist/elbow resistance training which pt tolerates well. Reports resolution of tingling and pain down to 5/10 at end of session, no adverse events. In discussion with pt she states she would like to discharge next session if continues along current trajectory as she feels she can manage independently with exercise and self massage. Recommend assessing goals and discharge plan next session. Pt departs today's session in no acute distress, all voiced questions/concerns addressed appropriately from PT perspective.      EVAL Patient is a pleasant 62 y.o. woman who was seen today for physical therapy evaluation and treatment for R elbow pain ongoing for past year without overt MOI but does endorse repetitive manual tasks for work. Endorses increased symptom irritability with lifting and other manual tasks. On exam she demonstrates weakness at R elbow and wrist, reduced wrist  mobility on RUE, and concordant tenderness to palpation of wrist extensors and triceps. Given repetitive nature of her job and lifting demands, anticipate pt would benefit from strengthening of aforementioned musculature and skilled PT to address relevant deficits. Tolerates HEP well with muscular fatigue but no reported increase in pain, no adverse events. Pt departs today's session in no acute distress, all voiced concerns/questions addressed appropriately from PT perspective.    OBJECTIVE IMPAIRMENTS: decreased activity tolerance, decreased endurance, decreased mobility, decreased ROM, decreased strength, impaired UE functional use, and pain.   ACTIVITY LIMITATIONS: carrying, lifting, and sleeping  PARTICIPATION LIMITATIONS: meal prep, cleaning, laundry, community activity, and occupation  PERSONAL FACTORS: Age, Time since onset of injury/illness/exacerbation, and 1-2 comorbidities: HTN, COPD  are also affecting patient's functional outcome.   REHAB POTENTIAL: Good  CLINICAL DECISION MAKING: Stable/uncomplicated  EVALUATION COMPLEXITY: Low   GOALS: Goals reviewed with patient? Yes  SHORT TERM GOALS: Target date: 03/14/2023 Pt will demonstrate appropriate understanding and performance of initially prescribed HEP in order to facilitate improved independence with management of symptoms.  Baseline: HEP provided on eval 03/15/23: reports daily HEP adherence Goal status: MET  2. Pt will report at least 25% decrease in overall pain levels in past week in order to facilitate improved tolerance to basic ADLs/mobility.   Baseline: 0-9/10 03/15/23: 0-8/10 Goal status: ONGOING    LONG TERM GOALS: Target date: 04/11/2023 Pt will score 64 on FOTO in order to demonstrate improved perception of function due to symptoms. Baseline: 60 Goal status: INITIAL  2.  Pt will demonstrate grossly symmetrical wrist AROM without pain in order to facilitate improved tolerance to daily tasks. Baseline: see ROM  chart above Goal status: INITIAL  3.  Pt will demonstrate grip strength that is at least 5# from symmetrical in order to demonstrate improved functional strength. Baseline: see MMT chart above Goal status: INITIAL  4. Pt will report/demonstrate ability to lift up to 30# for 3 repetitions with less than 2 point increase in pain on NPS in order to indicate improved tolerance/independence with work tasks.  Baseline: NT on eval, pt endorses pain with lifting at work  Goal status: INITIAL   5. Pt will report at least 50% decrease in overall pain levels in past week in order to facilitate improved tolerance to basic ADLs/mobility.   Baseline: 0-9/10  Goal status: INITIAL    PLAN:  PT FREQUENCY: 1-2x/week  PT DURATION: 8 weeks  PLANNED INTERVENTIONS: 97164- PT Re-evaluation, 97110-Therapeutic exercises, 97530- Therapeutic activity, [id number]- Neuromuscular re-education, 97535- Self Care, 11914- Manual therapy, [phone]- Gait training, Patient/Family education, Balance training, Stair training, Taping, Dry Needling, Joint mobilization, Cryotherapy, and Moist heat  PLAN FOR NEXT SESSION: Review/update HEP PRN. Work on [employer] exercises as appropriate with emphasis on wrist extensor and triceps strength, wrist mobility, and grip strength. Symptom modification strategies as indicated/appropriate.    Ashley Murrain PT, DPT 03/17/2023 3:28 PM

## 2023-03-16 NOTE — Assessment & Plan Note (Addendum)
This is an acute on chronic issue for her.  She has arthritis in multiple of her fingers though is currently symptomatic in her third DIP joint.  I recommended icing, relative rest, and as needed topical Voltaren for this.  Patient expressed understanding.  Will monitor this moving forward.

## 2023-03-16 NOTE — Assessment & Plan Note (Addendum)
This has been a chronic issue for her.  We did briefly discuss the idea of cortisone injections though again reviewed the lack of long-term benefit the patient is not presently interested in this.  Will continue her on the meloxicam in addition to her therapy, denies needing refill at this time.  Recommended continuation of bracing as often as she can tolerate this.  Follow-up in 2 months to ensure this is continue to get better with time and therapy.

## 2023-03-16 NOTE — Progress Notes (Signed)
PCP: Westley Chandler, MD  SUBJECTIVE:   HPI:  Patient is a 62 y.o. female here for follow-up re: tennis elbow right arm.  She notes that over the past 2 months she has noticed some improvement regarding the pain in the right elbow.  She notes particularly benefit from physical therapy.  She has been using her wrist braces at home, however she cannot use these at work as she is having to clean dishes many times a day requiring her to get her hands wet. Work does still exacerbate her sxs.   She also was unable to tolerate the nitroglycerin patches secondary to headaches that were persisting beyond the first couple weeks of use.  She also mentions a few months of intermittent pain and stiffness of her left third finger.  She has a history of hand arthritis.  She also notes a small mass on the side of the third DIP joint. She isn't bothered by the mass, but the pain of the DIP joint does cause her discomfort particularly when cleaning dishes/using her hands at work.  ROS:     See HPI  PERTINENT  PMH / PSH FH / / SH:  Past Medical, Surgical, Social, and Family History Reviewed & Updated in the EMR.  Pertinent findings include:  Arthritis, HTN  No Known Allergies  OBJECTIVE:  BP 138/78   Ht 5\' 8"  (1.727 m)   Wt 160 lb (72.6 kg)   LMP 04/12/2004   BMI 24.33 kg/m   PHYSICAL EXAM:  GEN: Alert and Oriented, NAD, comfortable in exam room RESP: Unlabored respirations, symmetric chest rise PSY: normal mood, congruent affect   Right Elbow MSK exam: No deformity, erythema, ecchymosis, atrophy or swelling. She has full range of motion of the elbow in extension, flexion, supination and pronation.  Right wrist and digits also with full range of motion. Strength 5 out of 5 throughout. No tenderness to palpation over the lateral epicondyle on today's exam, mild discomfort here with resisted wrist extension.   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  There is a prominent heberden node of the left 3rd DIP joint. No significant swelling or overlaying skin color changes. ROM is full. TTP on dorsal 3rd DIP joint, remainder of fingers and hand non-tender. There is a palpable small ~30mm firm, mobile, round mass on the palmer aspect of the 3rd DIP joint. This is non-tender to palpation. NVI distally. 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  We did briefly discuss the idea of cortisone injections though again reviewed the lack of long-term benefit the patient is not presently interested in this.  Will continue her on the meloxicam in addition to her therapy, denies needing refill at this time.  Recommended continuation of bracing as often as she can tolerate this.  Follow-up in 2 months to ensure this is continue to get better with time and therapy. Arthritis of finger of left hand This is an acute on chronic issue for her.  She has arthritis in multiple of her fingers though is currently symptomatic in her third DIP joint.  I recommended icing, relative rest, and as needed topical Voltaren for this.  Patient expressed understanding.  Will monitor this moving forward. Ganglion cyst of finger of left hand Suspected that she has a ganglion cyst on the palmar aspect of the left third DIP joint secondary to her arthritis.  We discussed that since this is  nontender nor cosmetically bothering her that I would not recommend any intervention for this at this time, however if it becomes symptomatic or if she would like something done for this we could consider ultrasound evaluation +/- aspiration though it is rather small.  Patient's questions were answered and they are in agreement with this plan.  Glean Salen, MD PGY-4, Sports Medicine Fellow [facility]  Addendum:  Patient seen in the office by fellow.  His history, exam, plan of care were precepted with me.  Norton Blizzard MD  Marrianne Mood

## 2023-03-17 ENCOUNTER — Encounter: Payer: Self-pay | Admitting: Physical Therapy

## 2023-03-17 ENCOUNTER — Ambulatory Visit: Payer: Medicare HMO | Admitting: Physical Therapy

## 2023-03-17 DIAGNOSIS — M6281 Muscle weakness (generalized): Secondary | ICD-10-CM

## 2023-03-17 DIAGNOSIS — M25521 Pain in right elbow: Secondary | ICD-10-CM | POA: Diagnosis not present

## 2023-03-22 ENCOUNTER — Encounter
Admit: 2023-03-22 | Payer: PRIVATE HEALTH INSURANCE | Attending: Vascular and Interventional Radiology | Primary: Internal Medicine

## 2023-03-22 ENCOUNTER — Encounter: Payer: Medicare HMO | Admitting: Physical Therapy

## 2023-03-23 NOTE — Therapy (Signed)
OUTPATIENT PHYSICAL THERAPY TREATMENT NOTE   Patient Name: [name] MRN: [id number] DOB:1960-09-19, 62 y.o., female Today's Date: 03/23/2023  END OF SESSION:         Past Medical History:  Diagnosis Date   Arthritis    COPD (chronic obstructive pulmonary disease) (HCC)    Hyperlipidemia    Hypertension 2005   Past Surgical History:  Procedure Laterality Date   AXILLARY LYMPH NODE BIOPSY     BREAST BIOPSY     BREAST EXCISIONAL BIOPSY Left    CESAREAN SECTION     x 1   DIAGNOSTIC LAPAROSCOPY     for persistant right ovarian cyst   Patient Active Problem List   Diagnosis Date Noted   Arthritis of finger of left hand 03/16/2023   Ganglion cyst of finger of left hand 03/16/2023   Lateral epicondylitis of right elbow 05/03/2022   Tubular adenoma 04/09/2018   Elevated ferritin 02/23/2018   Former heavy tobacco smoker 06/15/2017   Alcohol use    COPD (chronic obstructive pulmonary disease) (HCC) 12/21/2016   Hypertension 03/08/2012    PCP: Westley Chandler, MD  REFERRING PROVIDER: Marisa Cyphers, MD  REFERRING DIAG: [phone] (ICD-10-CM) - Right elbow pain  THERAPY DIAG:  No diagnosis found.  Rationale for Evaluation and Treatment: Rehabilitation  ONSET DATE: about a year ago   SUBJECTIVE:                                                                                                                                                                                      SUBJECTIVE STATEMENT: 03/23/2023 Felt good after last session, no soreness. Bothering her more today, had  a lot to do at work. Having elbow and wrist pain ~9/10 today with tingling in hand. Pt states visit with physician went well, no concerns, encouraged her to continue w/ PT.   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 Gradual onset about a year ago denies any change in activities or MOI.  Slowly worsening. Has chronic numbness in R thumb from prior surgery, and chronic numbness in R middle finger from childhood injury, but no N/T otherwise. Doesn't notice any swelling. Pt states she has a brace but can't use it at work, does help around the house though.  Hand dominance: Right  PERTINENT HISTORY:  HTN, COPD  PAIN:  Are you having pain: 9/10 Best/worst over past week: 0-8/10  Per eval - Location/description: R forearm, dorsally. aching Best-worst over past week: 0-9/10  - aggravating factors: work activities (lifting, repetitive manual tasks), cooking tasks, sleeping (  prolonged bending) - Easing factors: massage, brace  PRECAUTIONS: None   WEIGHT BEARING RESTRICTIONS: No  FALLS:  Has patient fallen in last 6 months? No  LIVING ENVIRONMENT: Apartment, 1 level Lives with son (31 y.o) Pt does majority of housework  OCCUPATION: Works full time as a Financial risk analyst - has to lift up to 25-30#, repetitive tasks  PLOF: Independent  PATIENT GOALS: reduce pain  NEXT MD VISIT: about 8 weeks from beginning of December per pt report  OBJECTIVE:  Note: Objective measures were completed at Evaluation unless otherwise noted.  DIAGNOSTIC FINDINGS:  No imaging noted in chart  PATIENT SURVEYS:  FOTO 60 current, 64 predicted  COGNITION: Overall cognitive status: Within functional limits for tasks assessed     SENSATION: Mildly diminished R thumb and R middle finger (both chronic per pt), otherwise intact and symmetrical BIL UE   UPPER EXTREMITY ROM:   ROM Right eval Left eval  Elbow flexion 140 deg painless Full   Elbow extension Full * Full   Wrist flexion 65 deg * 70 deg  Wrist extension 55 deg   Wrist ulnar deviation    Wrist radial deviation    Wrist pronation >80 deg   Wrist supination >80 deg   (Blank rows = not tested) (Key: WFL = within functional limits not formally assessed, * = concordant pain, s = stiffness/stretching sensation, NT = not tested)    UPPER EXTREMITY MMT:  MMT Right eval Left eval Right 03/15/23  Elbow flexion 5 5   Elbow extension 4- * 4 4+ *  Wrist flexion 4+ * 4+   Wrist extension 4+ 4+   Wrist radial deviation     Wrist pronation     Wrist supination     Grip strength (lbs) 30# 45# 45#  (Blank rows = not tested) (Key: WFL = within functional limits not formally assessed, * = concordant pain, s = stiffness/stretching sensation, NT = not tested)  Comment:   JOINT MOBILITY TESTING:  NT  PALPATION:  Concordant tenderness lateral triceps, lateral epicondyle, wrist extensors, brachioradialis    TODAY'S TREATMENT:    [facility] Adult PT Treatment:                                                DATE: 03/17/23 Therapeutic Exercise: Wrist extension stretch 3x30sec RUE Wrist flexion stretch 3x30sec RUE Yellow band finger splays 2x15 w/ education on home setup Yellow band wrist ext in neutral 2x15 education on home setup Green band bicep curl 2x10 Green band tricep pull 2x10 Blue flexbar U 2x8  Blue flexbar arch 2x8 Black band row x8 HEP handout + education  Manual Therapy: Seated w/ elbow support; STM wrist extensor bundle, distal triceps, brachioradialis Trigger point release throughout dorsal aspect of forearm Active pin and stretch wrist extensors + active wrist extension x5   [facility] Adult PT Treatment:                                                DATE: 03/15/23 Therapeutic Exercise: Blue band row, towel grip 2x12 Blue band shoulder ext towel grip 2x12 Bicep focused face pulls at CC w/ towel grip; 7# 2x12 cues for positioning and form Tricep push down CC 7# 2x8  cues for reduced elbow compensations (has elbow cavitation on first set, nonpainful and no issues afterwards) Supported wrist flexion 3# 2x8 Yellow T bar U's x12 cues for elbow positioning Yellow T bar arches x12 cues for elbow positioning Yellow band wrist ext in neutral, elbow supported 2x8 Yellow band finger splays 2x12 Wrist flexion stretch  3x30sec hold Wrist extension stretch 3x30sec hold    [facility] Adult PT Treatment:                                                DATE: 03/08/23 Therapeutic Exercise: Towel grip blue band row; 3x10 cues for posture and reduced elbow compensations Towel grip blue band shoulder ext 2x10 cues for posture Wrist ext isometrics 2x5 in wrist extended position Wrist ext isometric 2x5 in neutral position Elbow flexion isometric in neutral position x8  Yellow T bar U 2x5 Yellow T bar arch 2x5 HEP update + education/handout  Manual Therapy: Seated w/ elbow supported, RUE STM wrist extensor bundle, brachioradialis, tricep Passive pin and stretch wrist extensor bundle into wrist flexion in pronated and neutral positions Trigger point release brachioradialis, wrist extensor bundle Radial head mobs grade 2-3 with pt reporting good relief with repetition  Therapeutic Activity: Towel grip BIL KB 10# x34ft, 1 lap pronated, 1 lap supinated Suitcase carry towel grip 5# 134ft cues for posture/grip               PATIENT EDUCATION: Education details: rationale for interventions, HEP  Person educated: Patient Education method: Explanation, Demonstration, Tactile cues, Verbal cues Education comprehension: verbalized understanding, returned demonstration, verbal cues required, tactile cues required, and needs further education    HOME EXERCISE PROGRAM: Access Code: [email or web address] URL: [email or web address] Date: 03/17/2023 Prepared by: Fransisco Hertz  Exercises - Standing Wrist Flexion Stretch  - 2-3 x daily - 7 x weekly - 1 sets - 3 reps - 30 sec hold - Standing Wrist Extension Stretch  - 2-3 x daily - 7 x weekly - 1 sets - 3 reps - 30 sec hold - Seated elbow manual massage clockwise of friction cross wise massage  - 1 x daily - 7 x weekly - 3 sets - 10 reps - Isometric Wrist Extension Pronated  - 2-3 x daily - 1 sets - 8 reps - 3-5sec hold - Standing Shoulder Row with Anchored Resistance  -  2-3 x daily - 1 sets - 10 reps - Finger Spreading with Resistance Band  - 2-3 x daily - 1 sets - 10 reps - Wrist Extension with Resistance  - 2-3 x daily - 1 sets - 10 reps - Standing Single Arm Elbow Flexion with Resistance  - 2-3 x daily - 1 sets - 10 reps   ASSESSMENT:  CLINICAL IMPRESSION: 03/23/2023 Pt arrives w/ 9/10 pain and tingling in hand after work activities today but endorses good response to last session and overall continuing to improve. Today initiating with manual and stretching for symptom modification w/ good relief. Following this with more direct wrist/elbow resistance training which pt tolerates well. Reports resolution of tingling and pain down to 5/10 at end of session, no adverse events. In discussion with pt she states she would like to discharge next session if continues along current trajectory as she feels she can manage independently with exercise and self massage. Recommend assessing goals and discharge plan next session. Pt departs today's  session in no acute distress, all voiced questions/concerns addressed appropriately from PT perspective.      EVAL Patient is a pleasant 62 y.o. woman who was seen today for physical therapy evaluation and treatment for R elbow pain ongoing for past year without overt MOI but does endorse repetitive manual tasks for work. Endorses increased symptom irritability with lifting and other manual tasks. On exam she demonstrates weakness at R elbow and wrist, reduced wrist mobility on RUE, and concordant tenderness to palpation of wrist extensors and triceps. Given repetitive nature of her job and lifting demands, anticipate pt would benefit from strengthening of aforementioned musculature and skilled PT to address relevant deficits. Tolerates HEP well with muscular fatigue but no reported increase in pain, no adverse events. Pt departs today's session in no acute distress, all voiced concerns/questions addressed appropriately from PT  perspective.    OBJECTIVE IMPAIRMENTS: decreased activity tolerance, decreased endurance, decreased mobility, decreased ROM, decreased strength, impaired UE functional use, and pain.   ACTIVITY LIMITATIONS: carrying, lifting, and sleeping  PARTICIPATION LIMITATIONS: meal prep, cleaning, laundry, community activity, and occupation  PERSONAL FACTORS: Age, Time since onset of injury/illness/exacerbation, and 1-2 comorbidities: HTN, COPD  are also affecting patient's functional outcome.   REHAB POTENTIAL: Good  CLINICAL DECISION MAKING: Stable/uncomplicated  EVALUATION COMPLEXITY: Low   GOALS: Goals reviewed with patient? Yes  SHORT TERM GOALS: Target date: 03/14/2023 Pt will demonstrate appropriate understanding and performance of initially prescribed HEP in order to facilitate improved independence with management of symptoms.  Baseline: HEP provided on eval 03/15/23: reports daily HEP adherence Goal status: MET  2. Pt will report at least 25% decrease in overall pain levels in past week in order to facilitate improved tolerance to basic ADLs/mobility.   Baseline: 0-9/10 03/15/23: 0-8/10 Goal status: ONGOING    LONG TERM GOALS: Target date: 04/11/2023 Pt will score 64 on FOTO in order to demonstrate improved perception of function due to symptoms. Baseline: 60 Goal status: INITIAL  2.  Pt will demonstrate grossly symmetrical wrist AROM without pain in order to facilitate improved tolerance to daily tasks. Baseline: see ROM chart above Goal status: INITIAL  3.  Pt will demonstrate grip strength that is at least 5# from symmetrical in order to demonstrate improved functional strength. Baseline: see MMT chart above Goal status: INITIAL  4. Pt will report/demonstrate ability to lift up to 30# for 3 repetitions with less than 2 point increase in pain on NPS in order to indicate improved tolerance/independence with work tasks.  Baseline: NT on eval, pt endorses pain with lifting at  work  Goal status: INITIAL   5. Pt will report at least 50% decrease in overall pain levels in past week in order to facilitate improved tolerance to basic ADLs/mobility.   Baseline: 0-9/10  Goal status: INITIAL    PLAN:  PT FREQUENCY: 1-2x/week  PT DURATION: 8 weeks  PLANNED INTERVENTIONS: 97164- PT Re-evaluation, 97110-Therapeutic exercises, 97530- Therapeutic activity, 97112- Neuromuscular re-education, 97535- Self Care, 78295- Manual therapy, [phone]- Gait training, Patient/Family education, Balance training, Stair training, Taping, Dry Needling, Joint mobilization, Cryotherapy, and Moist heat  PLAN FOR NEXT SESSION: Review/update HEP PRN. Work on [employer] exercises as appropriate with emphasis on wrist extensor and triceps strength, wrist mobility, and grip strength. Symptom modification strategies as indicated/appropriate.    Ashley Murrain PT, DPT 03/23/2023 11:36 AM

## 2023-03-24 ENCOUNTER — Encounter: Payer: Self-pay | Admitting: Physical Therapy

## 2023-03-24 ENCOUNTER — Ambulatory Visit: Payer: Medicare HMO | Admitting: Physical Therapy

## 2023-03-24 DIAGNOSIS — M6281 Muscle weakness (generalized): Secondary | ICD-10-CM

## 2023-03-24 DIAGNOSIS — M25521 Pain in right elbow: Secondary | ICD-10-CM | POA: Diagnosis not present

## 2023-03-30 ENCOUNTER — Ambulatory Visit (AMBULATORY_SURGERY_CENTER): Payer: BC Managed Care – PPO | Admitting: Gastroenterology

## 2023-03-30 ENCOUNTER — Encounter: Payer: Self-pay | Admitting: Gastroenterology

## 2023-03-30 VITALS — BP 129/78 | HR 67 | Temp 98.1°F | Resp 12 | Ht 68.0 in | Wt 160.0 lb

## 2023-03-30 DIAGNOSIS — K621 Rectal polyp: Secondary | ICD-10-CM | POA: Diagnosis not present

## 2023-03-30 DIAGNOSIS — K6289 Other specified diseases of anus and rectum: Secondary | ICD-10-CM | POA: Diagnosis not present

## 2023-03-30 DIAGNOSIS — K5730 Diverticulosis of large intestine without perforation or abscess without bleeding: Secondary | ICD-10-CM

## 2023-03-30 DIAGNOSIS — Z1211 Encounter for screening for malignant neoplasm of colon: Secondary | ICD-10-CM

## 2023-03-30 DIAGNOSIS — Z860100 Personal history of colon polyps, unspecified: Secondary | ICD-10-CM

## 2023-03-30 MED ORDER — SODIUM CHLORIDE 0.9 % IV SOLN: 500.0000 mL | INTRAVENOUS | Status: DC

## 2023-03-30 NOTE — Progress Notes (Signed)
Report given to PACU, vss 

## 2023-03-30 NOTE — Patient Instructions (Addendum)
- Resume previous diet.                           - Continue present medications.                           - Repeat colonoscopy in 10 years for surveillance                           - Handouts on findings given to patient (diverticulosis)   YOU HAD AN ENDOSCOPIC PROCEDURE TODAY AT THE [facility]:   Refer to the procedure report that was given to you for any specific questions about what was found during the examination.  If the procedure report does not answer your questions, please call your gastroenterologist to clarify.  If you requested that your care partner not be given the details of your procedure findings, then the procedure report has been included in a sealed envelope for you to review at your convenience later.  YOU SHOULD EXPECT: Some feelings of bloating in the abdomen. Passage of more gas than usual.  Walking can help get rid of the air that was put into your GI tract during the procedure and reduce the bloating. If you had a lower endoscopy (such as a colonoscopy or flexible sigmoidoscopy) you may notice spotting of blood in your stool or on the toilet paper. If you underwent a bowel prep for your procedure, you may not have a normal bowel movement for a few days.  Please Note:  You might notice some irritation and congestion in your nose or some drainage.  This is from the oxygen used during your procedure.  There is no need for concern and it should clear up in a day or so.  SYMPTOMS TO REPORT IMMEDIATELY:  Following lower endoscopy (colonoscopy or flexible sigmoidoscopy):  Excessive amounts of blood in the stool  Significant tenderness or worsening of abdominal pains  Swelling of the abdomen that is new, acute  Fever of 100F or higher   For urgent or emergent issues, a gastroenterologist can be reached at any hour by calling [phone]. Do not use MyChart messaging for urgent concerns.    DIET:  We do recommend a small meal at  first, but then you may proceed to your regular diet.  Drink plenty of fluids but you should avoid alcoholic beverages for 24 hours.  ACTIVITY:  You should plan to take it easy for the rest of today and you should NOT DRIVE or use heavy machinery until tomorrow (because of the sedation medicines used during the test).    FOLLOW UP: Our staff will call the number listed on your records the next business day following your procedure.  We will call around 7:15- 8:00 am to check on you and address any questions or concerns that you may have regarding the information given to you following your procedure. If we do not reach you, we will leave a message.     If any biopsies were taken you will be contacted by phone or by letter within the next 1-3 weeks.  Please call us at [phone] if you have not heard about the biopsies in 3 weeks.    SIGNATURES/CONFIDENTIALITY: You and/or your care partner have signed paperwork which will be entered into your electronic medical record.  These signatures attest to the fact that that  the information above on your After Visit Summary has been reviewed and is understood.  Full responsibility of the confidentiality of this discharge information lies with you and/or your care-partner.

## 2023-03-30 NOTE — Progress Notes (Signed)
Pt's states no medical or surgical changes since previsit or office visit. 

## 2023-03-30 NOTE — Op Note (Signed)
[facility] Patient Name: [name] Procedure Date: 03/30/2023 9:36 AM MRN: [id number] Endoscopist: Viviann Spare P. Adela Lank , MD, [id number] Age: 62 Referring MD:  Date of Birth: [birth year] Gender: Female Account #: [id number] Procedure:                Colonoscopy Indications:              High risk colon cancer surveillance: Personal                            history of colonic polyps - small adenoma 03/2018 Medicines:                Monitored Anesthesia Care Procedure:                Pre-Anesthesia Assessment:                           - Prior to the procedure, a History and Physical                            was performed, and patient medications and                            allergies were reviewed. The patient's tolerance of                            previous anesthesia was also reviewed. The risks                            and benefits of the procedure and the sedation                            options and risks were discussed with the patient.                            All questions were answered, and informed consent                            was obtained. Prior Anticoagulants: The patient has                            taken no anticoagulant or antiplatelet agents. ASA                            Grade Assessment: III - A patient with severe                            systemic disease. After reviewing the risks and                            benefits, the patient was deemed in satisfactory                            condition to undergo the procedure.  After obtaining informed consent, the colonoscope                            was passed under direct vision. Throughout the                            procedure, the patient's blood pressure, pulse, and                            oxygen saturations were monitored continuously. The                            Olympus Scope [id number] was introduced through the                             anus and advanced to the the cecum, identified by                            appendiceal orifice and ileocecal valve. The                            colonoscopy was performed without difficulty. The                            patient tolerated the procedure well. The quality                            of the bowel preparation was good. The ileocecal                            valve, appendiceal orifice, and rectum were                            photographed. Scope In: 9:46:41 AM Scope Out: 10:01:16 AM Scope Withdrawal Time: 0 hours 10 minutes 48 seconds  Total Procedure Duration: 0 hours 14 minutes 35 seconds  Findings:                 The perianal and digital rectal examinations were                            normal.                           Multiple small-mouthed diverticula were found in                            the left colon and right colon.                           Anal papilla(e) were hypertrophied.                           There were some small benign appearing hyperplastic  polyps in the rectosigmoid colon. The exam was                            otherwise without abnormality. Complications:            No immediate complications. Estimated blood loss:                            None. Estimated Blood Loss:     Estimated blood loss: none. Impression:               - Diverticulosis in the left colon and in the right                            colon.                           - Anal papilla(e) were hypertrophied.                           - Benign small left sided hyperplastic polyps.                           - The examination was otherwise normal.                           - No specimens collected. Recommendation:           - Patient has a contact number available for                            emergencies. The signs and symptoms of potential                            delayed complications were discussed with the                             patient. Return to normal activities tomorrow.                            Written discharge instructions were provided to the                            patient.                           - Resume previous diet.                           - Continue present medications.                           - Repeat colonoscopy in 10 years for surveillance. Viviann Spare P. Akila Batta, MD 03/30/2023 10:08:25 AM This report has been signed electronically.

## 2023-03-30 NOTE — Progress Notes (Signed)
[facility] History and Physical   Primary Care Physician:  Westley Chandler, MD   Reason for Procedure:   History of colon polyps  Plan:    colonoscopy     HPI: [name] is a 62 y.o. female  here for colonoscopy surveillance - last exam done 03/2018 - adenoma removed.    Patient denies any bowel symptoms at this time. No family history of colon cancer known. Otherwise feels well without any cardiopulmonary symptoms.   I have discussed risks / benefits of anesthesia and endoscopic procedure with Cristobal Goldmann and they wish to proceed with the exams as outlined today.    Past Medical History:  Diagnosis Date   Arthritis    COPD (chronic obstructive pulmonary disease) (HCC)    Hyperlipidemia    Hypertension 2005    Past Surgical History:  Procedure Laterality Date   AXILLARY LYMPH NODE BIOPSY     BREAST BIOPSY     BREAST EXCISIONAL BIOPSY Left    CESAREAN SECTION     x 1   DIAGNOSTIC LAPAROSCOPY     for persistant right ovarian cyst    Prior to Admission medications   Medication Sig Start Date End Date Taking? Authorizing Provider  amLODipine (NORVASC) 2.5 MG tablet Take 1 tablet (2.5 mg total) by mouth daily. 06/18/22  Yes Kathrin Ruddy, RPH-CPP  atorvastatin (LIPITOR) 20 MG tablet Take 1 tablet (20 mg total) by mouth daily. 02/22/23  Yes Westley Chandler, MD  famotidine (PEPCID) 20 MG tablet Take 1 tablet (20 mg total) by mouth 2 (two) times daily. Please schedule visit before next fill 12/14/22  Yes Westley Chandler, MD  meloxicam (MOBIC) 15 MG tablet Take 1 tablet (15 mg total) by mouth daily. 12/08/22  Yes Marisa Cyphers, MD  umeclidinium-vilanterol [facility] ELLIPTA) 62.5-25 MCG/ACT AEPB Inhale 1 puff into the lungs daily. 01/31/23  Yes Westley Chandler, MD  acetaminophen (TYLENOL) 500 MG tablet Take 1,000 mg by mouth every 6 (six) hours as needed.    [provider]  albuterol (VENTOLIN HFA) 108 (90 Base) MCG/ACT inhaler Inhale 2 puffs into  the lungs every 6 (six) hours as needed for wheezing or shortness of breath. 01/31/23   Westley Chandler, MD  diclofenac Sodium (PENNSAID) 2 % SOLN Apply 2 Pump (40 mg total) topically 2 (two) times daily as needed. Apply to right elbow Patient not taking: Reported on 03/30/2023 11/25/22   Westley Chandler, MD  hydrochlorothiazide (HYDRODIURIL) 25 MG tablet Take 1 tablet (25 mg total) by mouth daily. 05/03/22   Levin Erp, MD  triamcinolone ointment (KENALOG) 0.5 % Apply 1 Application topically 2 (two) times daily. Apply to right foot 11/25/22   Westley Chandler, MD  Vitamin D, Ergocalciferol, (DRISDOL) 1.25 MG (50000 UNIT) CAPS capsule Take by mouth once a week. Patient not taking: Reported on 03/30/2023 02/22/23   [provider]  Zoster Vaccine Adjuvanted [facility]) injection Inject 0.5 mLs into the muscle every 2 (two) months for 2 doses. 01/31/23 04/02/23  Westley Chandler, MD    Current Outpatient Medications  Medication Sig Dispense Refill   amLODipine (NORVASC) 2.5 MG tablet Take 1 tablet (2.5 mg total) by mouth daily. 90 tablet 3   atorvastatin (LIPITOR) 20 MG tablet Take 1 tablet (20 mg total) by mouth daily. 90 tablet 3   famotidine (PEPCID) 20 MG tablet Take 1 tablet (20 mg total) by mouth 2 (two) times daily. Please schedule visit before next  fill 180 tablet 0   meloxicam (MOBIC) 15 MG tablet Take 1 tablet (15 mg total) by mouth daily. 30 tablet 2   umeclidinium-vilanterol (ANORO ELLIPTA) 62.5-25 MCG/ACT AEPB Inhale 1 puff into the lungs daily. 60 each 3   acetaminophen (TYLENOL) 500 MG tablet Take 1,000 mg by mouth every 6 (six) hours as needed.     albuterol (VENTOLIN HFA) 108 (90 Base) MCG/ACT inhaler Inhale 2 puffs into the lungs every 6 (six) hours as needed for wheezing or shortness of breath. 8 g 0   diclofenac Sodium (PENNSAID) 2 % SOLN Apply 2 Pump (40 mg total) topically 2 (two) times daily as needed. Apply to right elbow (Patient not taking: Reported on 03/30/2023)  112 g 3   hydrochlorothiazide (HYDRODIURIL) 25 MG tablet Take 1 tablet (25 mg total) by mouth daily. 90 tablet 3   triamcinolone ointment (KENALOG) 0.5 % Apply 1 Application topically 2 (two) times daily. Apply to right foot 60 g 1   Vitamin D, Ergocalciferol, (DRISDOL) 1.25 MG (50000 UNIT) CAPS capsule Take by mouth once a week. (Patient not taking: Reported on 03/30/2023)     Zoster Vaccine Adjuvanted [facility]) injection Inject 0.5 mLs into the muscle every 2 (two) months for 2 doses. 1 mL 0   Current Facility-Administered Medications  Medication Dose Route Frequency Provider Last Rate Last Admin   0.9 %  sodium chloride infusion  500 mL Intravenous Continuous Burel Kahre, Willaim Rayas, MD        Allergies as of 03/30/2023   (No Known Allergies)    Family History  Problem Relation Age of Onset   Heart disease Mother    Diabetes Mellitus II Mother    Hypertension Mother    Prostate cancer Father    Heart disease Father    Hyperlipidemia Sister    Diabetes Mellitus II Brother    Hypertension Brother    Diabetes Mellitus II Brother    Breast cancer Neg Hx    Colon cancer Neg Hx    Esophageal cancer Neg Hx    Rectal cancer Neg Hx    Stomach cancer Neg Hx    Colon polyps Neg Hx     Social History   Socioeconomic History   Marital status: Single    Spouse name: Not on file   Number of children: Not on file   Years of education: Not on file   Highest education level: Not on file  Occupational History   Not on file  Tobacco Use   Smoking status: Former    Current packs/day: 0.00    Average packs/day: 0.3 packs/day for 39.7 years (9.9 ttl pk-yrs)    Types: Cigarettes    Start date: 04/12/1977    Quit date: 12/20/2016    Years since quitting: 6.2   Smokeless tobacco: Never   Tobacco comments:    Committed to long-term quit 10/10 on 02/28/17  Vaping Use   Vaping status: Never Used  Substance and Sexual Activity   Alcohol use: Yes    Alcohol/week: 10.0 standard drinks of  alcohol    Types: 10 Standard drinks or equivalent per week    Comment: 2 drinks a day   Drug use: No   Sexual activity: Yes    Partners: Male    Birth control/protection: Post-menopausal  Other Topics Concern   Not on file  Social History Narrative   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  Has worked there for 14 years.  Single, two sons >  18.  Both sons live with her along with dog.  No other house occupants.   Social Drivers of Corporate investment banker Strain: Not on file  Food Insecurity: Not on file  Transportation Needs: Not on file  Physical Activity: Not on file  Stress: Not on file  Social Connections: Not on file  Intimate Partner Violence: Not on file    Review of Systems: All other review of systems negative except as mentioned in the HPI.  Physical Exam: Vital signs BP (!) 147/85   Pulse 79   Temp 98.1 F (36.7 C)   Ht 5\' 8"  (1.727 m)   Wt 160 lb (72.6 kg)   LMP 04/12/2004   SpO2 99%   BMI 24.33 kg/m   General:   Alert,  Well-developed, pleasant and cooperative in NAD Lungs:  Clear throughout to auscultation.   Heart:  Regular rate and rhythm Abdomen:  Soft, nontender and nondistended.   Neuro/Psych:  Alert and cooperative. Normal mood and affect. A and O x 3  Harlin Rain, MD [facility]

## 2023-03-31 ENCOUNTER — Telehealth: Payer: Self-pay | Admitting: *Deleted

## 2023-03-31 NOTE — Telephone Encounter (Signed)
  Follow up Call-     03/30/2023    8:45 AM  Call back number  Post procedure Call Back phone  # [phone]  Permission to leave phone message Yes     Patient questions:  Do you have a fever, pain , or abdominal swelling? No. Pain Score  0 *  Have you tolerated food without any problems? Yes.    Have you been able to return to your normal activities? Yes.    Do you have any questions about your discharge instructions: Diet   No. Medications  No. Follow up visit  No.  Do you have questions or concerns about your Care? No.  Actions: * If pain score is 4 or above: No action needed, pain <4.

## 2023-04-11 ENCOUNTER — Inpatient Hospital Stay: Admit: 2023-04-11 | Payer: PRIVATE HEALTH INSURANCE | Admitting: Internal Medicine

## 2023-04-11 ENCOUNTER — Encounter: Admit: 2023-04-11 | Payer: PRIVATE HEALTH INSURANCE | Primary: Internal Medicine

## 2023-04-21 ENCOUNTER — Encounter: Admit: 2023-04-21 | Payer: PRIVATE HEALTH INSURANCE | Primary: Internal Medicine

## 2023-04-21 ENCOUNTER — Encounter: Admit: 2023-04-21 | Payer: PRIVATE HEALTH INSURANCE | Attending: Cardiovascular Disease | Primary: Internal Medicine

## 2023-04-21 DIAGNOSIS — Z95810 Presence of automatic (implantable) cardiac defibrillator: Secondary | ICD-10-CM

## 2023-05-27 ENCOUNTER — Other Ambulatory Visit: Payer: Self-pay | Admitting: Pharmacist

## 2023-05-27 DIAGNOSIS — I10 Essential (primary) hypertension: Secondary | ICD-10-CM

## 2023-06-06 ENCOUNTER — Other Ambulatory Visit: Payer: Self-pay

## 2023-06-06 DIAGNOSIS — J449 Chronic obstructive pulmonary disease, unspecified: Secondary | ICD-10-CM

## 2023-06-06 MED ORDER — ALBUTEROL SULFATE HFA 108 (90 BASE) MCG/ACT IN AERS: 2.0000 | INHALATION_SPRAY | Freq: Four times a day (QID) | RESPIRATORY_TRACT | 0 refills | Status: AC | PRN

## 2023-06-06 MED ORDER — ANORO ELLIPTA 62.5-25 MCG/ACT IN AEPB: 1.0000 | INHALATION_SPRAY | Freq: Every day | RESPIRATORY_TRACT | 3 refills | Status: DC

## 2023-06-15 NOTE — Progress Notes (Unsigned)
    SUBJECTIVE:   CHIEF COMPLAINT: check up and back pain HPI:   [name] is a 63 y.o.  with history notable for HTN presenting for back pain.   Reports 3 weeks of overall body pain. Has a history of chronic low back pain but this is worse. Denies trauma. Describes low back pain, aching of anterior thighs, and knees. No falls. The patient denies anesthesia in the saddle area, bowel/bladder incontinence, fever.  Taking meloxicam leftover from Sports Medicine which does help Also taking Tylenol Reports work has increased in stress--lifted 8 30 pound chicken wing boxes at start of this No other symptoms  HTN Reports compliance with BP medications. No side effects. Home readings (cuff validated)  130/60s usually. No HA, CP, dyspnea.   PERTINENT  PMH / PSH/Family/Social History : cousin with lupus  OBJECTIVE:   BP 137/63   Pulse 82   Ht 5\' 8"  (1.727 m)   Wt 164 lb 3.2 oz (74.5 kg)   LMP 04/12/2004   SpO2 100%   BMI 24.97 kg/m   Today's weight:  Last Weight  Most recent update: 06/16/2023  8:32 AM    Weight  74.5 kg (164 lb 3.2 oz)            Review of prior weights: [employer]   06/16/23 0831  Weight: 164 lb 3.2 oz (74.5 kg)  RRR Lungs clear  Back exam No skin changes No rash TTP along bilateral paraspinal muscles Pain with forward flexion Good extension 2+ reflexes of patella Preserved strength   ASSESSMENT/PLAN:   Assessment & Plan Low back pain, unspecified back pain laterality, unspecified chronicity, unspecified whether sciatica present Most likely cause is osteoarthritis No signs of cauda equina, exam reassuring May also be myofascial pain Rx volataren gel Referred to PT Recommended stretches Work note given until she improves and see PT  Myalgia Query if due to statin? Unlikely as not recently started Check CK, CMP Encounter for immunization PCV 20  Primary hypertension Home readings at goal Given taking meloxicam, may be slightly  elevated today Continue current therapy    Terisa Starr, MD  Family Medicine Teaching Service  [facility]

## 2023-06-16 ENCOUNTER — Other Ambulatory Visit: Payer: Self-pay | Admitting: Family Medicine

## 2023-06-16 ENCOUNTER — Encounter: Payer: Self-pay | Admitting: Family Medicine

## 2023-06-16 ENCOUNTER — Ambulatory Visit (INDEPENDENT_AMBULATORY_CARE_PROVIDER_SITE_OTHER): Payer: Medicare HMO | Admitting: Family Medicine

## 2023-06-16 ENCOUNTER — Ambulatory Visit
Admission: RE | Admit: 2023-06-16 | Discharge: 2023-06-16 | Disposition: A | Discharge status: Home / Self Care | Source: Ambulatory Visit | Attending: Family Medicine | Admitting: Family Medicine

## 2023-06-16 VITALS — BP 137/63 | HR 82 | Ht 68.0 in | Wt 164.2 lb

## 2023-06-16 DIAGNOSIS — Z1231 Encounter for screening mammogram for malignant neoplasm of breast: Secondary | ICD-10-CM

## 2023-06-16 DIAGNOSIS — Z23 Encounter for immunization: Secondary | ICD-10-CM

## 2023-06-16 DIAGNOSIS — M545 Low back pain, unspecified: Secondary | ICD-10-CM

## 2023-06-16 DIAGNOSIS — I10 Essential (primary) hypertension: Secondary | ICD-10-CM | POA: Diagnosis not present

## 2023-06-16 DIAGNOSIS — M791 Myalgia, unspecified site: Secondary | ICD-10-CM

## 2023-06-16 DIAGNOSIS — M25521 Pain in right elbow: Secondary | ICD-10-CM

## 2023-06-16 MED ORDER — DICLOFENAC SODIUM 2 % EX SOLN: 2.0000 | Freq: Two times a day (BID) | CUTANEOUS | 3 refills | Status: AC | PRN

## 2023-06-16 MED ORDER — DICLOFENAC SODIUM 2 % EX SOLN: 2.0000 | Freq: Two times a day (BID) | CUTANEOUS | 3 refills | Status: DC | PRN

## 2023-06-16 NOTE — Patient Instructions (Signed)
 It was wonderful to see you today.  Please bring ALL of your medications with you to every visit.   Today we talked about:  -- [place] work with your health  - An x-ray was ordered for you---you do not need an appointment to have this completed.  I recommend going to [facility] [address] [place]  If the results are normal,I will send you a letter  I will call you with results if anything is abnormal    For pain -Voltaren gel over the counter--use up to four times per day - Tylenol TWO of the 500 mg tablets three times a day   We will get some blood work today  I have referred you to Physical Therapy  to further evaluate your concern. If you do not received a phone call about this appointment within 3-4 weeks, please call our office back at [phone]. Clemencia Course coordinates our referrals and can assist you in this.    Please follow up in 3 months OR IF YOUR PAIN IS NOT BETTER RETURN IN 2-3 weeks   Thank you for choosing [facility].   Please call [phone] with any questions about today's appointment.  Please be sure to schedule follow up at the front  desk before you leave today.   Terisa Starr, MD  Family Medicine

## 2023-06-16 NOTE — Assessment & Plan Note (Addendum)
 Home readings at goal Given taking meloxicam, may be slightly elevated today Continue current therapy

## 2023-06-17 ENCOUNTER — Other Ambulatory Visit (HOSPITAL_COMMUNITY): Payer: Self-pay

## 2023-06-17 ENCOUNTER — Telehealth: Payer: Self-pay

## 2023-06-17 ENCOUNTER — Encounter: Payer: Self-pay | Admitting: Family Medicine

## 2023-06-17 LAB — COMPREHENSIVE METABOLIC PANEL
ALT: 12 IU/L (ref 0–32)
AST: 16 IU/L (ref 0–40)
Albumin: 4.5 g/dL (ref 3.9–4.9)
Alkaline Phosphatase: 94 IU/L (ref 44–121)
BUN/Creatinine Ratio: 20 (ref 12–28)
BUN: 16 mg/dL (ref 8–27)
Bilirubin Total: 0.3 mg/dL (ref 0.0–1.2)
CO2: 23 mmol/L (ref 20–29)
Calcium: 9.7 mg/dL (ref 8.7–10.3)
Chloride: 103 mmol/L (ref 96–106)
Creatinine, Ser: 0.79 mg/dL (ref 0.57–1.00)
Globulin, Total: 2.4 g/dL (ref 1.5–4.5)
Glucose: 100 mg/dL — ABNORMAL HIGH (ref 70–99)
Potassium: 4.1 mmol/L (ref 3.5–5.2)
Sodium: 142 mmol/L (ref 134–144)
Total Protein: 6.9 g/dL (ref 6.0–8.5)
eGFR: 85 mL/min/{1.73_m2} (ref >59)

## 2023-06-17 LAB — CK: Total CK: 184 U/L — ABNORMAL HIGH (ref 32–182)

## 2023-06-17 MED ORDER — DICLOFENAC SODIUM 1 % EX GEL: 4.0000 g | Freq: Four times a day (QID) | CUTANEOUS | 3 refills | Status: AC

## 2023-06-17 NOTE — Telephone Encounter (Signed)
 Pharmacy Patient Advocate Encounter   Received notification from CoverMyMeds that prior authorization for Diclofenac Sodium 2% solution is required/requested.   Insurance verification completed.   The patient is insured through [facility] .   Per insurance: The patient's drug benefit plan provides coverage for other drugs which may be considered for treating your patient. Can your patient be treated with a formulary drug? Available Formulary Alternatives: diclofenac sodium, diclofenac sodium gel 1%, diclofenac sodium solution 1.5%, ibuprofen, meloxicam tablet, naproxen (except naproxen CR or naproxen suspension) [NOTE: If yes, provide your patient with a new prescription for the formulary product.]*

## 2023-06-17 NOTE — Telephone Encounter (Signed)
 Alternative sent

## 2023-06-21 ENCOUNTER — Encounter: Payer: Self-pay | Admitting: Family Medicine

## 2023-07-08 ENCOUNTER — Ambulatory Visit

## 2023-07-12 ENCOUNTER — Ambulatory Visit
Admission: RE | Admit: 2023-07-12 | Discharge: 2023-07-12 | Disposition: A | Discharge status: Home / Self Care | Source: Ambulatory Visit | Attending: Family Medicine | Admitting: Family Medicine

## 2023-07-12 DIAGNOSIS — Z1231 Encounter for screening mammogram for malignant neoplasm of breast: Secondary | ICD-10-CM

## 2023-07-14 ENCOUNTER — Encounter: Payer: Self-pay | Admitting: Family Medicine

## 2023-07-22 ENCOUNTER — Encounter: Admit: 2023-07-22 | Payer: PRIVATE HEALTH INSURANCE | Primary: Internal Medicine

## 2023-08-17 ENCOUNTER — Other Ambulatory Visit: Payer: Self-pay

## 2023-08-17 DIAGNOSIS — R1013 Epigastric pain: Secondary | ICD-10-CM

## 2023-08-17 MED ORDER — FAMOTIDINE 20 MG PO TABS
20.0000 mg | ORAL_TABLET | Freq: Two times a day (BID) | ORAL | 0 refills | Status: DC
Start: 2023-08-17 — End: 2024-01-31

## 2023-10-10 ENCOUNTER — Other Ambulatory Visit: Payer: Self-pay | Admitting: Family Medicine

## 2023-10-10 DIAGNOSIS — J449 Chronic obstructive pulmonary disease, unspecified: Secondary | ICD-10-CM

## 2023-10-11 MED ORDER — UMECLIDINIUM-VILANTEROL 62.5-25 MCG/ACT IN AEPB: 1.0000 | INHALATION_SPRAY | Freq: Every day | RESPIRATORY_TRACT | 3 refills | Status: DC

## 2023-10-20 NOTE — Progress Notes (Signed)
    SUBJECTIVE:   Chief compliant/HPI: annual examination  [name] is a 63 y.o. who presents today for an annual exam. She has no concerns today  HTN Taking pills as prescribed. No side effects. Home BP are 120-30/70-80.  She is fully retired. Walks a mile or more every day. Lives with son. Continues to drink 3 16 ounce beers each day.   Reports lesion on face growing. No bleeding.  Reports her family thinks she is 'deaf'. Does admit to reading lips. No HA, numbness, weakness, speech change, voice change or tinnitus.  History tabs reviewed and updated .   Review of systems form reviewed and notable for none.   OBJECTIVE:   Vitals:   10/21/23 1019 10/21/23 1021  BP: 132/69 139/62  Pulse:  86  SpO2:     HEENT: EOMI. Sclera without injection or icterus. MMM. External auditory canal examined and WNL. TM normal appearance, no erythema or bulging. Neck: Supple.  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 Normal S1/S2. No murmurs, rubs, or gallops appreciated. Lungs: Clear bilaterally to ascultation.  Abdomen: Normoactive bowel sounds. No tenderness to deep or light palpation. No rebound or guarding.   Psych: Pleasant and appropriate    ASSESSMENT/PLAN:   Assessment & Plan Annual physical exam Discussed alcohol cessation  Annual Examination  See AVS for age appropriate recommendations.   PHQ scorereviewed and discussed.  Blood pressure reviewed and at goal .  Asked about intimate partner violence and resources given as appropriate    Considered the following items based upon USPSTF recommendations: Diabetes screening: ordered Screening for elevated cholesterol: ordered  Cervical cancer screening: prior Pap reviewed, repeat due in 2028 Breast cancer screening: UTD  UTD on colonoscopy  She will get her second Zoster Subjective hearing change Reports family has  noticed this  Referred to Audiology Suspect sensorineural hearing loss  Former heavy tobacco smoker CT ordered, no symptoms  Cutaneous horn Referred to Dermatology given location  Primary hypertension At goal at home  Continue current medications  Alcohol use Counseled on cessation Discussed naltrexone      [name] M Christina Starkey, MD [place] [facility]

## 2023-10-21 ENCOUNTER — Ambulatory Visit: Payer: Self-pay | Admitting: Family Medicine

## 2023-10-21 ENCOUNTER — Ambulatory Visit (INDEPENDENT_AMBULATORY_CARE_PROVIDER_SITE_OTHER): Admitting: Family Medicine

## 2023-10-21 ENCOUNTER — Encounter: Payer: Self-pay | Admitting: Family Medicine

## 2023-10-21 VITALS — BP 139/62 | HR 86 | Ht 68.0 in | Wt 163.8 lb

## 2023-10-21 DIAGNOSIS — Z87891 Personal history of nicotine dependence: Secondary | ICD-10-CM

## 2023-10-21 DIAGNOSIS — L858 Other specified epidermal thickening: Secondary | ICD-10-CM | POA: Diagnosis not present

## 2023-10-21 DIAGNOSIS — H9190 Unspecified hearing loss, unspecified ear: Secondary | ICD-10-CM

## 2023-10-21 DIAGNOSIS — I10 Essential (primary) hypertension: Secondary | ICD-10-CM

## 2023-10-21 DIAGNOSIS — F1090 Alcohol use, unspecified, uncomplicated: Secondary | ICD-10-CM

## 2023-10-21 DIAGNOSIS — Z0000 Encounter for general adult medical examination without abnormal findings: Secondary | ICD-10-CM | POA: Diagnosis not present

## 2023-10-21 LAB — POCT GLYCOSYLATED HEMOGLOBIN (HGB A1C): Hemoglobin A1C: 5.9 % — AB (ref 4.0–5.6)

## 2023-10-21 NOTE — Assessment & Plan Note (Addendum)
 Counseled on cessation Discussed naltrexone

## 2023-10-21 NOTE — Assessment & Plan Note (Signed)
At goal at home. Continue current medications. 

## 2023-10-21 NOTE — Assessment & Plan Note (Signed)
CT ordered, no symptoms

## 2023-10-21 NOTE — Patient Instructions (Addendum)
 It was wonderful to see you today.  Please bring ALL of your medications with you to every visit.   Today we talked about:  -Continue blood pressure pills  - You are doing great with your health  - I recommend cutting back on alcohol  - A medication called Naltrexone could help to cut back on alcohol  I have referred you to Audiology (hearing) and dermatology (horn)  to further evaluate your concern. If you have not received a phone call about this appointment within 3-4 weeks, please call our office back at [phone]. Margit Dimes coordinates our referrals and can assist you in this.    Please follow up in 6 months   Thank you for choosing [facility].   Please call [phone] with any questions about today's appointment.  Please be sure to schedule follow up at the front  desk before you leave today.   Suzann Daring, MD  Family Medicine

## 2023-10-22 LAB — BASIC METABOLIC PANEL WITH GFR
BUN/Creatinine Ratio: 17 (ref 12–28)
BUN: 13 mg/dL (ref 8–27)
CO2: 22 mmol/L (ref 20–29)
Calcium: 9.8 mg/dL (ref 8.7–10.3)
Chloride: 101 mmol/L (ref 96–106)
Creatinine, Ser: 0.77 mg/dL (ref 0.57–1.00)
Glucose: 113 mg/dL — ABNORMAL HIGH (ref 70–99)
Potassium: 4 mmol/L (ref 3.5–5.2)
Sodium: 143 mmol/L (ref 134–144)
eGFR: 87 mL/min/1.73 (ref >59)

## 2023-10-22 LAB — LIPID PANEL
Chol/HDL Ratio: 3.2 ratio (ref 0.0–4.4)
Cholesterol, Total: 162 mg/dL (ref 100–199)
HDL: 51 mg/dL (ref >39)
LDL Chol Calc (NIH): 83 mg/dL (ref 0–99)
Triglycerides: 163 mg/dL — ABNORMAL HIGH (ref 0–149)
VLDL Cholesterol Cal: 28 mg/dL (ref 5–40)

## 2023-11-03 ENCOUNTER — Other Ambulatory Visit: Payer: Self-pay | Admitting: Family Medicine

## 2023-11-03 DIAGNOSIS — I10 Essential (primary) hypertension: Secondary | ICD-10-CM

## 2023-11-03 DIAGNOSIS — J449 Chronic obstructive pulmonary disease, unspecified: Secondary | ICD-10-CM

## 2023-11-03 MED ORDER — UMECLIDINIUM-VILANTEROL 62.5-25 MCG/ACT IN AEPB
1.0000 | INHALATION_SPRAY | Freq: Every day | RESPIRATORY_TRACT | 3 refills | Status: DC
Start: 2023-11-03 — End: 2024-03-05

## 2023-11-03 MED ORDER — HYDROCHLOROTHIAZIDE 25 MG PO TABS: 25.0000 mg | ORAL_TABLET | Freq: Every day | ORAL | 3 refills | Status: AC

## 2023-11-03 NOTE — Telephone Encounter (Signed)
 Patient has requested a medication refill. Last DOS was 10/21/23. Cassell Mary CMA

## 2023-12-13 ENCOUNTER — Other Ambulatory Visit: Payer: Self-pay | Admitting: *Deleted

## 2023-12-13 DIAGNOSIS — E785 Hyperlipidemia, unspecified: Secondary | ICD-10-CM

## 2023-12-14 MED ORDER — ATORVASTATIN CALCIUM 20 MG PO TABS: 20.0000 mg | ORAL_TABLET | Freq: Every day | ORAL | 3 refills | Status: DC

## 2023-12-19 ENCOUNTER — Ambulatory Visit (HOSPITAL_BASED_OUTPATIENT_CLINIC_OR_DEPARTMENT_OTHER)
Admission: RE | Admit: 2023-12-19 | Discharge: 2023-12-19 | Disposition: A | Discharge status: Home / Self Care | Source: Ambulatory Visit | Attending: Family Medicine | Admitting: Family Medicine

## 2023-12-19 DIAGNOSIS — Z87891 Personal history of nicotine dependence: Secondary | ICD-10-CM | POA: Insufficient documentation

## 2024-01-31 ENCOUNTER — Other Ambulatory Visit: Payer: Self-pay | Admitting: *Deleted

## 2024-01-31 ENCOUNTER — Other Ambulatory Visit: Payer: Self-pay

## 2024-01-31 DIAGNOSIS — R1013 Epigastric pain: Secondary | ICD-10-CM

## 2024-01-31 MED ORDER — FAMOTIDINE 20 MG PO TABS: 20.0000 mg | ORAL_TABLET | Freq: Two times a day (BID) | ORAL | 2 refills | Status: AC

## 2024-01-31 MED ORDER — FAMOTIDINE 20 MG PO TABS: 20.0000 mg | ORAL_TABLET | Freq: Two times a day (BID) | ORAL | 2 refills | Status: DC

## 2024-03-02 NOTE — Assessment & Plan Note (Signed)
 CT due in September 2026

## 2024-03-02 NOTE — Progress Notes (Signed)
" ° ° °  SUBJECTIVE:   CHIEF COMPLAINT: leg/back pain HPI:   [name] is a 63 y.o.  with history notable for HTN, tobacco use in past, and HTN presenting with leg and back pain   Discussed the use of AI scribe software for clinical note transcription with the patient, who gave verbal consent to proceed.  History of Present Illness  Musculoskeletal pain and stiffness - Worsening back and leg pain, described as pressure around the back, especially when standing, making her bed, or washing dishes - Pain primarily affects the left side and occurs while walking - Stiffness after prolonged sitting - Unable to stand for long periods due to pain - No history of falls, but concerns about falling due to pain and stiffness - Nighttime aching in the calves - Legs feel tired at night, necessitating movement - Symptoms do not disturb sleep - The patient denies anesthesia in the saddle area, bowel/bladder incontinence, fever.  - Uses Tylenol  for pain management - Tylenol  is not effective for current symptoms  HTN - Taking medications as prescribe - Home readings 124/80s! - No chest pain or dyspnea     PERTINENT  PMH / PSH/Family/Social History : HTN  OBJECTIVE:   BP 129/73   Pulse 89   Ht 5' 8 (1.727 m)   Wt 170 lb 12.8 oz (77.5 kg)   LMP 04/12/2004   SpO2 100%   BMI 25.97 kg/m   Today's weight:  Last Weight  Most recent update: 03/05/2024  9:58 AM    Weight  77.5 kg (170 lb 12.8 oz)            Review of prior weights: [employer]   03/05/24 0958  Weight: 170 lb 12.8 oz (77.5 kg)   RRR Lungs clear bilaterally  Back exam Slightly antalgic gait with kyphosis Reduced forward flexion due to pain Some pain with extension  5/5 proximal muscle strength 2+ patellar reflexes   ASSESSMENT/PLAN:   Assessment & Plan Need for shingles vaccine Rx Shingrix   Primary hypertension Hypertension, unspecified type At gaol, continue current medications  Former heavy  tobacco smoker CT due in September 2026 Low back pain, unspecified back pain laterality, unspecified chronicity, unspecified whether sciatica present Bilateral leg pain Strongly suspect primarily DJD of back ? If component of spinal stenosis  With leg cramps, this is conflicting history  Certainly at risk for PAD - referral to [facility] for ABIs - Will obtain MRI lumbar spine Chronic obstructive pulmonary disease, unspecified COPD type (HCC) Refilled inhaler  Dyslipidemia Refilled statin   HCM Declined COVID Received flu earlier in season    Suzann Daring, MD  Family Medicine Teaching Service  [facility]   "

## 2024-03-05 ENCOUNTER — Encounter: Payer: Self-pay | Admitting: Family Medicine

## 2024-03-05 ENCOUNTER — Ambulatory Visit (INDEPENDENT_AMBULATORY_CARE_PROVIDER_SITE_OTHER): Admitting: Family Medicine

## 2024-03-05 VITALS — BP 129/73 | HR 89 | Ht 68.0 in | Wt 170.8 lb

## 2024-03-05 DIAGNOSIS — Z23 Encounter for immunization: Secondary | ICD-10-CM | POA: Diagnosis not present

## 2024-03-05 DIAGNOSIS — M79605 Pain in left leg: Secondary | ICD-10-CM

## 2024-03-05 DIAGNOSIS — Z87891 Personal history of nicotine dependence: Secondary | ICD-10-CM | POA: Diagnosis not present

## 2024-03-05 DIAGNOSIS — E785 Hyperlipidemia, unspecified: Secondary | ICD-10-CM

## 2024-03-05 DIAGNOSIS — J449 Chronic obstructive pulmonary disease, unspecified: Secondary | ICD-10-CM

## 2024-03-05 DIAGNOSIS — I10 Essential (primary) hypertension: Secondary | ICD-10-CM

## 2024-03-05 DIAGNOSIS — M545 Low back pain, unspecified: Secondary | ICD-10-CM | POA: Diagnosis not present

## 2024-03-05 DIAGNOSIS — M79604 Pain in right leg: Secondary | ICD-10-CM

## 2024-03-05 LAB — POCT GLYCOSYLATED HEMOGLOBIN (HGB A1C): Hemoglobin A1C: 6.5 % — AB (ref 4.0–5.6)

## 2024-03-05 MED ORDER — METFORMIN HCL ER 500 MG PO TB24
500.0000 mg | ORAL_TABLET | Freq: Every day | ORAL | 3 refills | Status: AC
Start: 2024-03-05 — End: ?

## 2024-03-05 MED ORDER — UMECLIDINIUM-VILANTEROL 62.5-25 MCG/ACT IN AEPB: 1.0000 | INHALATION_SPRAY | Freq: Every day | RESPIRATORY_TRACT | 3 refills | Status: AC

## 2024-03-05 MED ORDER — SHINGRIX 50 MCG/0.5ML IM SUSR
INTRAMUSCULAR | 1 refills | Status: AC
Start: 2024-03-05 — End: ?

## 2024-03-05 MED ORDER — AMLODIPINE BESYLATE 2.5 MG PO TABS: 2.5000 mg | ORAL_TABLET | Freq: Every day | ORAL | 3 refills | Status: AC

## 2024-03-05 MED ORDER — ATORVASTATIN CALCIUM 20 MG PO TABS: 20.0000 mg | ORAL_TABLET | Freq: Every day | ORAL | 3 refills | Status: AC

## 2024-03-05 NOTE — Assessment & Plan Note (Signed)
 At gaol, continue current medications

## 2024-03-05 NOTE — Patient Instructions (Signed)
 It was wonderful to see you today.  Please bring ALL of your medications with you to every visit.   Today we talked about:   Your home blood pressures look great  Your goal is <130/80   I think you do have arthritis of your back  However, this could be due to poor circulation  I have referred you to [facility] to further evaluate your concern. If you have not received a phone call about this appointment within 3-4 weeks, please call our office back at [phone]. Margit Dimes coordinates our referrals and can assist you in this.    You will be called by the Physical Therapists   You will be called about the time/date of your MRI     Please follow up in 3-6 months   Thank you for choosing [facility].   Please call [phone] with any questions about today's appointment.  Please be sure to schedule follow up at the front  desk before you leave today.   Suzann Daring, MD  Family Medicine

## 2024-03-05 NOTE — Assessment & Plan Note (Signed)
 Refilled inhaler

## 2024-03-14 ENCOUNTER — Ambulatory Visit (HOSPITAL_COMMUNITY)

## 2024-03-16 ENCOUNTER — Other Ambulatory Visit: Payer: Self-pay

## 2024-03-16 DIAGNOSIS — I739 Peripheral vascular disease, unspecified: Secondary | ICD-10-CM

## 2024-03-24 ENCOUNTER — Inpatient Hospital Stay: Admission: RE | Admit: 2024-03-24 | Discharge: 2024-03-24 | Attending: Family Medicine

## 2024-03-24 DIAGNOSIS — M545 Low back pain, unspecified: Secondary | ICD-10-CM

## 2024-03-29 ENCOUNTER — Ambulatory Visit: Payer: Self-pay | Admitting: Family Medicine

## 2024-03-29 DIAGNOSIS — M545 Low back pain, unspecified: Secondary | ICD-10-CM

## 2024-03-29 NOTE — Telephone Encounter (Signed)
 Called with results. Referred to spine surgery

## 2024-04-18 NOTE — Progress Notes (Signed)
 "  Patient ID: [name], female   DOB: [birth year], 64 y.o.   MRN: [id number]  Reason for Consult: No chief complaint on file.   Referred by [name], MD  Subjective:     HPI [name] is a 64 y.o. female presenting for evaluation of right-sided leg pain.  She reports her right leg pain has been going on for about 2 months and she has tightness and aching in her hamstring that radiates down to her calf.  She also has some pain and numbness in the bottom of her right foot specifically at night.  There is nothing that instigates or worsens this pain she.  She reports that it happens randomly. She is a former smoker but quit in 2018.  Past Medical History:  Diagnosis Date   Arthritis    COPD (chronic obstructive pulmonary disease) (HCC)    Hyperlipidemia    Hypertension 2005   Family History  Problem Relation Age of Onset   Heart disease Mother    Diabetes Mellitus II Mother    Hypertension Mother    Prostate cancer Father    Heart disease Father    Hyperlipidemia Sister    Diabetes Mellitus II Brother    Hypertension Brother    Diabetes Mellitus II Brother    Breast cancer Neg Hx    Colon cancer Neg Hx    Esophageal cancer Neg Hx    Rectal cancer Neg Hx    Stomach cancer Neg Hx    Colon polyps Neg Hx    Past Surgical History:  Procedure Laterality Date   AXILLARY LYMPH NODE BIOPSY     BREAST BIOPSY     BREAST EXCISIONAL BIOPSY Left    CESAREAN SECTION     x 1   DIAGNOSTIC LAPAROSCOPY     for persistant right ovarian cyst    Short Social History:  Social History   Tobacco Use   Smoking status: Former    Current packs/day: 0.00    Average packs/day: 0.3 packs/day for 39.7 years (9.9 ttl pk-yrs)    Types: Cigarettes    Start date: 04/12/1977    Quit date: 12/20/2016    Years since quitting: 7.3   Smokeless tobacco: Never   Tobacco comments:    Committed to long-term quit 10/10 on 02/28/17  Substance Use Topics   Alcohol use: Yes     Alcohol/week: 10.0 standard drinks of alcohol    Types: 10 Standard drinks or equivalent per week    Comment: 2 drinks a day    Allergies[1]  Current Outpatient Medications  Medication Sig Dispense Refill   acetaminophen  (TYLENOL ) 500 MG tablet Take 1,000 mg by mouth every 6 (six) hours as needed.     albuterol  (VENTOLIN  HFA) 108 (90 Base) MCG/ACT inhaler Inhale 2 puffs into the lungs every 6 (six) hours as needed for wheezing or shortness of breath. 8 g 0   amLODipine  (NORVASC ) 2.5 MG tablet Take 1 tablet (2.5 mg total) by mouth daily. 90 tablet 3   atorvastatin  (LIPITOR) 20 MG tablet Take 1 tablet (20 mg total) by mouth daily. 90 tablet 3   diclofenac  Sodium (PENNSAID ) 2 % SOLN Apply 2 Pump (40 mg total) topically 2 (two) times daily as needed. Apply to low back 112 g 3   diclofenac  Sodium (VOLTAREN ) 1 % GEL Apply 4 g topically 4 (four) times daily. Apply to low back 350 g 3   famotidine  (PEPCID ) 20 MG tablet Take 1 tablet (  20 mg total) by mouth 2 (two) times daily. 180 tablet 2   hydrochlorothiazide  (HYDRODIURIL ) 25 MG tablet Take 1 tablet (25 mg total) by mouth daily. 90 tablet 3   metFORMIN  (GLUCOPHAGE -XR) 500 MG 24 hr tablet Take 1 tablet (500 mg total) by mouth daily with breakfast. 90 tablet 3   triamcinolone  ointment (KENALOG ) 0.5 % Apply 1 Application topically 2 (two) times daily. Apply to right foot 60 g 1   umeclidinium-vilanterol (ANORO ELLIPTA ) 62.5-25 MCG/ACT AEPB Inhale 1 puff into the lungs daily. 60 each 3   Zoster Vaccine Adjuvanted (SHINGRIX ) injection Administer Shingrix  vaccination now and repeat in two months 1 each 1   No current facility-administered medications for this visit.    REVIEW OF SYSTEMS  All other systems were reviewed and are negative     Objective:  Objective   There were no vitals filed for this visit. There is no height or weight on file to calculate BMI.  Physical Exam General: no acute distress Cardiac: hemodynamically stable Abdomen:  non-tender, no pulsatile mass Extremities: no edema, cyanosis or wounds Vascular:   Right: Palpable radial, femoral, DP, PT  Left: Palpable radial, femoral, DP, PT  Data: ABI +---------+------------------+-----+---------+--------+  Right   Rt Pressure (mmHg)IndexWaveform Comment   +---------+------------------+-----+---------+--------+  Brachial 141                                       +---------+------------------+-----+---------+--------+  PTA     167               1.18 triphasic          +---------+------------------+-----+---------+--------+  DP      165               1.17 triphasic          +---------+------------------+-----+---------+--------+  [facility]               0.87 Normal             +---------+------------------+-----+---------+--------+   +---------+------------------+-----+---------+-------+  Left    Lt Pressure (mmHg)IndexWaveform Comment  +---------+------------------+-----+---------+-------+  Brachial 141                                      +---------+------------------+-----+---------+-------+  PTA     164               1.16 triphasic         +---------+------------------+-----+---------+-------+  DP      155               1.10 triphasic         +---------+------------------+-----+---------+-------+  [place]               0.82 Normal            +---------+------------------+-----+---------+-------+   Independently reviewed MRI lumbar spine from December No evidence of AAA  A1c 6.5      Assessment/Plan:   [name] is a 64 y.o. female with lumbar back pain and right leg pain.  I explained that based on her physical exam and ABI she does not have arterial disease.  We discussed that her leg pain is likely stemming from her back pain.  She has been referred to a back specialist and reports she has an appointment coming up.  Follow-up  as needed  Norman GORMAN Serve MD [facility]     [1] No Known Allergies  "

## 2024-04-20 ENCOUNTER — Encounter: Payer: Self-pay | Admitting: Vascular Surgery

## 2024-04-20 ENCOUNTER — Ambulatory Visit (HOSPITAL_COMMUNITY)
Admission: RE | Admit: 2024-04-20 | Discharge: 2024-04-20 | Disposition: A | Source: Ambulatory Visit | Attending: Vascular Surgery

## 2024-04-20 ENCOUNTER — Ambulatory Visit (INDEPENDENT_AMBULATORY_CARE_PROVIDER_SITE_OTHER): Admitting: Vascular Surgery

## 2024-04-20 VITALS — BP 143/82 | HR 98 | Temp 87.0°F | Resp 18 | Ht 68.0 in | Wt 170.2 lb

## 2024-04-20 DIAGNOSIS — I739 Peripheral vascular disease, unspecified: Secondary | ICD-10-CM | POA: Diagnosis present

## 2024-04-20 DIAGNOSIS — Z87891 Personal history of nicotine dependence: Secondary | ICD-10-CM | POA: Insufficient documentation

## 2024-04-20 LAB — VAS US ABI WITH/WO TBI
Left ABI: 1.16
Right ABI: 1.18

## 2024-05-10 NOTE — Progress Notes (Unsigned)
 "  Referring Physician:  [name], MD [address] [place],  [place] [address]  Primary Physician:  [name], MD  History of Present Illness: 05/10/2024*** Ms. Chanon Loney has a history of HTN, COPD, alcohol use, hyperlipidemia.   Saw vascular on 04/20/24 for right leg pain. Vascular workup was negative and she was referred here for her back.    Back pain Right leg pain? Nighttime aching in calves and legs feel tired at night?  Duration: *** Location: *** Quality: *** Severity: ***  Precipitating: aggravated by *** Modifying factors: made better by *** Weakness: none Timing: ***  Tobacco use: Does not smoke.   Bowel/Bladder Dysfunction: none  Conservative measures:  Physical therapy: *** referral was placed by PCP but not scheduled still per chart [facility] location) Multimodal medical therapy including regular antiinflammatories: *** Tylenol , Voltaren  Injections: *** no epidural steroid injections  Past Surgery: ***no spine surgeries  Dorothe DELENA Chancy has ***no symptoms of cervical myelopathy.  The symptoms are causing a significant impact on the patient's life.   Review of Systems:  A 10 point review of systems is negative, except for the pertinent positives and negatives detailed in the HPI.  Past Medical History: Past Medical History:  Diagnosis Date   Arthritis    COPD (chronic obstructive pulmonary disease) (HCC)    Hyperlipidemia    Hypertension 2005    Past Surgical History: Past Surgical History:  Procedure Laterality Date   AXILLARY LYMPH NODE BIOPSY     BREAST BIOPSY     BREAST EXCISIONAL BIOPSY Left    CESAREAN SECTION     x 1   DIAGNOSTIC LAPAROSCOPY     for persistant right ovarian cyst    Allergies: Allergies as of 05/17/2024   (No Known Allergies)    Medications: Outpatient Encounter Medications as of 05/17/2024  Medication Sig   acetaminophen  (TYLENOL ) 500 MG tablet Take 1,000 mg by mouth every 6 (six) hours as  needed.   albuterol  (VENTOLIN  HFA) 108 (90 Base) MCG/ACT inhaler Inhale 2 puffs into the lungs every 6 (six) hours as needed for wheezing or shortness of breath.   amLODipine  (NORVASC ) 2.5 MG tablet Take 1 tablet (2.5 mg total) by mouth daily.   atorvastatin  (LIPITOR) 20 MG tablet Take 1 tablet (20 mg total) by mouth daily.   diclofenac  Sodium (PENNSAID ) 2 % SOLN Apply 2 Pump (40 mg total) topically 2 (two) times daily as needed. Apply to low back   diclofenac  Sodium (VOLTAREN ) 1 % GEL Apply 4 g topically 4 (four) times daily. Apply to low back   famotidine  (PEPCID ) 20 MG tablet Take 1 tablet (20 mg total) by mouth 2 (two) times daily.   hydrochlorothiazide  (HYDRODIURIL ) 25 MG tablet Take 1 tablet (25 mg total) by mouth daily.   metFORMIN  (GLUCOPHAGE -XR) 500 MG 24 hr tablet Take 1 tablet (500 mg total) by mouth daily with breakfast.   triamcinolone  ointment (KENALOG ) 0.5 % Apply 1 Application topically 2 (two) times daily. Apply to right foot   umeclidinium-vilanterol (ANORO ELLIPTA ) 62.5-25 MCG/ACT AEPB Inhale 1 puff into the lungs daily.   Zoster Vaccine Adjuvanted (SHINGRIX ) injection Administer Shingrix  vaccination now and repeat in two months   No facility-administered encounter medications on file as of 05/17/2024.    Social History: Social History[1]  Family Medical History: Family History  Problem Relation Age of Onset   Heart disease Mother    Diabetes Mellitus II Mother    Hypertension Mother  Prostate cancer Father    Heart disease Father    Hyperlipidemia Sister    Diabetes Mellitus II Brother    Hypertension Brother    Diabetes Mellitus II Brother    Breast cancer Neg Hx    Colon cancer Neg Hx    Esophageal cancer Neg Hx    Rectal cancer Neg Hx    Stomach cancer Neg Hx    Colon polyps Neg Hx     Physical Examination: There were no vitals filed for this visit.  General: Patient is well developed, well nourished, calm, collected, and in no apparent distress.  Attention to examination is appropriate.  Respiratory: Patient is breathing without any difficulty.   NEUROLOGICAL:     Awake, alert, oriented to person, place, and time.  Speech is clear and fluent. Fund of knowledge is appropriate.   Cranial Nerves: Pupils equal round and reactive to light.  Facial tone is symmetric.    *** ROM of cervical spine *** pain *** posterior cervical tenderness. *** tenderness in bilateral trapezial region.   *** ROM of lumbar spine *** pain *** posterior lumbar tenderness.   No abnormal lesions on exposed skin.   Strength: Side Biceps Triceps Deltoid Interossei Grip Wrist Ext. Wrist Flex.  R 5 5 5 5 5 5 5   L 5 5 5 5 5 5 5    Side Iliopsoas Quads Hamstring PF DF EHL  R 5 5 5 5 5 5   L 5 5 5 5 5 5    Reflexes are ***2+ and symmetric at the biceps, brachioradialis, patella and achilles.   Hoffman's is absent.  Clonus is not present.   Bilateral upper and lower extremity sensation is intact to light touch.     Gait is normal.   ***No difficulty with tandem gait.    Medical Decision Making  Imaging: Lumbar MRI dated 03/24/24:  FINDINGS:   BONES AND ALIGNMENT: Normal alignment. Normal vertebral body heights. Bone marrow signal is unremarkable. Moderate type 2 degenerative endplate changes at L5-S1 with associated prominent degenerative endplate osteophytes. No bone marrow edema. There is mild edema adjacent to the L4-L5 facets likely secondary to facet arthrosis.   SPINAL CORD: The conus medullaris extends to the L1 level.   SOFT TISSUES: No paraspinal mass.   T12-L1: At T12-L1 there is no significant spinal canal or foraminal stenosis.   L1-L2: At L1-L2 there is no significant spinal canal or foraminal stenosis, mild bilateral facet arthrosis, and thickening of the ligamentum flavum.   L2-L3: At L2-L3 there is no significant spinal canal stenosis, bilateral facet arthrosis slightly greater on the left, thickening of the ligamentum  flavum, and mild left foraminal stenosis.   L3-L4: At L3-L4 there is no significant spinal canal stenosis or foraminal stenosis, mild to moderate facet arthrosis, and thickening of the ligamentum flavum.   L4-L5: Paracentral at L4-L5 there is a small disc bulge, moderate facet arthrosis, a small facet effusion on the left, thickening of the ligamentum flavum, no significant spinal canal stenosis, and no significant foraminal stenosis.   L5-S1: At L5-S1 there is mild disc height loss, disc desiccation, a small disc bulge, and a broad based central disc protrusion. The disc protrusion contributes to bilateral lateral recess narrowing and contacts the traversing bilateral S1 nerve roots. There is moderate bilateral facet arthrosis and thickening of the ligamentum flavum, no significant spinal canal stenosis, and no significant foraminal stenosis.   IMPRESSION: 1. Broad-based central disc protrusion at L5-S1 with bilateral lateral recess narrowing and  contact of the traversing bilateral S1 nerve roots. 2. No significant spinal canal or foraminal stenosis. 3. Additional degenerative changes as above.   Electronically signed by: Donnice Mania MD 03/27/2024 10:48 PM EST RP Workstation: [id number]     I have personally reviewed the images and agree with the above interpretation.  Assessment and Plan: Ms. Kneip is a pleasant 64 y.o. female has ***  Treatment options discussed with patient and following plan made:   - Order for physical therapy for *** spine ***. Patient to call to schedule appointment. *** - Continue current medications including ***. Reviewed dosing and side effects.  - Prescription for ***. Reviewed dosing and side effects. Take with food.  - Prescription for *** to take prn muscle spasms. Reviewed dosing and side effects. Discussed this can cause drowsiness.  - MRI of *** to further evaluate *** radiculopathy. No improvement time or medications (***).  - Referral  to PMR at [facility] to discuss possible *** injections.  - Will schedule phone visit to review MRI results once I get them back.   I spent a total of *** minutes in face-to-face and non-face-to-face activities related to this patient's care today including review of outside records, review of imaging, review of symptoms, physical exam, discussion of differential diagnosis, discussion of treatment options, and documentation.   Thank you for involving me in the care of this patient.   Glade Boys PA-C Dept. of Neurosurgery      [1]  Social History Tobacco Use   Smoking status: Former    Current packs/day: 0.00    Average packs/day: 0.3 packs/day for 39.7 years (9.9 ttl pk-yrs)    Types: Cigarettes    Start date: 04/12/1977    Quit date: 12/20/2016    Years since quitting: 7.3   Smokeless tobacco: Never   Tobacco comments:    Committed to long-term quit 10/10 on 02/28/17  Vaping Use   Vaping status: Never Used  Substance Use Topics   Alcohol use: Yes    Alcohol/week: 10.0 standard drinks of alcohol    Types: 10 Standard drinks or equivalent per week    Comment: 2 drinks a day   Drug use: No   "

## 2024-05-17 ENCOUNTER — Ambulatory Visit: Admitting: Orthopedic Surgery

## 2024-05-17 NOTE — Progress Notes (Unsigned)
 "  Referring Physician:  [name], MD [address] [place],  [place] [address]  Primary Physician:  [name], MD  History of Present Illness: 05/17/2024*** [name] has a history of HTN, COPD, alcohol use, hyperlipidemia.   Saw vascular on 04/20/24 for right leg pain. Vascular workup was negative and she was referred here for her back.    Back pain Right leg pain? Nighttime aching in calves and legs feel tired at night?  Duration: *** Location: *** Quality: *** Severity: ***  Precipitating: aggravated by *** Modifying factors: made better by *** Weakness: none Timing: ***  Tobacco use: Does not smoke.   Bowel/Bladder Dysfunction: none  Conservative measures:  Physical therapy: *** referral was placed by PCP but not scheduled still per chart [facility]) Multimodal medical therapy including regular antiinflammatories: *** Tylenol , Voltaren  Injections: *** no epidural steroid injections  Past Surgery: ***no spine surgeries  [name] has ***no symptoms of cervical myelopathy.  The symptoms are causing a significant impact on the patient's life.   Review of Systems:  A 10 point review of systems is negative, except for the pertinent positives and negatives detailed in the HPI.  Past Medical History: Past Medical History:  Diagnosis Date   Arthritis    COPD (chronic obstructive pulmonary disease) (HCC)    Hyperlipidemia    Hypertension 2005    Past Surgical History: Past Surgical History:  Procedure Laterality Date   AXILLARY LYMPH NODE BIOPSY     BREAST BIOPSY     BREAST EXCISIONAL BIOPSY Left    CESAREAN SECTION     x 1   DIAGNOSTIC LAPAROSCOPY     for persistant right ovarian cyst    Allergies: Allergies as of 05/25/2024   (No Known Allergies)    Medications: Outpatient Encounter Medications as of 05/25/2024  Medication Sig   acetaminophen  (TYLENOL ) 500 MG tablet Take 1,000 mg by mouth every 6 (six) hours as  needed.   albuterol  (VENTOLIN  HFA) 108 (90 Base) MCG/ACT inhaler Inhale 2 puffs into the lungs every 6 (six) hours as needed for wheezing or shortness of breath.   amLODipine  (NORVASC ) 2.5 MG tablet Take 1 tablet (2.5 mg total) by mouth daily.   atorvastatin  (LIPITOR) 20 MG tablet Take 1 tablet (20 mg total) by mouth daily.   diclofenac  Sodium (PENNSAID ) 2 % SOLN Apply 2 Pump (40 mg total) topically 2 (two) times daily as needed. Apply to low back   diclofenac  Sodium (VOLTAREN ) 1 % GEL Apply 4 g topically 4 (four) times daily. Apply to low back   famotidine  (PEPCID ) 20 MG tablet Take 1 tablet (20 mg total) by mouth 2 (two) times daily.   hydrochlorothiazide  (HYDRODIURIL ) 25 MG tablet Take 1 tablet (25 mg total) by mouth daily.   metFORMIN  (GLUCOPHAGE -XR) 500 MG 24 hr tablet Take 1 tablet (500 mg total) by mouth daily with breakfast.   triamcinolone  ointment (KENALOG ) 0.5 % Apply 1 Application topically 2 (two) times daily. Apply to right foot   umeclidinium-vilanterol (ANORO ELLIPTA ) 62.5-25 MCG/ACT AEPB Inhale 1 puff into the lungs daily.   Zoster Vaccine Adjuvanted (SHINGRIX ) injection Administer Shingrix  vaccination now and repeat in two months   No facility-administered encounter medications on file as of 05/25/2024.    Social History: Social History[1]  Family Medical History: Family History  Problem Relation Age of Onset   Heart disease Mother    Diabetes Mellitus II Mother    Hypertension Mother  Prostate cancer Father    Heart disease Father    Hyperlipidemia Sister    Diabetes Mellitus II Brother    Hypertension Brother    Diabetes Mellitus II Brother    Breast cancer Neg Hx    Colon cancer Neg Hx    Esophageal cancer Neg Hx    Rectal cancer Neg Hx    Stomach cancer Neg Hx    Colon polyps Neg Hx     Physical Examination: There were no vitals filed for this visit.  General: Patient is well developed, well nourished, calm, collected, and in no apparent distress.  Attention to examination is appropriate.  Respiratory: Patient is breathing without any difficulty.   NEUROLOGICAL:     Awake, alert, oriented to person, place, and time.  Speech is clear and fluent. Fund of knowledge is appropriate.   Cranial Nerves: Pupils equal round and reactive to light.  Facial tone is symmetric.    *** ROM of cervical spine *** pain *** posterior cervical tenderness. *** tenderness in bilateral trapezial region.   *** ROM of lumbar spine *** pain *** posterior lumbar tenderness.   No abnormal lesions on exposed skin.   Strength: Side Biceps Triceps Deltoid Interossei Grip Wrist Ext. Wrist Flex.  R 5 5 5 5 5 5 5   L 5 5 5 5 5 5 5    Side Iliopsoas Quads Hamstring PF DF EHL  R 5 5 5 5 5 5   L 5 5 5 5 5 5    Reflexes are ***2+ and symmetric at the biceps, brachioradialis, patella and achilles.   Hoffman's is absent.  Clonus is not present.   Bilateral upper and lower extremity sensation is intact to light touch.     Gait is normal.   ***No difficulty with tandem gait.    Medical Decision Making  Imaging: Lumbar MRI dated 03/24/24:  FINDINGS:   BONES AND ALIGNMENT: Normal alignment. Normal vertebral body heights. Bone marrow signal is unremarkable. Moderate type 2 degenerative endplate changes at L5-S1 with associated prominent degenerative endplate osteophytes. No bone marrow edema. There is mild edema adjacent to the L4-L5 facets likely secondary to facet arthrosis.   SPINAL CORD: The conus medullaris extends to the L1 level.   SOFT TISSUES: No paraspinal mass.   T12-L1: At T12-L1 there is no significant spinal canal or foraminal stenosis.   L1-L2: At L1-L2 there is no significant spinal canal or foraminal stenosis, mild bilateral facet arthrosis, and thickening of the ligamentum flavum.   L2-L3: At L2-L3 there is no significant spinal canal stenosis, bilateral facet arthrosis slightly greater on the left, thickening of the ligamentum  flavum, and mild left foraminal stenosis.   L3-L4: At L3-L4 there is no significant spinal canal stenosis or foraminal stenosis, mild to moderate facet arthrosis, and thickening of the ligamentum flavum.   L4-L5: Paracentral at L4-L5 there is a small disc bulge, moderate facet arthrosis, a small facet effusion on the left, thickening of the ligamentum flavum, no significant spinal canal stenosis, and no significant foraminal stenosis.   L5-S1: At L5-S1 there is mild disc height loss, disc desiccation, a small disc bulge, and a broad based central disc protrusion. The disc protrusion contributes to bilateral lateral recess narrowing and contacts the traversing bilateral S1 nerve roots. There is moderate bilateral facet arthrosis and thickening of the ligamentum flavum, no significant spinal canal stenosis, and no significant foraminal stenosis.   IMPRESSION: 1. Broad-based central disc protrusion at L5-S1 with bilateral lateral recess narrowing and  contact of the traversing bilateral S1 nerve roots. 2. No significant spinal canal or foraminal stenosis. 3. Additional degenerative changes as above.   Electronically signed by: Donnice Mania MD 03/27/2024 10:48 PM EST RP Workstation: [id number]     I have personally reviewed the images and agree with the above interpretation.  Assessment and Plan: [name] is a pleasant 64 y.o. female has ***  Treatment options discussed with patient and following plan made:   - Order for physical therapy for *** spine ***. Patient to call to schedule appointment. *** - Continue current medications including ***. Reviewed dosing and side effects.  - Prescription for ***. Reviewed dosing and side effects. Take with food.  - Prescription for *** to take prn muscle spasms. Reviewed dosing and side effects. Discussed this can cause drowsiness.  - MRI of *** to further evaluate *** radiculopathy. No improvement time or medications (***).  - Referral  to PMR at [facility] to discuss possible *** injections.  - Will schedule phone visit to review MRI results once I get them back.   I spent a total of *** minutes in face-to-face and non-face-to-face activities related to this patient's care today including review of outside records, review of imaging, review of symptoms, physical exam, discussion of differential diagnosis, discussion of treatment options, and documentation.   Thank you for involving me in the care of this patient.   Glade Boys PA-C Dept. of Neurosurgery      [1]  Social History Tobacco Use   Smoking status: Former    Current packs/day: 0.00    Average packs/day: 0.3 packs/day for 39.7 years (9.9 ttl pk-yrs)    Types: Cigarettes    Start date: 04/12/1977    Quit date: 12/20/2016    Years since quitting: 7.4   Smokeless tobacco: Never   Tobacco comments:    Committed to long-term quit 10/10 on 02/28/17  Vaping Use   Vaping status: Never Used  Substance Use Topics   Alcohol use: Yes    Alcohol/week: 10.0 standard drinks of alcohol    Types: 10 Standard drinks or equivalent per week    Comment: 2 drinks a day   Drug use: No   "

## 2024-05-18 NOTE — Progress Notes (Unsigned)
" ° ° °  SUBJECTIVE:   CHIEF COMPLAINT: A1C HPI:   [name] is a 64 y.o.  with history notable for elevated A1C at 6.5 (likely has type 2 diabetes as had prior), back pain, and HTN  presenting for ***.   Discussed the use of AI scribe software for clinical note transcription with the patient, who gave verbal consent to proceed.  History of Present Illness      PERTINENT  PMH / PSH/Family/Social History : HTN, former smoker  OBJECTIVE:   LMP 04/12/2004   Today's weight:  Review of prior weights: There were no vitals filed for this visit.   Cardiac: Regular rate and rhythm. Normal S1/S2. No murmurs, rubs, or gallops appreciated. Lungs: Clear bilaterally to ascultation.  Abdomen: Normoactive bowel sounds. No tenderness to deep or light palpation. No rebound or guarding.  ***  Psych: Pleasant and appropriate    ASSESSMENT/PLAN:   Assessment & Plan Primary hypertension  Need for shingles vaccine     Suzann Daring, MD  Family Medicine Teaching Service  [facility]   "

## 2024-05-21 ENCOUNTER — Ambulatory Visit: Admitting: Family Medicine

## 2024-05-21 DIAGNOSIS — Z23 Encounter for immunization: Secondary | ICD-10-CM

## 2024-05-21 DIAGNOSIS — I10 Essential (primary) hypertension: Secondary | ICD-10-CM

## 2024-05-25 ENCOUNTER — Ambulatory Visit: Admitting: Orthopedic Surgery

## 2024-06-20 ENCOUNTER — Ambulatory Visit: Admitting: Dermatology
